# Patient Record
Sex: Female | Born: 1945 | Race: White | Hispanic: No | Smoking: Former smoker
Health system: Southern US, Community
[De-identification: ages and names within clinical notes are randomized; demographics above are authoritative.]

## PROBLEM LIST (undated history)

## (undated) DIAGNOSIS — F329 Major depressive disorder, single episode, unspecified: Secondary | ICD-10-CM

## (undated) DIAGNOSIS — M8580 Other specified disorders of bone density and structure, unspecified site: Secondary | ICD-10-CM

## (undated) DIAGNOSIS — J069 Acute upper respiratory infection, unspecified: Secondary | ICD-10-CM

## (undated) DIAGNOSIS — E049 Nontoxic goiter, unspecified: Secondary | ICD-10-CM

## (undated) DIAGNOSIS — J330 Polyp of nasal cavity: Secondary | ICD-10-CM

## (undated) DIAGNOSIS — M7072 Other bursitis of hip, left hip: Secondary | ICD-10-CM

## (undated) DIAGNOSIS — F419 Anxiety disorder, unspecified: Secondary | ICD-10-CM

## (undated) DIAGNOSIS — M1990 Unspecified osteoarthritis, unspecified site: Secondary | ICD-10-CM

## (undated) DIAGNOSIS — F32A Depression, unspecified: Secondary | ICD-10-CM

## (undated) DIAGNOSIS — N6019 Diffuse cystic mastopathy of unspecified breast: Secondary | ICD-10-CM

## (undated) DIAGNOSIS — E785 Hyperlipidemia, unspecified: Secondary | ICD-10-CM

## (undated) DIAGNOSIS — M7051 Other bursitis of knee, right knee: Secondary | ICD-10-CM

## (undated) DIAGNOSIS — J449 Chronic obstructive pulmonary disease, unspecified: Secondary | ICD-10-CM

## (undated) HISTORY — DX: Nontoxic goiter, unspecified: E04.9

## (undated) HISTORY — PX: OOPHORECTOMY: SHX86

## (undated) HISTORY — DX: Acute upper respiratory infection, unspecified: J06.9

## (undated) HISTORY — DX: Unspecified osteoarthritis, unspecified site: M19.90

## (undated) HISTORY — DX: Diffuse cystic mastopathy of unspecified breast: N60.19

## (undated) HISTORY — DX: Hyperlipidemia, unspecified: E78.5

## (undated) HISTORY — PX: OVARIAN CYST REMOVAL: SHX89

## (undated) HISTORY — DX: Other specified disorders of bone density and structure, unspecified site: M85.80

## (undated) HISTORY — PX: MOLE REMOVAL: SHX2046

## (undated) HISTORY — PX: BREAST BIOPSY: SHX20

---
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
## 2006-10-03 DIAGNOSIS — I10 Essential (primary) hypertension: Secondary | ICD-10-CM

## 2006-10-03 HISTORY — DX: Essential (primary) hypertension: I10

## 2007-09-03 ENCOUNTER — Other Ambulatory Visit: Admission: RE | Admit: 2007-09-03 | Discharge: 2007-09-03 | Payer: Self-pay | Admitting: Internal Medicine

## 2007-09-03 LAB — HM PAP SMEAR

## 2007-09-04 ENCOUNTER — Encounter: Admission: RE | Admit: 2007-09-04 | Discharge: 2007-09-04 | Payer: Self-pay | Admitting: Internal Medicine

## 2007-09-12 ENCOUNTER — Other Ambulatory Visit: Admission: RE | Admit: 2007-09-12 | Discharge: 2007-09-12 | Payer: Self-pay | Admitting: Radiology

## 2007-09-12 ENCOUNTER — Encounter (INDEPENDENT_AMBULATORY_CARE_PROVIDER_SITE_OTHER): Payer: Self-pay | Admitting: Diagnostic Radiology

## 2007-09-12 ENCOUNTER — Encounter: Admission: RE | Admit: 2007-09-12 | Discharge: 2007-09-12 | Payer: Self-pay | Admitting: Internal Medicine

## 2007-10-02 ENCOUNTER — Encounter: Admission: RE | Admit: 2007-10-02 | Discharge: 2007-10-02 | Payer: Self-pay | Admitting: Internal Medicine

## 2007-10-10 ENCOUNTER — Encounter: Admission: RE | Admit: 2007-10-10 | Discharge: 2007-10-10 | Payer: Self-pay | Admitting: Internal Medicine

## 2007-10-23 ENCOUNTER — Encounter: Admission: RE | Admit: 2007-10-23 | Discharge: 2007-10-23 | Payer: Self-pay | Admitting: Internal Medicine

## 2008-10-10 ENCOUNTER — Ambulatory Visit: Payer: Self-pay | Admitting: Internal Medicine

## 2008-11-25 ENCOUNTER — Ambulatory Visit: Payer: Self-pay | Admitting: Internal Medicine

## 2009-04-09 ENCOUNTER — Ambulatory Visit: Payer: Self-pay | Admitting: Internal Medicine

## 2010-01-04 ENCOUNTER — Ambulatory Visit: Payer: Self-pay | Admitting: Internal Medicine

## 2010-02-02 ENCOUNTER — Ambulatory Visit: Payer: Self-pay | Admitting: Internal Medicine

## 2010-07-06 ENCOUNTER — Ambulatory Visit: Payer: Self-pay | Admitting: Internal Medicine

## 2010-07-27 ENCOUNTER — Ambulatory Visit: Payer: Self-pay | Admitting: Internal Medicine

## 2010-08-17 ENCOUNTER — Ambulatory Visit: Payer: Self-pay | Admitting: Internal Medicine

## 2010-09-16 ENCOUNTER — Ambulatory Visit: Payer: Self-pay | Admitting: Internal Medicine

## 2010-10-28 ENCOUNTER — Ambulatory Visit
Admission: RE | Admit: 2010-10-28 | Discharge: 2010-10-28 | Payer: Self-pay | Source: Home / Self Care | Attending: Internal Medicine | Admitting: Internal Medicine

## 2010-12-10 ENCOUNTER — Ambulatory Visit (INDEPENDENT_AMBULATORY_CARE_PROVIDER_SITE_OTHER): Payer: Medicare Other | Admitting: Internal Medicine

## 2010-12-10 DIAGNOSIS — J309 Allergic rhinitis, unspecified: Secondary | ICD-10-CM

## 2010-12-10 DIAGNOSIS — I10 Essential (primary) hypertension: Secondary | ICD-10-CM

## 2011-06-10 ENCOUNTER — Encounter: Payer: Self-pay | Admitting: Internal Medicine

## 2011-06-14 ENCOUNTER — Other Ambulatory Visit: Payer: Medicare Other | Admitting: Internal Medicine

## 2011-06-14 DIAGNOSIS — E785 Hyperlipidemia, unspecified: Secondary | ICD-10-CM

## 2011-06-14 DIAGNOSIS — E039 Hypothyroidism, unspecified: Secondary | ICD-10-CM

## 2011-06-14 DIAGNOSIS — Z0000 Encounter for general adult medical examination without abnormal findings: Secondary | ICD-10-CM

## 2011-06-14 LAB — COMPREHENSIVE METABOLIC PANEL
Alkaline Phosphatase: 77 U/L (ref 39–117)
CO2: 27 mEq/L (ref 19–32)
Calcium: 9.5 mg/dL (ref 8.4–10.5)
Chloride: 106 mEq/L (ref 96–112)
Creat: 0.86 mg/dL (ref 0.50–1.10)
Total Protein: 6.9 g/dL (ref 6.0–8.3)

## 2011-06-14 LAB — CBC WITH DIFFERENTIAL/PLATELET
Basophils Relative: 1 % (ref 0–1)
Eosinophils Absolute: 0.3 10*3/uL (ref 0.0–0.7)
Hemoglobin: 14.5 g/dL (ref 12.0–15.0)
Lymphs Abs: 2.5 10*3/uL (ref 0.7–4.0)
MCH: 29.4 pg (ref 26.0–34.0)
MCV: 91.1 fL (ref 78.0–100.0)
Monocytes Absolute: 0.7 10*3/uL (ref 0.1–1.0)
Neutro Abs: 5.4 10*3/uL (ref 1.7–7.7)
Neutrophils Relative %: 61 % (ref 43–77)
Platelets: 284 10*3/uL (ref 150–400)
RBC: 4.93 MIL/uL (ref 3.87–5.11)
RDW: 14.6 % (ref 11.5–15.5)
WBC: 8.9 10*3/uL (ref 4.0–10.5)

## 2011-06-14 LAB — LIPID PANEL
Cholesterol: 207 mg/dL — ABNORMAL HIGH (ref 0–200)
LDL Cholesterol: 132 mg/dL — ABNORMAL HIGH (ref 0–99)

## 2011-06-16 ENCOUNTER — Encounter: Payer: Self-pay | Admitting: Internal Medicine

## 2011-06-16 ENCOUNTER — Ambulatory Visit (INDEPENDENT_AMBULATORY_CARE_PROVIDER_SITE_OTHER): Payer: Medicare Other | Admitting: Internal Medicine

## 2011-06-16 VITALS — BP 124/72 | HR 72 | Temp 97.8°F | Ht 64.0 in | Wt 140.0 lb

## 2011-06-16 DIAGNOSIS — G43909 Migraine, unspecified, not intractable, without status migrainosus: Secondary | ICD-10-CM | POA: Insufficient documentation

## 2011-06-16 DIAGNOSIS — E785 Hyperlipidemia, unspecified: Secondary | ICD-10-CM

## 2011-06-16 DIAGNOSIS — E559 Vitamin D deficiency, unspecified: Secondary | ICD-10-CM | POA: Insufficient documentation

## 2011-06-16 DIAGNOSIS — M1990 Unspecified osteoarthritis, unspecified site: Secondary | ICD-10-CM

## 2011-06-16 DIAGNOSIS — M8580 Other specified disorders of bone density and structure, unspecified site: Secondary | ICD-10-CM

## 2011-06-16 DIAGNOSIS — M899 Disorder of bone, unspecified: Secondary | ICD-10-CM

## 2011-06-16 DIAGNOSIS — I10 Essential (primary) hypertension: Secondary | ICD-10-CM

## 2011-06-16 DIAGNOSIS — M949 Disorder of cartilage, unspecified: Secondary | ICD-10-CM

## 2011-06-16 DIAGNOSIS — Z0000 Encounter for general adult medical examination without abnormal findings: Secondary | ICD-10-CM

## 2011-06-16 DIAGNOSIS — N6019 Diffuse cystic mastopathy of unspecified breast: Secondary | ICD-10-CM

## 2011-06-16 DIAGNOSIS — E039 Hypothyroidism, unspecified: Secondary | ICD-10-CM

## 2011-06-16 DIAGNOSIS — E049 Nontoxic goiter, unspecified: Secondary | ICD-10-CM

## 2011-06-16 LAB — POCT URINALYSIS DIPSTICK
Glucose, UA: NEGATIVE
Nitrite, UA: NEGATIVE
Spec Grav, UA: 1
Urobilinogen, UA: NEGATIVE
pH, UA: 5

## 2011-06-16 NOTE — Progress Notes (Signed)
  Subjective:    Patient ID: Courtney Ryan, female    DOB: 08-12-1946, 65 y.o.   MRN: [id number]  HPI 65 year old white female native of [place] with history of goiter, hypothyroidism, hypertension, osteoarthritis, headaches, elevated LDL cholesterol, osteopenia, hyperlipidemia, allergic rhinitis, fibrocystic breast disease, vitamin D deficiency. Patient has developed a sunburn over the summer on her anterior neck which still itches. She has triamcinolone cream to use on that 2-3 times daily and if no improvement is to see dermatologist. Says that her joints ache from time to time and she gets relief with naproxen. Okay to take that as long as she has no GI side effects.    Review of Systems  Constitutional: Negative.   HENT: Negative.   Eyes: Negative.   Respiratory: Negative.   Cardiovascular: Negative.   Gastrointestinal: Negative.   Genitourinary: Negative.   Musculoskeletal: Positive for arthralgias.  Neurological: Negative.   Hematological: Negative.   Psychiatric/Behavioral: Negative.        Objective:   Physical Exam  Vitals reviewed. Constitutional: She is oriented to person, place, and time. She appears well-developed and well-nourished. No distress.  HENT:  Head: Normocephalic and atraumatic.  Right Ear: External ear normal.  Left Ear: External ear normal.  Mouth/Throat: Oropharynx is clear and moist.  Eyes: Conjunctivae and EOM are normal. Pupils are equal, round, and reactive to light. Right eye exhibits no discharge. Left eye exhibits no discharge. No scleral icterus.  Neck: Normal range of motion. Neck supple. No JVD present. No thyromegaly present.  Cardiovascular: Normal rate, regular rhythm, normal heart sounds and intact distal pulses.   No murmur heard. Abdominal: Soft. Bowel sounds are normal. She exhibits no distension and no mass. There is no tenderness. There is no rebound and no guarding.  Genitourinary:       Deferred  Musculoskeletal: Normal  range of motion. She exhibits no edema.  Lymphadenopathy:    She has no cervical adenopathy.  Neurological: She is alert and oriented to person, place, and time. She has normal reflexes. No cranial nerve deficit. Coordination normal.  Skin: Skin is warm and dry. Rash noted.       Erythematous anterior neck rash that is papular related to recent sunburn.  Psychiatric: She has a normal mood and affect. Her behavior is normal. Judgment and thought content normal.          Assessment & Plan:  Hypothyroidism-TSH stable on Synthroid 0.088 mg daily  Osteoarthritis-okay to take Naprosyn 500 mg twice daily as needed  Headaches-improved  Elevated LDL cholesterol diet recommended --t  needs to restrict excessive cheese and cream  Fibrocystic breast disease-patient refuses to have mammogram this year  Health maintenance-plans influenza immunization  Allergic rhinitis-stable  Hypertension-stable on Cozaar and amlodipine  Return in 6 months for TSH and fasting lipid panel without liver functions. Does not want to be on statin therapy. She says she needs to have colonoscopy. Last one was done in [place] in 2006. She will let us know when she would like to do this.

## 2011-07-05 ENCOUNTER — Other Ambulatory Visit: Payer: Self-pay | Admitting: *Deleted

## 2011-07-05 MED ORDER — LEVOTHYROXINE SODIUM 88 MCG PO TABS: 88.0000 ug | ORAL_TABLET | Freq: Every day | ORAL | Status: DC

## 2011-09-07 ENCOUNTER — Other Ambulatory Visit: Payer: Self-pay

## 2011-09-07 MED ORDER — ESTROGENS, CONJUGATED 0.625 MG/GM VA CREA: TOPICAL_CREAM | VAGINAL | Status: DC

## 2011-12-15 ENCOUNTER — Encounter: Payer: Self-pay | Admitting: Internal Medicine

## 2011-12-15 ENCOUNTER — Ambulatory Visit (INDEPENDENT_AMBULATORY_CARE_PROVIDER_SITE_OTHER): Payer: Medicare Other | Admitting: Internal Medicine

## 2011-12-15 VITALS — BP 146/90 | HR 76 | Temp 98.4°F | Wt 141.5 lb

## 2011-12-15 DIAGNOSIS — I10 Essential (primary) hypertension: Secondary | ICD-10-CM

## 2011-12-15 DIAGNOSIS — E039 Hypothyroidism, unspecified: Secondary | ICD-10-CM

## 2011-12-15 DIAGNOSIS — E785 Hyperlipidemia, unspecified: Secondary | ICD-10-CM

## 2011-12-15 LAB — LIPID PANEL
Cholesterol: 225 mg/dL — ABNORMAL HIGH (ref 0–200)
VLDL: 51 mg/dL — ABNORMAL HIGH (ref 0–40)

## 2011-12-15 NOTE — Progress Notes (Signed)
  Subjective:    Patient ID: Courtney Ryan, female    DOB: December 08, 1945, 66 y.o.   MRN: [id number]  HPI 66 year old white female native of [place] with history of goiter, hypothyroidism, hypertension, osteoarthritis, headaches, elevated LDL cholesterol, osteopenia, allergic rhinitis, fibrocystic breast disease, vitamin D deficiency. Had physical exam 06/16/2011. Here today for six-month recheck. Patient is concerned about her weight but really only weighs about a pound more than she did in mid September 2012 when she was 140 pounds. She does walk and exercise and watches her diet. Has been on a 1200-calorie diet but gets hungry at times. She wants to weigh 130 pounds again but that may not be possible. Her height is 5 feet 4 inches making her BMI slightly over 24 which is acceptable. No complaints or problems. Tolerating medications fine.    Review of Systems     Objective:   Physical Exam neck supple, no significant thyromegaly; chest clear; cardiac exam regular rate and rhythm normal S1 and S2; extremities without edema.  Fasting labs drawn and are pending        Assessment & Plan:  Hypothyroidism  History of border  Osteopenia  Hypertension  Hyperlipidemia  Plan: Await results of fasting labs and relay them to patient. Return in 6 months for physical exam. Blood pressure rechecked was 130/80 which is acceptable. Continue same medications.

## 2011-12-15 NOTE — Patient Instructions (Signed)
Continue same medications. Return in 6 months for physical exam. 

## 2012-02-06 ENCOUNTER — Other Ambulatory Visit: Payer: Self-pay

## 2012-02-06 MED ORDER — LOSARTAN POTASSIUM 100 MG PO TABS: 100.0000 mg | ORAL_TABLET | Freq: Every day | ORAL | Status: DC

## 2012-02-06 MED ORDER — AMLODIPINE BESYLATE 5 MG PO TABS: 5.0000 mg | ORAL_TABLET | Freq: Every day | ORAL | Status: DC

## 2012-03-27 ENCOUNTER — Other Ambulatory Visit: Payer: Self-pay

## 2012-03-27 MED ORDER — LEVOTHYROXINE SODIUM 88 MCG PO TABS: 88.0000 ug | ORAL_TABLET | Freq: Every day | ORAL | Status: DC

## 2012-06-19 ENCOUNTER — Other Ambulatory Visit: Payer: Medicare Other | Admitting: Internal Medicine

## 2012-06-19 DIAGNOSIS — I10 Essential (primary) hypertension: Secondary | ICD-10-CM

## 2012-06-19 DIAGNOSIS — E039 Hypothyroidism, unspecified: Secondary | ICD-10-CM | POA: Diagnosis not present

## 2012-06-19 DIAGNOSIS — E559 Vitamin D deficiency, unspecified: Secondary | ICD-10-CM

## 2012-06-19 LAB — CBC WITH DIFFERENTIAL/PLATELET
Basophils Relative: 1 % (ref 0–1)
Eosinophils Absolute: 0.3 10*3/uL (ref 0.0–0.7)
Eosinophils Relative: 4 % (ref 0–5)
Hemoglobin: 14.9 g/dL (ref 12.0–15.0)
Lymphocytes Relative: 26 % (ref 12–46)
MCH: 29.2 pg (ref 26.0–34.0)
MCV: 87.7 fL (ref 78.0–100.0)
Monocytes Absolute: 0.6 10*3/uL (ref 0.1–1.0)
Monocytes Relative: 7 % (ref 3–12)
Neutrophils Relative %: 62 % (ref 43–77)
Platelets: 320 10*3/uL (ref 150–400)
WBC: 8.6 10*3/uL (ref 4.0–10.5)

## 2012-06-19 LAB — LIPID PANEL
HDL: 58 mg/dL (ref >39)
LDL Cholesterol: 112 mg/dL — ABNORMAL HIGH (ref 0–99)
VLDL: 25 mg/dL (ref 0–40)

## 2012-06-19 LAB — COMPREHENSIVE METABOLIC PANEL
Alkaline Phosphatase: 72 U/L (ref 39–117)
Creat: 0.93 mg/dL (ref 0.50–1.10)
Potassium: 4.4 mEq/L (ref 3.5–5.3)

## 2012-06-20 LAB — VITAMIN D 25 HYDROXY (VIT D DEFICIENCY, FRACTURES): Vit D, 25-Hydroxy: 32 ng/mL (ref 30–89)

## 2012-06-21 ENCOUNTER — Encounter: Payer: Medicare Other | Admitting: Internal Medicine

## 2012-06-25 ENCOUNTER — Ambulatory Visit (INDEPENDENT_AMBULATORY_CARE_PROVIDER_SITE_OTHER): Payer: Medicare Other | Admitting: Internal Medicine

## 2012-06-25 ENCOUNTER — Encounter: Payer: Self-pay | Admitting: Internal Medicine

## 2012-06-25 VITALS — BP 140/90 | HR 92 | Temp 98.0°F | Ht 63.25 in | Wt 143.0 lb

## 2012-06-25 DIAGNOSIS — Z23 Encounter for immunization: Secondary | ICD-10-CM | POA: Diagnosis not present

## 2012-06-25 DIAGNOSIS — I10 Essential (primary) hypertension: Secondary | ICD-10-CM

## 2012-06-25 DIAGNOSIS — Z0000 Encounter for general adult medical examination without abnormal findings: Secondary | ICD-10-CM | POA: Diagnosis not present

## 2012-06-25 LAB — POCT URINALYSIS DIPSTICK
Bilirubin, UA: NEGATIVE
Glucose, UA: NEGATIVE
Leukocytes, UA: NEGATIVE
Protein, UA: NEGATIVE
Urobilinogen, UA: NEGATIVE

## 2012-06-25 NOTE — Progress Notes (Signed)
Subjective:    Patient ID: Courtney Ryan, female    DOB: 05-18-1946, 66 y.o.   MRN: [id number]  HPI 66 year old female for health maintenance and evaluation of medical problems. And history of hypertension, hyperlipidemia, fibrocystic breast disease, hypothyroidism with history of goiter, history of migraine headaches, osteoarthritis, osteopenia, vitamin D deficiency. Blood pressure is borderline elevated today. She is on amlodipine and losartan. Takes Naprosyn for osteoarthritis. Takes calcium and vitamin D. Is on levothyroxine for thyroid replacement. Takes Claritin for allergic rhinitis. She is a native of [place].  She is allergic to penicillin and sulfa. Penicillin causes vomiting and sulfa causes hives and rash.  1968 had an ovary removed because of a cyst.  1970 had abnormal nevus removed from left calf  Smokes 5 cigarettes daily.  Drinks wine 2 times weekly.  Social history: She and her husband are retired. She has a college degree. No children  Family history: Father died at age 17 of lung cancer. Mother died at age 32 with cancer of the liver in 69. Patient not sure where primary wants.  The patient had colonoscopy in 2006 in [place].  Had right breast biopsy 10/23/2007 which was benign.    Review of Systems  Constitutional: Negative.   HENT: Negative.   Eyes: Negative.   Respiratory: Negative.   Cardiovascular:       History of hypertension  Gastrointestinal: Negative.   Genitourinary: Negative.   Musculoskeletal: Positive for arthralgias.  Neurological:       History of migraine headaches  Hematological: Negative.   Psychiatric/Behavioral: Negative.        Objective:   Physical Exam  Vitals reviewed. Constitutional: She is oriented to person, place, and time. She appears well-developed and well-nourished. No distress.  HENT:  Head: Atraumatic.  Right Ear: External ear normal.  Left Ear: External ear normal.  Nose: Nose normal.    Mouth/Throat: Oropharynx is clear and moist.  Eyes: Conjunctivae normal and EOM are normal. Right eye exhibits no discharge.  Neck: Neck supple. No JVD present. No tracheal deviation present. No thyromegaly present.  Cardiovascular: Normal rate, regular rhythm, normal heart sounds and intact distal pulses.   No murmur heard. Pulmonary/Chest: Effort normal and breath sounds normal. No respiratory distress. She has no wheezes. She has no rales.       Breasts normal female without masses  Abdominal: Soft. Bowel sounds are normal. She exhibits no distension and no mass. There is no tenderness. There is no rebound and no guarding.  Musculoskeletal: Normal range of motion. She exhibits no edema.  Lymphadenopathy:    She has no cervical adenopathy.  Neurological: She is alert and oriented to person, place, and time. She has normal reflexes. She displays normal reflexes. No cranial nerve deficit. Coordination normal.  Skin: Skin is warm and dry. No rash noted. She is not diaphoretic.  Psychiatric: She has a normal mood and affect. Her behavior is normal. Judgment and thought content normal.          Assessment & Plan:  Hypertension-tends to be labile  Hyperlipidemia  Osteopenia  Fibrocystic breast disease  Hypothyroidism with history of goiter  History of migraine headaches  Osteoarthritis  History of vitamin D deficiency  Plan: Continue same medications and return in 6 months       Subjective:   Patient presents for Medicare Annual/Subsequent preventive examination.   Review Past Medical/Family/Social: See above in EPIC   Risk Factors  Current exercise habits: walk- 3 miles a day  Dietary issues discussed: low fat low carb  Cardiac risk factors:Hyperlipidemia  Depression Screen  (Note: if answer to either of the following is "Yes", a more complete depression screening is indicated)   Over the past two weeks, have you felt down, depressed or hopeless? No  Over the  past two weeks, have you felt little interest or pleasure in doing things? No Have you lost interest or pleasure in daily life? No Do you often feel hopeless? No Do you cry easily over simple problems? No   Activities of Daily Living  In your present state of health, do you have any difficulty performing the following activities?:   Driving? No  Managing money? No  Feeding yourself? No  Getting from bed to chair? No  Climbing a flight of stairs? No  Preparing food and eating?: No  Bathing or showering? No  Getting dressed: No  Getting to the toilet? No  Using the toilet:No  Moving around from place to place: No  In the past year have you fallen or had a near fall?:No  Are you sexually active? No  Do you have more than one partner? No   Hearing Difficulties: No  Do you often ask people to speak up or repeat themselves? No  Do you experience ringing or noises in your ears? No  Do you have difficulty understanding soft or whispered voices? No  Do you feel that you have a problem with memory? No Do you often misplace items? No    Home Safety:  Do you have a smoke alarm at your residence? Yes Do you have grab bars in the bathroom? No Do you have throw rugs in your house? Yes   Cognitive Testing  Alert? Yes Normal Appearance?Yes  Oriented to person? Yes Place? Yes  Time? Yes  Recall of three objects? Yes  Can perform simple calculations? Yes  Displays appropriate judgment?Yes  Can read the correct time from a watch face?Yes   List the Names of Other Physician/Practitioners you currently use:  See referral list for the physicians patient is currently seeing. None    Review of Systems:See EPIC   Objective:     General appearance: Appears stated age Head: Normocephalic, without obvious abnormality, atraumatic  Eyes: conj clear, EOMi PEERLA  Ears: normal TM's and external ear canals both ears  Nose: Nares normal. Septum midline. Mucosa normal. No drainage or sinus  tenderness.  Throat: lips, mucosa, and tongue normal; teeth and gums normal  Neck: no adenopathy, no carotid bruit, no JVD, supple, symmetrical, trachea midline and thyroid not enlarged, symmetric, no tenderness/mass/nodules  No CVA tenderness.  Lungs: clear to auscultation bilaterally  Breasts: normal appearance, no masses or tenderness,  Heart: regular rate and rhythm, S1, S2 normal, no murmur, click, rub or gallop  Abdomen: soft, non-tender; bowel sounds normal; no masses, no organomegaly  Musculoskeletal: ROM normal in all joints, no crepitus, no deformity, Normal muscle strengthen. Back  is symmetric, no curvature. Skin: Skin color, texture, turgor normal. No rashes or lesions  Lymph nodes: Cervical, supraclavicular, and axillary nodes normal.  Neurologic: CN 2 -12 Normal, Normal symmetric reflexes. Normal coordination and gait  Psych: Alert & Oriented x 3, Mood appear stable.    Assessment:    Annual wellness medicare exam   Plan:    During the course of the visit the patient was educated and counseled about appropriate screening and preventive services including: Annual mammogram       Patient Instructions (the written plan) was  given to the patient.  Medicare Attestation  I have personally reviewed:  The patient's medical and social history  Their use of alcohol, tobacco or illicit drugs  Their current medications and supplements  The patient's functional ability including ADLs,fall risks, home safety risks, cognitive, and hearing and visual impairment  Diet and physical activities  Evidence for depression or mood disorders  The patient's weight, height, BMI, and visual acuity have been recorded in the chart. I have made referrals, counseling, and provided education to the patient based on review of the above and I have provided the patient with a written personalized care plan for preventive services.

## 2012-06-25 NOTE — Patient Instructions (Addendum)
Continue same meds and return in 6 months. Obtain mammogram

## 2012-07-22 ENCOUNTER — Other Ambulatory Visit: Payer: Self-pay | Admitting: Internal Medicine

## 2012-09-09 ENCOUNTER — Encounter: Payer: Self-pay | Admitting: Internal Medicine

## 2012-09-20 ENCOUNTER — Other Ambulatory Visit: Payer: Self-pay

## 2012-09-20 MED ORDER — LEVOTHYROXINE SODIUM 88 MCG PO TABS: 88.0000 ug | ORAL_TABLET | Freq: Every day | ORAL | Status: DC

## 2012-12-17 ENCOUNTER — Other Ambulatory Visit: Payer: Medicare Other | Admitting: Internal Medicine

## 2012-12-17 DIAGNOSIS — E039 Hypothyroidism, unspecified: Secondary | ICD-10-CM | POA: Diagnosis not present

## 2012-12-17 LAB — LIPID PANEL
Cholesterol: 211 mg/dL — ABNORMAL HIGH (ref 0–200)
HDL: 52 mg/dL (ref >39)
Triglycerides: 136 mg/dL (ref <150)

## 2012-12-24 ENCOUNTER — Other Ambulatory Visit: Payer: Medicare Other | Admitting: Internal Medicine

## 2012-12-25 ENCOUNTER — Encounter: Payer: Self-pay | Admitting: Internal Medicine

## 2012-12-25 ENCOUNTER — Ambulatory Visit (INDEPENDENT_AMBULATORY_CARE_PROVIDER_SITE_OTHER): Payer: Medicare Other | Admitting: Internal Medicine

## 2012-12-25 VITALS — BP 140/88 | HR 88 | Temp 97.8°F | Wt 146.0 lb

## 2012-12-25 DIAGNOSIS — M7918 Myalgia, other site: Secondary | ICD-10-CM

## 2012-12-25 DIAGNOSIS — Z87891 Personal history of nicotine dependence: Secondary | ICD-10-CM

## 2012-12-25 DIAGNOSIS — E039 Hypothyroidism, unspecified: Secondary | ICD-10-CM | POA: Diagnosis not present

## 2012-12-25 DIAGNOSIS — E785 Hyperlipidemia, unspecified: Secondary | ICD-10-CM

## 2012-12-25 DIAGNOSIS — IMO0001 Reserved for inherently not codable concepts without codable children: Secondary | ICD-10-CM

## 2012-12-25 DIAGNOSIS — I10 Essential (primary) hypertension: Secondary | ICD-10-CM

## 2012-12-25 NOTE — Patient Instructions (Addendum)
Take blood pressure 3 times weekly. Call with readings in 2 weeks. Will be concerned if blood pressure stays greater than 135 systolically and greater than 85 diastolically. We may need to add HCTZ to your blood pressure regimen. Smoking cessation counseling given. Order for bone density and mammogram. Colonoscopy will be arranged

## 2012-12-25 NOTE — Progress Notes (Signed)
  Subjective:    Patient ID: Courtney Ryan, female    DOB: 01/27/1946, 67 y.o.   MRN: [id number]  HPI in today to followup on hypothyroidism, hypertension and hyperlipidemia. Blood pressures been fluctuating some. She can't give me exact diastolic readings to say is that systolic readings have been in the 140s. TSH is within normal limits on current dose. With regard to hyperlipidemia, she does not want to be on statin therapy and lipids have increased during this past heart winter. Says that there is a family history of colon cancer in her father. Last mammogram was done in [place] in 2006 showing diverticulosis. Says she has not had a repeat study since moving here so we will arrange for that. Continues to smoke 5 or 6 cigarettes daily. She's been using nicotine patch to try to cut down. Has failed to have mammogram and bone density study so new order given for both today.    Review of Systems     Objective:   Physical Exam no thyromegaly. Chest clear to auscultation. Cardiac exam regular rate and rhythm normal S1 and S2. Neck is supple without JVD thyromegaly or carotid bruits. Extremities without edema        Assessment & Plan:  Hypertension-currently on amlodipine and losartan. May need to add HCTZ for better control. She will check blood pressure readings 3 times weekly and call me with results in 2 weeks.  Hypothyroidism  Hyperlipidemia-patient does not want to be on statin therapy  Family history colon cancer in father. Last mammogram 2006 in [place] showing diverticulosis according the patient. Has not had repeat study since moving here. We'll arrange for repeat colonoscopy with family history.  History of smoking-  currently smoking 5 or 6 cigarettes daily. Trying to cut down and quit. She's been using nicotine patch.

## 2013-01-02 ENCOUNTER — Telehealth: Payer: Self-pay

## 2013-01-03 ENCOUNTER — Telehealth: Payer: Self-pay

## 2013-01-03 NOTE — Telephone Encounter (Signed)
Patient scheduled for an appointment with Dr. Loreta Ave for consultation prior to colonoscopy on 01/10/2013 at 2:30 pm. Informed of this. Records will be faxed

## 2013-01-04 ENCOUNTER — Telehealth: Payer: Self-pay

## 2013-01-04 DIAGNOSIS — M8580 Other specified disorders of bone density and structure, unspecified site: Secondary | ICD-10-CM

## 2013-01-04 NOTE — Telephone Encounter (Signed)
[facility]

## 2013-01-07 ENCOUNTER — Other Ambulatory Visit: Payer: Self-pay | Admitting: Internal Medicine

## 2013-01-07 DIAGNOSIS — N63 Unspecified lump in unspecified breast: Secondary | ICD-10-CM

## 2013-01-10 DIAGNOSIS — K5730 Diverticulosis of large intestine without perforation or abscess without bleeding: Secondary | ICD-10-CM | POA: Diagnosis not present

## 2013-01-10 DIAGNOSIS — K648 Other hemorrhoids: Secondary | ICD-10-CM | POA: Diagnosis not present

## 2013-01-10 DIAGNOSIS — Z1211 Encounter for screening for malignant neoplasm of colon: Secondary | ICD-10-CM | POA: Diagnosis not present

## 2013-01-10 DIAGNOSIS — Z800 Family history of malignant neoplasm of digestive organs: Secondary | ICD-10-CM | POA: Diagnosis not present

## 2013-01-17 ENCOUNTER — Telehealth: Payer: Self-pay | Admitting: Internal Medicine

## 2013-01-17 NOTE — Telephone Encounter (Signed)
Advised patient we would get back with her next week.  Patient verbalizes understanding.

## 2013-01-23 ENCOUNTER — Ambulatory Visit
Admission: RE | Admit: 2013-01-23 | Discharge: 2013-01-23 | Disposition: A | Discharge status: Home / Self Care | Payer: Medicare Other | Source: Ambulatory Visit | Attending: Internal Medicine | Admitting: Internal Medicine

## 2013-01-23 ENCOUNTER — Telehealth: Payer: Self-pay | Admitting: Internal Medicine

## 2013-01-23 ENCOUNTER — Other Ambulatory Visit: Payer: Self-pay | Admitting: Internal Medicine

## 2013-01-23 DIAGNOSIS — R928 Other abnormal and inconclusive findings on diagnostic imaging of breast: Secondary | ICD-10-CM | POA: Diagnosis not present

## 2013-01-23 DIAGNOSIS — M949 Disorder of cartilage, unspecified: Secondary | ICD-10-CM | POA: Diagnosis not present

## 2013-01-23 DIAGNOSIS — N63 Unspecified lump in unspecified breast: Secondary | ICD-10-CM

## 2013-01-23 DIAGNOSIS — M899 Disorder of bone, unspecified: Secondary | ICD-10-CM | POA: Diagnosis not present

## 2013-01-23 NOTE — Telephone Encounter (Signed)
Pt called last week with multiple BP readings showing lability despite being on Norvasc. Advise continue with readings for an additional week then see her for assessment. Please call her with this message.

## 2013-01-24 NOTE — Telephone Encounter (Signed)
Message left for patient to call.

## 2013-01-26 ENCOUNTER — Other Ambulatory Visit: Payer: Self-pay | Admitting: Internal Medicine

## 2013-01-29 NOTE — Telephone Encounter (Signed)
Has not returned any calls

## 2013-03-11 DIAGNOSIS — D126 Benign neoplasm of colon, unspecified: Secondary | ICD-10-CM | POA: Diagnosis not present

## 2013-03-11 DIAGNOSIS — Z800 Family history of malignant neoplasm of digestive organs: Secondary | ICD-10-CM | POA: Diagnosis not present

## 2013-03-11 DIAGNOSIS — Z1211 Encounter for screening for malignant neoplasm of colon: Secondary | ICD-10-CM | POA: Diagnosis not present

## 2013-03-11 DIAGNOSIS — K5730 Diverticulosis of large intestine without perforation or abscess without bleeding: Secondary | ICD-10-CM | POA: Diagnosis not present

## 2013-03-17 ENCOUNTER — Other Ambulatory Visit: Payer: Self-pay | Admitting: Internal Medicine

## 2013-07-01 ENCOUNTER — Other Ambulatory Visit: Payer: Medicare Other | Admitting: Internal Medicine

## 2013-07-01 DIAGNOSIS — Z130 Encounter for screening for diseases of the blood and blood-forming organs and certain disorders involving the immune mechanism: Secondary | ICD-10-CM | POA: Diagnosis not present

## 2013-07-01 DIAGNOSIS — E039 Hypothyroidism, unspecified: Secondary | ICD-10-CM

## 2013-07-01 DIAGNOSIS — M899 Disorder of bone, unspecified: Secondary | ICD-10-CM | POA: Diagnosis not present

## 2013-07-01 DIAGNOSIS — I10 Essential (primary) hypertension: Secondary | ICD-10-CM

## 2013-07-01 DIAGNOSIS — E785 Hyperlipidemia, unspecified: Secondary | ICD-10-CM

## 2013-07-01 LAB — COMPREHENSIVE METABOLIC PANEL
Albumin: 4.2 g/dL (ref 3.5–5.2)
Alkaline Phosphatase: 70 U/L (ref 39–117)
BUN: 19 mg/dL (ref 6–23)
CO2: 32 mEq/L (ref 19–32)
Calcium: 10.2 mg/dL (ref 8.4–10.5)
Chloride: 104 mEq/L (ref 96–112)
Glucose, Bld: 84 mg/dL (ref 70–99)
Potassium: 4.7 mEq/L (ref 3.5–5.3)
Sodium: 142 mEq/L (ref 135–145)
Total Protein: 6.8 g/dL (ref 6.0–8.3)

## 2013-07-01 LAB — CBC WITH DIFFERENTIAL/PLATELET
Basophils Relative: 1 % (ref 0–1)
HCT: 43.2 % (ref 36.0–46.0)
Hemoglobin: 14.7 g/dL (ref 12.0–15.0)
Lymphocytes Relative: 25 % (ref 12–46)
Lymphs Abs: 2.3 10*3/uL (ref 0.7–4.0)
MCHC: 34 g/dL (ref 30.0–36.0)
Monocytes Absolute: 0.8 10*3/uL (ref 0.1–1.0)
Monocytes Relative: 8 % (ref 3–12)
Neutro Abs: 5.9 10*3/uL (ref 1.7–7.7)
Neutrophils Relative %: 64 % (ref 43–77)
RBC: 4.87 MIL/uL (ref 3.87–5.11)

## 2013-07-01 LAB — LIPID PANEL
Cholesterol: 192 mg/dL (ref 0–200)
HDL: 52 mg/dL (ref >39)
LDL Cholesterol: 117 mg/dL — ABNORMAL HIGH (ref 0–99)
Triglycerides: 116 mg/dL (ref <150)

## 2013-07-02 ENCOUNTER — Encounter: Payer: Self-pay | Admitting: Internal Medicine

## 2013-07-02 ENCOUNTER — Other Ambulatory Visit (HOSPITAL_COMMUNITY)
Admission: RE | Admit: 2013-07-02 | Discharge: 2013-07-02 | Disposition: A | Discharge status: Home / Self Care | Payer: Medicare Other | Source: Ambulatory Visit | Attending: Internal Medicine | Admitting: Internal Medicine

## 2013-07-02 ENCOUNTER — Ambulatory Visit (INDEPENDENT_AMBULATORY_CARE_PROVIDER_SITE_OTHER): Payer: Medicare Other | Admitting: Internal Medicine

## 2013-07-02 VITALS — BP 130/82 | HR 76 | Temp 97.9°F | Ht 63.25 in | Wt 140.0 lb

## 2013-07-02 DIAGNOSIS — E039 Hypothyroidism, unspecified: Secondary | ICD-10-CM | POA: Diagnosis not present

## 2013-07-02 DIAGNOSIS — Z124 Encounter for screening for malignant neoplasm of cervix: Secondary | ICD-10-CM | POA: Insufficient documentation

## 2013-07-02 DIAGNOSIS — N898 Other specified noninflammatory disorders of vagina: Secondary | ICD-10-CM | POA: Diagnosis not present

## 2013-07-02 DIAGNOSIS — Z0000 Encounter for general adult medical examination without abnormal findings: Secondary | ICD-10-CM | POA: Diagnosis not present

## 2013-07-02 DIAGNOSIS — Z87898 Personal history of other specified conditions: Secondary | ICD-10-CM

## 2013-07-02 DIAGNOSIS — R609 Edema, unspecified: Secondary | ICD-10-CM

## 2013-07-02 DIAGNOSIS — I10 Essential (primary) hypertension: Secondary | ICD-10-CM | POA: Diagnosis not present

## 2013-07-02 DIAGNOSIS — E049 Nontoxic goiter, unspecified: Secondary | ICD-10-CM

## 2013-07-02 DIAGNOSIS — IMO0001 Reserved for inherently not codable concepts without codable children: Secondary | ICD-10-CM

## 2013-07-02 DIAGNOSIS — M8580 Other specified disorders of bone density and structure, unspecified site: Secondary | ICD-10-CM

## 2013-07-02 DIAGNOSIS — M7918 Myalgia, other site: Secondary | ICD-10-CM

## 2013-07-02 DIAGNOSIS — M899 Disorder of bone, unspecified: Secondary | ICD-10-CM

## 2013-07-02 DIAGNOSIS — E785 Hyperlipidemia, unspecified: Secondary | ICD-10-CM

## 2013-07-02 DIAGNOSIS — Z8639 Personal history of other endocrine, nutritional and metabolic disease: Secondary | ICD-10-CM

## 2013-07-02 DIAGNOSIS — Z8669 Personal history of other diseases of the nervous system and sense organs: Secondary | ICD-10-CM

## 2013-07-02 LAB — POCT WET PREP (WET MOUNT)

## 2013-07-02 LAB — POCT URINALYSIS DIPSTICK
Glucose, UA: NEGATIVE
Ketones, UA: NEGATIVE
Leukocytes, UA: NEGATIVE
Protein, UA: NEGATIVE
Spec Grav, UA: 1.025
Urobilinogen, UA: NEGATIVE

## 2013-07-02 MED ORDER — AMLODIPINE BESYLATE 2.5 MG PO TABS: 2.5000 mg | ORAL_TABLET | Freq: Every day | ORAL | Status: DC

## 2013-07-02 NOTE — Progress Notes (Signed)
Subjective:    Patient ID: Courtney Ryan, female    DOB: 1946-08-29, 67 y.o.   MRN: [id number]  HPI  67 year old White female for health maintenance and evaluation of medical problems. New issues are dependent edema and foul smelling vaginal odor. Not a lot of discharge. No recent PAP smear. Decline flu vaccine. Mammogram up to date.  History of hypertension, hyperlipidemia, fibrocystic breast disease, hypothyroidism with history of goiter, history of migraine headaches, osteoarthritis, osteopenia, vitamin D deficiency.  She is on amlodipine 5 mg daily for hypertension as well as losartan. She takes Naprosyn for osteoarthritis. It is possible dependent edema which is new for her is due to a combination of amlodipine, dependent position and Naprosyn.  History of hypothyroidism for which she takes levothyroxin. Takes Claritin for allergies. Takes calcium and vitamin D.  She is allergic to penicillin and sulfa. Penicillin causes vomiting and sulfa causes hives and rash.  Past medical history: In 1968 she had an ovary removed because of a cyst, in 1970 had abnormal nevus removed from left calf, had colonoscopy in 2006 in [place]. Had right breast biopsy January 2009 which was benign.  Social history: She and her husband are retired. She has a college degree. No children. Smokes 5 cigarettes daily. Drinks wine or couple of times a week.  Family history: Father died at age 71 of lung cancer. Mother died at age 92 with cancer of the liver in 59.    Review of Systems  Constitutional: Negative.   Cardiovascular: Positive for leg swelling.  Genitourinary: Positive for vaginal discharge.  Musculoskeletal: Positive for arthralgias.  All other systems reviewed and are negative.       Objective:   Physical Exam  Vitals reviewed. Constitutional: She is oriented to person, place, and time. She appears well-developed and well-nourished. No distress.  HENT:  Head: Normocephalic and  atraumatic.  Right Ear: External ear normal.  Left Ear: External ear normal.  Mouth/Throat: Oropharynx is clear and moist. No oropharyngeal exudate.  Eyes: Conjunctivae and EOM are normal. Right eye exhibits no discharge. Left eye exhibits no discharge. No scleral icterus.  Neck: Neck supple. No JVD present. No thyromegaly present.  Cardiovascular: Normal rate, regular rhythm, normal heart sounds and intact distal pulses.   No murmur heard. Pulmonary/Chest: Effort normal and breath sounds normal. No respiratory distress. She has no wheezes. She has no rales. She exhibits no tenderness.  Breasts normal female  Abdominal: Soft. Bowel sounds are normal. She exhibits no distension and no mass. There is no tenderness. There is no rebound and no guarding.  Genitourinary: Uterus normal. Vaginal discharge found.  Cervix difficult to identify because of atrophy. Wet prep shows white cells and bacteria and occasional clue cell . Scant vaginal discharge which is white. No odor detected.  Musculoskeletal: Normal range of motion. She exhibits no edema.  Lymphadenopathy:    She has no cervical adenopathy.  Neurological: She is alert and oriented to person, place, and time. No cranial nerve deficit. Coordination normal.  Skin: Skin is warm and dry. No rash noted. She is not diaphoretic.  Psychiatric: She has a normal mood and affect. Her behavior is normal. Judgment and thought content normal.          Assessment & Plan:  Bacterial vaginosis-treated with Flagyl 500 mg twice daily for 7 days followed by a vinegar and water douche  Hypertension-stable  Dependent edema may be related to Naprosyn and amlodipine. She will decrease amlodipine to 2.5  mg daily and followup with me in 4-6 weeks. Continue losartan.  Hypothyroidism stable on thyroid replacement therapy  Hyperlipidemia-stable  History of vitamin D deficiency-on vitamin D supplementation  History of osteoarthritis for which she takes  Naprosyn  History of fibrocystic breast disease-recent mammogram normal  Plan: Return in 4-6 weeks for followup on dependent edema and blood pressure Subjective:   Patient presents for Medicare Annual/Subsequent preventive examination.   Review Past Medical/Family/Social: no change   Risk Factors  Current exercise habits: walk, yard work Dietary issues discussed:   Cardiac risk factors: HTN, no family history  Depression Screen  (Note: if answer to either of the following is "Yes", a more complete depression screening is indicated)   Over the past two weeks, have you felt down, depressed or hopeless? No  Over the past two weeks, have you felt little interest or pleasure in doing things? No Have you lost interest or pleasure in daily life? No Do you often feel hopeless? No Do you cry easily over simple problems? No   Activities of Daily Living  In your present state of health, do you have any difficulty performing the following activities?:   Driving? No  Managing money? No  Feeding yourself? No  Getting from bed to chair? No  Climbing a flight of stairs? No  Preparing food and eating?: No  Bathing or showering? No  Getting dressed: No  Getting to the toilet? No  Using the toilet:No  Moving around from place to place: No  In the past year have you fallen or had a near fall?:No  Are you sexually active? No  Do you have more than one partner? No   Hearing Difficulties: No  Do you often ask people to speak up or repeat themselves? No  Do you experience ringing or noises in your ears? No  Do you have difficulty understanding soft or whispered voices? No  Do you feel that you have a problem with memory? No Do you often misplace items? No    Home Safety:  Do you have a smoke alarm at your residence? Yes Do you have grab bars in the bathroom? no Do you have throw rugs in your house? One or two   Cognitive Testing  Alert? Yes Normal Appearance?Yes  Oriented to  person? Yes Place? Yes  Time? Yes  Recall of three objects? Yes  Can perform simple calculations? Yes  Displays appropriate judgment?Yes  Can read the correct time from a watch face?Yes   List the Names of Other Physician/Practitioners you currently use:  See referral list for the physicians patient is currently seeing.   None  Review of Systems:   Objective:     General appearance: Appears stated age . Head: Normocephalic, without obvious abnormality, atraumatic  Eyes: conj clear, EOMi PEERLA  Ears: normal TM's and external ear canals both ears  Nose: Nares normal. Septum midline. Mucosa normal. No drainage or sinus tenderness.  Throat: lips, mucosa, and tongue normal; teeth and gums normal  Neck: no adenopathy, no carotid bruit, no JVD, supple, symmetrical, trachea midline and thyroid not enlarged, symmetric, no tenderness/mass/nodules  No CVA tenderness.  Lungs: clear to auscultation bilaterally  Breasts: normal appearance, no masses or tenderness Heart: regular rate and rhythm, S1, S2 normal, no murmur, click, rub or gallop  Abdomen: soft, non-tender; bowel sounds normal; no masses, no organomegaly  Musculoskeletal: ROM normal in all joints, no crepitus, no deformity, Normal muscle strengthen. Back  is symmetric, no curvature. Skin: Skin  color, texture, turgor normal. No rashes or lesions  Lymph nodes: Cervical, supraclavicular, and axillary nodes normal.  Neurologic: CN 2 -12 Normal, Normal symmetric reflexes. Normal coordination and gait  Psych: Alert & Oriented x 3, Mood appear stable.    Assessment:    Annual wellness medicare exam   Plan:    During the course of the visit the patient was educated and counseled about appropriate screening and preventive services including:  Annual mammgram Declines flu vaccine Declines Zostavax and pneumococcal immunizations. Colonoscopy up to date      Patient Instructions (the written plan) was given to the patient.   Medicare Attestation  I have personally reviewed:  The patient's medical and social history  Their use of alcohol, tobacco or illicit drugs  Their current medications and supplements  The patient's functional ability including ADLs,fall risks, home safety risks, cognitive, and hearing and visual impairment  Diet and physical activities  Evidence for depression or mood disorders  The patient's weight, height, BMI, and visual acuity have been recorded in the chart. I have made referrals, counseling, and provided education to the patient based on review of the above and I have provided the patient with a written personalized care plan for preventive services.

## 2013-07-02 NOTE — Patient Instructions (Addendum)
Decrease amlodipine from 5-2.5 mg daily. Return in 4-6 weeks.

## 2013-07-04 ENCOUNTER — Other Ambulatory Visit: Payer: Self-pay | Admitting: Internal Medicine

## 2013-07-04 MED ORDER — LEVOTHYROXINE SODIUM 88 MCG PO TABS: 88.0000 ug | ORAL_TABLET | Freq: Every day | ORAL | Status: DC

## 2013-07-04 NOTE — Addendum Note (Signed)
Addended by: Judy Pimple on: 07/04/2013 03:08 PM   Modules accepted: Orders

## 2013-07-16 ENCOUNTER — Other Ambulatory Visit: Payer: Self-pay | Admitting: Internal Medicine

## 2013-08-22 ENCOUNTER — Encounter: Payer: Self-pay | Admitting: Internal Medicine

## 2013-08-22 ENCOUNTER — Ambulatory Visit (INDEPENDENT_AMBULATORY_CARE_PROVIDER_SITE_OTHER): Payer: Medicare Other | Admitting: Internal Medicine

## 2013-08-22 VITALS — BP 158/80 | HR 96 | Temp 97.6°F | Ht 63.0 in | Wt 144.0 lb

## 2013-08-22 DIAGNOSIS — R609 Edema, unspecified: Secondary | ICD-10-CM

## 2013-08-22 DIAGNOSIS — J209 Acute bronchitis, unspecified: Secondary | ICD-10-CM | POA: Diagnosis not present

## 2013-08-22 DIAGNOSIS — I10 Essential (primary) hypertension: Secondary | ICD-10-CM

## 2013-08-22 MED ORDER — AZITHROMYCIN 250 MG PO TABS: ORAL_TABLET | ORAL | Status: DC

## 2013-08-22 MED ORDER — FUROSEMIDE 20 MG PO TABS: 20.0000 mg | ORAL_TABLET | Freq: Every day | ORAL | Status: DC

## 2013-08-22 MED ORDER — NAPROXEN 500 MG PO TABS: 500.0000 mg | ORAL_TABLET | Freq: Two times a day (BID) | ORAL | Status: DC

## 2013-08-22 NOTE — Progress Notes (Signed)
  Subjective:    Patient ID: Courtney Ryan, female    DOB: 10-Jul-1946, 67 y.o.   MRN: [id number]  HPI For followup of dependent edema and hypertension. For about a month she has had URI symptoms. She caught it from a friend. Has had cough sore throat and congestion. Cough is dry. Has rhinorrhea from right nostril. Currently no fever or shaking chills. At last visit, we decreased amlodipine to 2.5 mg daily because of complaint of dependent edema. The edema has improved but her blood pressure is elevated. She is taking losartan night. Probably should take losartan in the mornings.    Review of Systems     Objective:   Physical Exam Skin is warm and dry. Pharynx only very slightly injected. TMs are clear. Neck is supple without adenopathy. Chest clear. Cardiac exam regular rate and rhythm normal S1 and S2. 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 Discontinue amlodipine  Bronchitis-treat with Zithromax Z-PAK  Plan: Return in 3 weeks for office visit and basic metabolic panel  25 minutes spent with patient

## 2013-08-22 NOTE — Patient Instructions (Signed)
Stop amlodipine. Take furosemide daily with losartan. Take Z-pak for bronchitis Return in 3 weeks.

## 2013-09-12 ENCOUNTER — Ambulatory Visit (INDEPENDENT_AMBULATORY_CARE_PROVIDER_SITE_OTHER): Payer: Medicare Other | Admitting: Internal Medicine

## 2013-09-12 ENCOUNTER — Encounter: Payer: Self-pay | Admitting: Internal Medicine

## 2013-09-12 VITALS — BP 130/72 | HR 80 | Temp 97.5°F | Ht 63.0 in | Wt 147.0 lb

## 2013-09-12 DIAGNOSIS — E039 Hypothyroidism, unspecified: Secondary | ICD-10-CM | POA: Diagnosis not present

## 2013-09-12 DIAGNOSIS — R609 Edema, unspecified: Secondary | ICD-10-CM | POA: Diagnosis not present

## 2013-09-12 DIAGNOSIS — I10 Essential (primary) hypertension: Secondary | ICD-10-CM

## 2013-09-12 DIAGNOSIS — E785 Hyperlipidemia, unspecified: Secondary | ICD-10-CM | POA: Diagnosis not present

## 2013-09-12 DIAGNOSIS — R05 Cough: Secondary | ICD-10-CM

## 2013-09-12 LAB — BASIC METABOLIC PANEL
BUN: 20 mg/dL (ref 6–23)
CO2: 28 mEq/L (ref 19–32)
Chloride: 106 mEq/L (ref 96–112)
Creat: 1.1 mg/dL (ref 0.50–1.10)

## 2013-09-12 NOTE — Patient Instructions (Signed)
Continue Lasix. Basic metabolic panel drawn. Return in March for six-month recheck. Call if cough persist after 10 days

## 2013-09-12 NOTE — Progress Notes (Signed)
   Subjective:    Patient ID: Courtney Ryan, female    DOB: October 12, 1945, 67 y.o.   MRN: [id number]  HPI Here today to followup on dependent edema, hypertension, and cough. Was seen November 20 and treated with Lasix 20 mg daily, amlodipine was discontinued. Was given a Zithromax Z-Pak for bronchitis. Still has a bit of a cough but not constantly as she was previously having. Is to call if not better in 10 days. Blood pressure is much better today on Lasix 20 mg daily. She is also on losartan. Basic metabolic panel drawn today.    Review of Systems     Objective:   Physical Exam neck is supple without JVD thyromegaly or carotid bruits. Chest clear to auscultation. Cardiac exam regular rate and rhythm. Edema resolved        Assessment & Plan:  Hypertension-improved with addition of diuretic  Dependent edema-improved with Lasix and stopping amlodipine  Cough-slowly resolving status post treatment with Zithromax Z-PAK  Plan: She had physical exam in September. Return for six-month recheck March 2015 at which time she'll need a basic metabolic panel.

## 2013-09-23 ENCOUNTER — Telehealth: Payer: Self-pay | Admitting: Internal Medicine

## 2013-09-23 DIAGNOSIS — J209 Acute bronchitis, unspecified: Secondary | ICD-10-CM

## 2013-09-23 NOTE — Telephone Encounter (Signed)
Patient called Saturday morning saying her bronchitis had returned. No fever or shaking chills. Just cough. Call in Zithromax Z-Pak take 2 tablets day one followed by 1 tablet days 2 through 5 to [facility].

## 2013-10-25 ENCOUNTER — Telehealth: Payer: Self-pay

## 2013-10-25 MED ORDER — AZITHROMYCIN 250 MG PO TABS: ORAL_TABLET | ORAL | Status: DC

## 2013-10-25 NOTE — Telephone Encounter (Signed)
Patient calls this am with sinus congestion, headache, chills, cough and scratchy throat. States same symptoms she had in the fall, when she was treated with a Z Pak. Ok per verbal order from Dr. Renold Genta to send  A Rx for a Zpak to [address]. She is taking OTC cough syrup.

## 2013-10-30 ENCOUNTER — Telehealth: Payer: Self-pay | Admitting: Internal Medicine

## 2013-10-30 NOTE — Telephone Encounter (Signed)
Patient called to request antibiotic or something to help with headache.  However, patient reminded [place] that she has had 3 antibiotics to treat this upper resp infection and she's still congested, has cough and now headache.  Advised patient that she needs to be seen.  Appt given for 1/29 @ 10:15 a.m.  Patient understands and confirmed appointment.

## 2013-10-31 ENCOUNTER — Ambulatory Visit (INDEPENDENT_AMBULATORY_CARE_PROVIDER_SITE_OTHER): Payer: Medicare Other | Admitting: Internal Medicine

## 2013-10-31 ENCOUNTER — Encounter: Payer: Self-pay | Admitting: Internal Medicine

## 2013-10-31 VITALS — BP 138/78 | HR 88 | Temp 97.8°F | Wt 146.0 lb

## 2013-10-31 DIAGNOSIS — J019 Acute sinusitis, unspecified: Secondary | ICD-10-CM | POA: Diagnosis not present

## 2013-10-31 DIAGNOSIS — J209 Acute bronchitis, unspecified: Secondary | ICD-10-CM

## 2013-10-31 MED ORDER — METHYLPREDNISOLONE (PAK) 4 MG PO TABS: ORAL_TABLET | ORAL | Status: DC

## 2013-10-31 MED ORDER — LEVOFLOXACIN 500 MG PO TABS
500.0000 mg | ORAL_TABLET | Freq: Every day | ORAL | Status: DC
Start: 2013-10-31 — End: 2013-12-24

## 2013-10-31 NOTE — Patient Instructions (Addendum)
Take levaquin with food daily x 10 days. Take Medrol Dosepak. Call if not better in 10 days. Consider allergy evaluation if symptoms persist.

## 2013-10-31 NOTE — Progress Notes (Signed)
   Subjective:    Patient ID: Courtney Ryan, female    DOB: [birth year], 68 y.o.   MRN: [id number]  HPI Patient has had issues with her current sinus infections since Fall 2014. She's taken a total of 3 Z-Paks since November 2014. Themost recent one was was prescribed January 23. She initially had her first Z-Pak prescription November 20. Patient says that the cough and congestion do not completely resolve. She has sinus pressure. Yesterday she said she had temperature of 100 orally. She is afebrile today. No shaking chills. No sore throat. No myalgias. Complaining of some ear pain    Review of Systems     Objective:   Physical Exam left TM obscured by cerumen. Right TM clear. Pharynx slightly injected without exudate. Neck is supple without adenopathy. Chest clear to auscultation. She sounds nasally congested and has a croupy cough.        Assessment & Plan:  Sinusitis  Bronchitis  Plan: Medrol 4 mg 6 day dosepak to take in tapering course as directed. Levaquin 500 milligrams daily for 10 days. If symptoms do not improve, consider allergy evaluation.

## 2013-12-09 ENCOUNTER — Other Ambulatory Visit: Payer: Self-pay | Admitting: Internal Medicine

## 2013-12-19 ENCOUNTER — Ambulatory Visit: Payer: Medicare Other | Admitting: Internal Medicine

## 2013-12-24 ENCOUNTER — Encounter: Payer: Self-pay | Admitting: Internal Medicine

## 2013-12-24 ENCOUNTER — Ambulatory Visit (INDEPENDENT_AMBULATORY_CARE_PROVIDER_SITE_OTHER): Payer: Medicare Other | Admitting: Internal Medicine

## 2013-12-24 VITALS — BP 146/100 | HR 80 | Temp 98.4°F | Wt 146.0 lb

## 2013-12-24 DIAGNOSIS — I10 Essential (primary) hypertension: Secondary | ICD-10-CM | POA: Diagnosis not present

## 2013-12-24 DIAGNOSIS — E039 Hypothyroidism, unspecified: Secondary | ICD-10-CM | POA: Diagnosis not present

## 2013-12-24 LAB — BASIC METABOLIC PANEL
BUN: 23 mg/dL (ref 6–23)
CHLORIDE: 103 meq/L (ref 96–112)
CO2: 30 mEq/L (ref 19–32)
Calcium: 10.2 mg/dL (ref 8.4–10.5)
Creat: 1.02 mg/dL (ref 0.50–1.10)
GLUCOSE: 99 mg/dL (ref 70–99)
Potassium: 4.5 mEq/L (ref 3.5–5.3)
Sodium: 139 mEq/L (ref 135–145)

## 2013-12-24 NOTE — Patient Instructions (Signed)
TSH and B-met drawn. RTC in 4-5 weeks. Add Bystolic to lasix and Losartan. 

## 2013-12-24 NOTE — Progress Notes (Signed)
   Subjective:    Patient ID: Courtney Ryan, female    DOB: Aug 26, 1946, 68 y.o.   MRN: [id number]  HPI  In  today for six-month recheck on hypertension and hypothyroidism. Blood pressure is elevated today but she has not taken diuretic. Currently takes Lasix 20 mg daily for history of hypertension and dependent edema in addition to losartan 100 mg daily. Says blood pressure at home even when she takes diuretic is running 710-626 systolically. Likes it better control. Says dependent edema is under good control with diuretic.    Review of Systems     Objective:   Physical Exam Skin warm and dry. Nodes none. Neck supple without JVD thyromegaly or carotid bruits. Chest clear to auscultation. Cardiac exam regular rate and rhythm normal S1 and S2. Extremities without edema        Assessment & Plan:  Hypertension-control could be better. Continue Lasix and losartan. She was on amlodipine previously but says it caused her legs to swell so we are not going to use that drug. Add Bystolic 5 mg daily to regimen. Samples provided. Return in 4 weeks. Will not need lab work when she returns in 4 weeks.  Hypothyroidism-TSH pending  History of dependent edema-stable on diuretic  Elevated LDL-continue diet exercise  Plan: Return in 4 weeks after adding Bystolic 5 mg daily to Lasix and losartan  25 minutes spent with patient

## 2013-12-25 LAB — TSH: TSH: 0.489 u[IU]/mL (ref 0.350–4.500)

## 2014-01-04 ENCOUNTER — Other Ambulatory Visit: Payer: Self-pay | Admitting: Internal Medicine

## 2014-02-06 ENCOUNTER — Encounter: Payer: Self-pay | Admitting: Internal Medicine

## 2014-02-06 ENCOUNTER — Ambulatory Visit (INDEPENDENT_AMBULATORY_CARE_PROVIDER_SITE_OTHER): Payer: Medicare Other | Admitting: Internal Medicine

## 2014-02-06 VITALS — BP 150/82 | HR 68 | Ht 63.25 in | Wt 146.0 lb

## 2014-02-06 DIAGNOSIS — I10 Essential (primary) hypertension: Secondary | ICD-10-CM

## 2014-02-06 MED ORDER — FUROSEMIDE 40 MG PO TABS: ORAL_TABLET | ORAL | Status: DC

## 2014-02-06 MED ORDER — ATENOLOL 50 MG PO TABS: 50.0000 mg | ORAL_TABLET | Freq: Every day | ORAL | Status: DC

## 2014-02-06 NOTE — Patient Instructions (Signed)
Increase Lasix to 40 mg daily. Take Atenolol 50 mg daily continue Losartan. See you in 6 weeks.

## 2014-02-06 NOTE — Progress Notes (Signed)
   Subjective:    Patient ID: Courtney Ryan, female    DOB: 10-16-1945, 68 y.o.   MRN: [id number]  HPI Blood pressure has been elevated despite having added diastolic 5 mg daily 2 losartan 100 mg daily and Lasix 20 mg daily. DD been swelling a bit too. Says she is intolerant of Norvasc because it caused foot swelling previously. Blood pressure has been running from 962-952 systolically and 84-13 diastolically. Today it is 150/82. She is exercising regularly by walking the dog.    Review of Systems     Objective:   Physical Exam Spent 15 minutes talking with patient about symptoms. Takes losartan and Lasix at 9 AM and Bystolic around noon       Assessment & Plan:  Hypertension  Plan: Increase Lasix to 40 mg daily. Continue losartan 100 mg daily. Add atenolol 50 mg daily. Take atenolol at noon.  Cannot tolerate Norvasc. Discontinue Bystolic Return in 6 weeks.  Check basic metabolic panel when she returns

## 2014-03-25 ENCOUNTER — Ambulatory Visit (INDEPENDENT_AMBULATORY_CARE_PROVIDER_SITE_OTHER): Payer: Medicare Other | Admitting: Internal Medicine

## 2014-03-25 ENCOUNTER — Encounter: Payer: Self-pay | Admitting: Internal Medicine

## 2014-03-25 VITALS — BP 130/80 | HR 72 | Temp 98.7°F | Wt 148.0 lb

## 2014-03-25 DIAGNOSIS — I10 Essential (primary) hypertension: Secondary | ICD-10-CM

## 2014-03-25 LAB — BASIC METABOLIC PANEL
BUN: 28 mg/dL — ABNORMAL HIGH (ref 6–23)
CO2: 27 mEq/L (ref 19–32)
Calcium: 10.1 mg/dL (ref 8.4–10.5)
Chloride: 103 mEq/L (ref 96–112)
Creat: 1.22 mg/dL — ABNORMAL HIGH (ref 0.50–1.10)
GLUCOSE: 104 mg/dL — AB (ref 70–99)
POTASSIUM: 4.4 meq/L (ref 3.5–5.3)
SODIUM: 139 meq/L (ref 135–145)

## 2014-03-25 NOTE — Patient Instructions (Signed)
Cut Tenormin to 25 mg daily and call with BP readings in 2-3 weeks. Basic metabolic panel drawn.

## 2014-03-25 NOTE — Progress Notes (Signed)
   Subjective:    Patient ID: Courtney Ryan, female    DOB: Sep 08, 1946, 68 y.o.   MRN: [id number]  HPI Pt. is on Lasix 40 mg daily, Losartan 100 mg daily, Atenolol 50 mg daily. More LE edema recently but weather has been very hot. Family came from [place] to visit for a couple of weeks. She did not take BP during that time. At last visit, Lasix was increased from 20 to 40 mg daily. She was also started on Tenormin 50 mg daily in place of Bystolic at last visit. Says she feels a little more sluggish but attributes that some 2 musculoskeletal pain. Wants to restart taking Naprosyn daily which is okay with me.    Review of Systems     Objective:   Physical Exam Neck is supple without JVD thyromegaly or carotid bruits. Chest clear to auscultation. Cardiac exam regular rate and rhythm normal S1 and S2. Extremities: 1+ nonpitting edema initially blood pressure was elevated when she arrived at 242 systolically but when I took it it was within normal limits after she sat for a few minutes.         Assessment & Plan:  Hypertension-pulse 72 and stable on beta blocker.  Musculoskeletal pain  Plan: Decrease Tenormin to 25 mg daily and see if sluggishness improves. Call with blood pressure readings in 2-3 weeks and we'll advise further at that time. Basic metabolic panel drawn today and pending. Continue Lasix 40 mg daily, losartan 100 mg daily.  Addendum: B-met shows elevated creatinine. Patient may have been fasting. We'll need to followup to see if she was fasting. If so needs to return for basic metabolic panel when she is well hydrated.

## 2014-03-27 NOTE — Progress Notes (Signed)
Patient to come next week 12/30/13 for another B-Met.

## 2014-04-01 ENCOUNTER — Other Ambulatory Visit (INDEPENDENT_AMBULATORY_CARE_PROVIDER_SITE_OTHER): Payer: Medicare Other | Admitting: Internal Medicine

## 2014-04-01 DIAGNOSIS — I10 Essential (primary) hypertension: Secondary | ICD-10-CM

## 2014-04-01 LAB — BASIC METABOLIC PANEL
BUN: 25 mg/dL — ABNORMAL HIGH (ref 6–23)
CHLORIDE: 100 meq/L (ref 96–112)
CO2: 30 meq/L (ref 19–32)
Calcium: 10.1 mg/dL (ref 8.4–10.5)
Creat: 1.27 mg/dL — ABNORMAL HIGH (ref 0.50–1.10)
GLUCOSE: 93 mg/dL (ref 70–99)
POTASSIUM: 5.1 meq/L (ref 3.5–5.3)
SODIUM: 138 meq/L (ref 135–145)

## 2014-04-02 NOTE — Progress Notes (Signed)
Patient informed. 

## 2014-04-10 ENCOUNTER — Ambulatory Visit (INDEPENDENT_AMBULATORY_CARE_PROVIDER_SITE_OTHER): Payer: Medicare Other | Admitting: Internal Medicine

## 2014-04-10 ENCOUNTER — Encounter: Payer: Self-pay | Admitting: Internal Medicine

## 2014-04-10 VITALS — BP 156/80 | HR 68 | Wt 147.0 lb

## 2014-04-10 DIAGNOSIS — I10 Essential (primary) hypertension: Secondary | ICD-10-CM | POA: Diagnosis not present

## 2014-04-10 DIAGNOSIS — R799 Abnormal finding of blood chemistry, unspecified: Secondary | ICD-10-CM | POA: Diagnosis not present

## 2014-04-10 DIAGNOSIS — R7989 Other specified abnormal findings of blood chemistry: Secondary | ICD-10-CM

## 2014-04-10 LAB — BASIC METABOLIC PANEL WITH GFR
BUN: 22 mg/dL (ref 6–23)
CO2: 31 meq/L (ref 19–32)
Calcium: 10.5 mg/dL (ref 8.4–10.5)
Chloride: 102 meq/L (ref 96–112)
Creat: 1 mg/dL (ref 0.50–1.10)
Glucose, Bld: 100 mg/dL — ABNORMAL HIGH (ref 70–99)
Potassium: 4.3 meq/L (ref 3.5–5.3)
Sodium: 139 meq/L (ref 135–145)

## 2014-04-10 NOTE — Progress Notes (Signed)
   Subjective:    Patient ID: Courtney Ryan, female    DOB: 1946-08-01, 68 y.o.   MRN: [id number]  HPI   For followup of hypertension. On June 23 atenolol was decreased from 50-25 mg daily. She was continued on Lasix 40 mg daily and losartan. She was noted to have an elevated serum creatinine at that time at 1.27 . Repeat serum creatinine done June 30 with patient well hydrated and was 1.22. At that point, losartan was discontinued. She's here today for followup. Blood pressure is elevated on Tenormin 25 mg daily and Lasix 40 mg daily.    Review of Systems     Objective:   Physical Exam  No dependent edema. Chest clear. Cardiac exam regular rate and rhythm.      Assessment & Plan:  Hypertension  Plan: Wait-and-see results of creatinine done today before deciding on further medication.  Spent 20 minutes speaking with patient about these issues. She is a bit frustrated with her blood pressure.

## 2014-04-11 ENCOUNTER — Telehealth: Payer: Self-pay | Admitting: Internal Medicine

## 2014-04-11 ENCOUNTER — Other Ambulatory Visit: Payer: Self-pay

## 2014-04-11 MED ORDER — AMLODIPINE BESYLATE 5 MG PO TABS: 5.0000 mg | ORAL_TABLET | Freq: Every day | ORAL | Status: DC

## 2014-04-11 NOTE — Telephone Encounter (Signed)
Creatinine are now normal off losartan. Continue Lasix 40 mg daily. Continue Tenormin 25 mg daily. Add amlodipine 5 mg daily. She took this a while back before starting Lasix and had dependent edema. Hopefully this will not cause edema since she is on Lasix. She is to followup in 2 weeks.

## 2014-04-24 ENCOUNTER — Ambulatory Visit (INDEPENDENT_AMBULATORY_CARE_PROVIDER_SITE_OTHER): Payer: Medicare Other | Admitting: Internal Medicine

## 2014-04-24 ENCOUNTER — Encounter: Payer: Self-pay | Admitting: Internal Medicine

## 2014-04-24 VITALS — BP 130/78 | HR 72 | Temp 97.8°F | Wt 147.0 lb

## 2014-04-24 DIAGNOSIS — I10 Essential (primary) hypertension: Secondary | ICD-10-CM | POA: Diagnosis not present

## 2014-04-24 DIAGNOSIS — R609 Edema, unspecified: Secondary | ICD-10-CM | POA: Insufficient documentation

## 2014-04-24 NOTE — Patient Instructions (Addendum)
Continue to monitor blood pressure at home. Call if blood pressure is elevated or if swelling in feet does not resolve with cooler weather. Otherwise return in November for physical exam.

## 2014-04-24 NOTE — Progress Notes (Signed)
   Subjective:    Patient ID: Courtney Ryan, female    DOB: 07/22/1946, 68 y.o.   MRN: [id number]  HPI  In today to followup on hypertension. Blood pressure is excellent today at 130/78. She feels well. Still complaining of feet swelling at times and feeling tight. This is despite being on furosemide. I think when hot weather resolves that this will improve. At last visit added amlodipine 5 mg back to her regimen. She complained that amlodipine caused foot swelling in the past but she's now on Lasix.    Review of Systems     Objective:   Physical Exam   Not examined. Blood pressure check. Spent 10 minutes with patient today. There is no pitting edema of her legs.     Assessment & Plan:  Hypertension  Dependent edema  Plan: Call if dependent edema does not resolve with resolving hot weather. We could consider increasing Lasix to twice daily if necessary. Otherwise return in November for physical examination.

## 2014-04-29 LAB — PROTIME-INR: INR: 1 (ref 0.9–1.1)

## 2014-05-26 ENCOUNTER — Encounter: Payer: Self-pay | Admitting: Internal Medicine

## 2014-05-26 ENCOUNTER — Ambulatory Visit (INDEPENDENT_AMBULATORY_CARE_PROVIDER_SITE_OTHER): Payer: Medicare Other | Admitting: Internal Medicine

## 2014-05-26 VITALS — BP 130/66 | HR 76 | Temp 98.7°F | Wt 148.5 lb

## 2014-05-26 DIAGNOSIS — E039 Hypothyroidism, unspecified: Secondary | ICD-10-CM

## 2014-05-26 DIAGNOSIS — E119 Type 2 diabetes mellitus without complications: Secondary | ICD-10-CM

## 2014-05-26 LAB — TSH: TSH: 0.295 u[IU]/mL — ABNORMAL LOW (ref 0.350–4.500)

## 2014-05-26 NOTE — Patient Instructions (Signed)
Watch diet and exercise. Home glucose monitoring once daily. Return for your physical exam which is already been scheduled for November with fasting lab work.

## 2014-05-26 NOTE — Progress Notes (Signed)
   Subjective:    Patient ID: Courtney Ryan, female    DOB: 1946/03/02, 68 y.o.   MRN: [id number]  HPI  Patient is enrolled in a Alzheimer study at [facility]. She heard about it through a friend. They did some lab work which was fasting recently and she had a low TSH which she is concerned about. Her TSH here in March was 0.489 which was considered normal. She also has an elevated hemoglobin A1c of 6.4% at [facility] but has had normal glucoses here with basic metabolic panels in the past few months. She says she does eat a lot of bread. When she was here in [place] did not want to have her cholesterol checked. Now she is surprised by the elevation.  She is not on statin medication. She does have hypothyroidism and is on Synthroid 0.088 mg daily. We're going to repeat her TSH today and see if her dosage to be adjusted. I have written for home glucose monitor. She may check her Accu-Cheks once a day which Medicare will pay for once a day testing. She should check her Accu-Cheks before a meals with various meals during the day. She says she will cut back on carbohydrate intake. Says she's been eating a lot of eggs.    Review of Systems     Objective:   Physical Exam  She was not examined today. I spent 25 minutes speaking with her about these abnormal lab results. Counsel regarding diet and exercise      Assessment & Plan:  New onset type 2 diabetes mellitus-to be treated with diet and exercise  Hypothyroidism-TSH checked today. Currently on Synthroid 0.088 mg daily  Hyperlipidemia-To be treated with diet and exercise  Plan: Patient will watch her diet. She has appointment for complete Medicare physical exam in November and that will be a good time to recheck these values. A1c can be to recheck at that time. She will do home glucose monitoring once a day. We will see a thyroid replacement medication needs to be adjusted based on TSH today.  25 minutes spent with  patient

## 2014-05-27 ENCOUNTER — Other Ambulatory Visit: Payer: Self-pay

## 2014-05-27 MED ORDER — LEVOTHYROXINE SODIUM 75 MCG PO TABS: 75.0000 ug | ORAL_TABLET | Freq: Every day | ORAL | Status: DC

## 2014-05-27 NOTE — Progress Notes (Signed)
Patient informed. Will return 08/10/2014 for TSH only.

## 2014-06-14 ENCOUNTER — Encounter: Payer: Self-pay | Admitting: Internal Medicine

## 2014-06-14 NOTE — Patient Instructions (Addendum)
Wait to see see results of creatinine today before deciding on further medication. May need to restart amlodipine 5 mg daily

## 2014-06-24 ENCOUNTER — Other Ambulatory Visit: Payer: Self-pay | Admitting: Internal Medicine

## 2014-07-10 ENCOUNTER — Other Ambulatory Visit: Payer: Medicare Other | Admitting: Internal Medicine

## 2014-07-10 DIAGNOSIS — E038 Other specified hypothyroidism: Secondary | ICD-10-CM | POA: Diagnosis not present

## 2014-07-10 LAB — TSH: TSH: 0.952 u[IU]/mL (ref 0.350–4.500)

## 2014-07-11 ENCOUNTER — Telehealth: Payer: Self-pay

## 2014-07-11 NOTE — Telephone Encounter (Signed)
Left message informing patient of normal lab results and to continue the same dose of synthroid.

## 2014-07-11 NOTE — Telephone Encounter (Signed)
Message copied by Amado Coe on Fri Jul 11, 2014  9:02 AM ------      [name]      Created: Thu Jul 10, 2014  6:12 PM       Call patient. Normal result. Continue same dose of Synthroid. ------

## 2014-08-02 ENCOUNTER — Other Ambulatory Visit: Payer: Self-pay | Admitting: Internal Medicine

## 2014-08-12 ENCOUNTER — Encounter: Payer: Self-pay | Admitting: Internal Medicine

## 2014-08-15 ENCOUNTER — Other Ambulatory Visit: Payer: Medicare Other | Admitting: Internal Medicine

## 2014-08-15 DIAGNOSIS — Z0000 Encounter for general adult medical examination without abnormal findings: Secondary | ICD-10-CM

## 2014-08-15 DIAGNOSIS — E785 Hyperlipidemia, unspecified: Secondary | ICD-10-CM | POA: Diagnosis not present

## 2014-08-15 DIAGNOSIS — E039 Hypothyroidism, unspecified: Secondary | ICD-10-CM | POA: Diagnosis not present

## 2014-08-15 DIAGNOSIS — I10 Essential (primary) hypertension: Secondary | ICD-10-CM | POA: Diagnosis not present

## 2014-08-15 DIAGNOSIS — E559 Vitamin D deficiency, unspecified: Secondary | ICD-10-CM

## 2014-08-15 DIAGNOSIS — E119 Type 2 diabetes mellitus without complications: Secondary | ICD-10-CM

## 2014-08-15 LAB — COMPREHENSIVE METABOLIC PANEL
ALBUMIN: 4.2 g/dL (ref 3.5–5.2)
ALK PHOS: 75 U/L (ref 39–117)
ALT: 17 U/L (ref 0–35)
AST: 16 U/L (ref 0–37)
BILIRUBIN TOTAL: 0.4 mg/dL (ref 0.2–1.2)
BUN: 21 mg/dL (ref 6–23)
CO2: 25 mEq/L (ref 19–32)
Calcium: 10.1 mg/dL (ref 8.4–10.5)
Chloride: 104 mEq/L (ref 96–112)
Creat: 1.09 mg/dL (ref 0.50–1.10)
Glucose, Bld: 94 mg/dL (ref 70–99)
POTASSIUM: 4.8 meq/L (ref 3.5–5.3)
Sodium: 143 mEq/L (ref 135–145)
Total Protein: 6.8 g/dL (ref 6.0–8.3)

## 2014-08-15 LAB — LIPID PANEL
CHOL/HDL RATIO: 4.4 ratio
CHOLESTEROL: 208 mg/dL — AB (ref 0–200)
HDL: 47 mg/dL (ref >39)
LDL CALC: 128 mg/dL — AB (ref 0–99)
Triglycerides: 166 mg/dL — ABNORMAL HIGH (ref <150)
VLDL: 33 mg/dL (ref 0–40)

## 2014-08-15 LAB — CBC WITH DIFFERENTIAL/PLATELET
BASOS ABS: 0.1 10*3/uL (ref 0.0–0.1)
BASOS PCT: 1 % (ref 0–1)
EOS ABS: 0.2 10*3/uL (ref 0.0–0.7)
Eosinophils Relative: 3 % (ref 0–5)
HCT: 44 % (ref 36.0–46.0)
Hemoglobin: 14.7 g/dL (ref 12.0–15.0)
Lymphocytes Relative: 29 % (ref 12–46)
Lymphs Abs: 2.4 10*3/uL (ref 0.7–4.0)
MCH: 30 pg (ref 26.0–34.0)
MCHC: 33.4 g/dL (ref 30.0–36.0)
MCV: 89.8 fL (ref 78.0–100.0)
Monocytes Absolute: 0.7 10*3/uL (ref 0.1–1.0)
Monocytes Relative: 9 % (ref 3–12)
NEUTROS PCT: 58 % (ref 43–77)
Neutro Abs: 4.8 10*3/uL (ref 1.7–7.7)
Platelets: 301 10*3/uL (ref 150–400)
RBC: 4.9 MIL/uL (ref 3.87–5.11)
RDW: 14.2 % (ref 11.5–15.5)
WBC: 8.2 10*3/uL (ref 4.0–10.5)

## 2014-08-15 LAB — HEMOGLOBIN A1C
Hgb A1c MFr Bld: 6.2 % — ABNORMAL HIGH (ref <5.7)
Mean Plasma Glucose: 131 mg/dL — ABNORMAL HIGH (ref <117)

## 2014-08-15 LAB — TSH: TSH: 0.714 u[IU]/mL (ref 0.350–4.500)

## 2014-08-16 LAB — VITAMIN D 25 HYDROXY (VIT D DEFICIENCY, FRACTURES): VIT D 25 HYDROXY: 35 ng/mL (ref 30–89)

## 2014-08-18 ENCOUNTER — Ambulatory Visit (INDEPENDENT_AMBULATORY_CARE_PROVIDER_SITE_OTHER): Payer: Medicare Other | Admitting: Internal Medicine

## 2014-08-18 ENCOUNTER — Encounter: Payer: Self-pay | Admitting: Internal Medicine

## 2014-08-18 VITALS — BP 120/80 | HR 65 | Temp 97.8°F | Ht 63.25 in | Wt 152.0 lb

## 2014-08-18 DIAGNOSIS — I10 Essential (primary) hypertension: Secondary | ICD-10-CM

## 2014-08-18 DIAGNOSIS — J0100 Acute maxillary sinusitis, unspecified: Secondary | ICD-10-CM

## 2014-08-18 DIAGNOSIS — N6019 Diffuse cystic mastopathy of unspecified breast: Secondary | ICD-10-CM | POA: Diagnosis not present

## 2014-08-18 DIAGNOSIS — M8580 Other specified disorders of bone density and structure, unspecified site: Secondary | ICD-10-CM | POA: Diagnosis not present

## 2014-08-18 DIAGNOSIS — E039 Hypothyroidism, unspecified: Secondary | ICD-10-CM

## 2014-08-18 DIAGNOSIS — M150 Primary generalized (osteo)arthritis: Secondary | ICD-10-CM

## 2014-08-18 DIAGNOSIS — M159 Polyosteoarthritis, unspecified: Secondary | ICD-10-CM

## 2014-08-18 DIAGNOSIS — E785 Hyperlipidemia, unspecified: Secondary | ICD-10-CM | POA: Diagnosis not present

## 2014-08-18 DIAGNOSIS — R609 Edema, unspecified: Secondary | ICD-10-CM

## 2014-08-18 DIAGNOSIS — Z8639 Personal history of other endocrine, nutritional and metabolic disease: Secondary | ICD-10-CM | POA: Diagnosis not present

## 2014-08-18 DIAGNOSIS — Z0000 Encounter for general adult medical examination without abnormal findings: Secondary | ICD-10-CM | POA: Diagnosis not present

## 2014-08-18 DIAGNOSIS — E8881 Metabolic syndrome: Secondary | ICD-10-CM | POA: Diagnosis not present

## 2014-08-18 DIAGNOSIS — E119 Type 2 diabetes mellitus without complications: Secondary | ICD-10-CM | POA: Diagnosis not present

## 2014-08-18 LAB — POCT URINALYSIS DIPSTICK
Bilirubin, UA: NEGATIVE
Blood, UA: NEGATIVE
Glucose, UA: NEGATIVE
KETONES UA: NEGATIVE
LEUKOCYTES UA: NEGATIVE
Nitrite, UA: NEGATIVE
PH UA: 5
PROTEIN UA: NEGATIVE
Spec Grav, UA: 1.015
UROBILINOGEN UA: NEGATIVE

## 2014-08-18 MED ORDER — LEVOFLOXACIN 500 MG PO TABS: 500.0000 mg | ORAL_TABLET | Freq: Every day | ORAL | Status: DC

## 2014-08-18 NOTE — Patient Instructions (Signed)
Watch sugar in diet. Keep low fat diet. Diet and exercise. Have eye exam. Have mammogram. Take levaquin for sinusitis. RTC in 6 months.

## 2014-08-19 LAB — MICROALBUMIN, URINE: Microalb, Ur: 0.3 mg/dL (ref <2.0)

## 2014-08-25 ENCOUNTER — Other Ambulatory Visit: Payer: Self-pay | Admitting: Internal Medicine

## 2014-08-25 DIAGNOSIS — Z1231 Encounter for screening mammogram for malignant neoplasm of breast: Secondary | ICD-10-CM

## 2014-09-08 ENCOUNTER — Telehealth: Payer: Self-pay

## 2014-09-08 NOTE — Telephone Encounter (Signed)
Left message for patient to call and let us know if they have received flu vaccine.

## 2014-09-29 ENCOUNTER — Other Ambulatory Visit: Payer: Self-pay | Admitting: Internal Medicine

## 2014-10-06 ENCOUNTER — Encounter (INDEPENDENT_AMBULATORY_CARE_PROVIDER_SITE_OTHER): Payer: Self-pay

## 2014-10-06 ENCOUNTER — Ambulatory Visit
Admission: RE | Admit: 2014-10-06 | Discharge: 2014-10-06 | Disposition: A | Discharge status: Home / Self Care | Payer: Medicare Other | Source: Ambulatory Visit | Attending: Internal Medicine | Admitting: Internal Medicine

## 2014-10-06 DIAGNOSIS — Z1231 Encounter for screening mammogram for malignant neoplasm of breast: Secondary | ICD-10-CM

## 2014-10-14 ENCOUNTER — Encounter: Payer: Self-pay | Admitting: Internal Medicine

## 2014-10-14 ENCOUNTER — Ambulatory Visit (INDEPENDENT_AMBULATORY_CARE_PROVIDER_SITE_OTHER): Payer: Medicare Other | Admitting: Internal Medicine

## 2014-10-14 VITALS — BP 138/78 | HR 72 | Temp 97.8°F | Wt 150.0 lb

## 2014-10-14 DIAGNOSIS — J209 Acute bronchitis, unspecified: Secondary | ICD-10-CM | POA: Diagnosis not present

## 2014-10-14 DIAGNOSIS — J0100 Acute maxillary sinusitis, unspecified: Secondary | ICD-10-CM

## 2014-10-14 MED ORDER — METHYLPREDNISOLONE 4 MG PO TABS: ORAL_TABLET | ORAL | Status: DC

## 2014-10-14 MED ORDER — LEVOFLOXACIN 500 MG PO TABS: 500.0000 mg | ORAL_TABLET | Freq: Every day | ORAL | Status: DC

## 2014-10-14 NOTE — Progress Notes (Signed)
   Subjective:    Patient ID: Courtney Ryan, female    DOB: 26-Mar-1946, 69 y.o.   MRN: [id number]  HPI  Patient was here November 16 for physical exam and was treated with Levaquin 500 mg for 10 days for respiratory infection. She says this has persisted. She is coughing at night and during the day. Says she cannot breathe out of her right nostril. Cough is congested. No fever or shaking chills.    Review of Systems     Objective:   Physical Exam   She has a  pustule inside right medial nostril. Mucosa is boggy. Left nasal mucosa is clear. Pharynx is only very slightly injected. Neck supple. Chest clear without rales or wheezing. Has a deep congested cough. Skin warm and dry. TMs are clear.      Assessment & Plan:   Acute sinusitis  Bronchitis  Plan: Last January 2015 we gave her Levaquin in addition to Medrol and that seemed to help great deal. Today prescribe Levaquin 500 milligrams daily for 10 days and a six-day Medrol Dosepak.

## 2014-10-14 NOTE — Patient Instructions (Addendum)
Take Medrol taper as directed and levaquin 500 mg daily x 10 days.

## 2014-11-28 ENCOUNTER — Encounter: Payer: Self-pay | Admitting: Internal Medicine

## 2014-11-28 NOTE — Progress Notes (Signed)
Subjective:    Patient ID: Courtney Ryan, female    DOB: [birth year], 69 y.o.   MRN: [id number]  HPI  69 year old white female in today for health maintenance exam and evaluation of medical problems. History of dependent edema, hypertension, hyperlipidemia, fibrocystic breast disease, hypothyroidism with history of goiter, history of migraine headaches, osteoarthritis, osteopenia, vitamin D deficiency.  Also has a sinus infection today.  She is allergic to penicillin and sulfa. Penicillin causes vomiting. Sulfa causes hives and rash.  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 In 1970 she had abnormal nevus removed from left calf. Had colonoscopy in 2006 in [place]. Had right breast biopsy January 2009 which was benign.  Social history: She and her husband are retired. She has a college degree. No children. Smokes 5 cigarettes daily. Drinks wine a couple of times a week.  Family history: Father died at age 64 of lung cancer. Mother died at age 41 with cancer of the liver in 2.    Review of Systems complains of maxillary sinus pressure and congestion     Objective:   Physical Exam  Constitutional: She is oriented to person, place, and time. She appears well-developed and well-nourished. No distress.  HENT:  Head: Normocephalic and atraumatic.  Right Ear: External ear normal.  Left Ear: External ear normal.  Mouth/Throat: Oropharynx is clear and moist. No oropharyngeal exudate.  Eyes: Conjunctivae and EOM are normal. Pupils are equal, round, and reactive to light. Right eye exhibits no discharge. Left eye exhibits no discharge. No scleral icterus.  Neck: Neck supple. No JVD present. No tracheal deviation present. No thyromegaly present.  Cardiovascular: Normal rate.   No murmur heard. Pulmonary/Chest: Effort normal and breath sounds normal. No respiratory distress. She has no wheezes. She has no rales.  Abdominal: Soft. Bowel sounds are  normal. She exhibits no distension and no mass. There is no tenderness. There is no rebound and no guarding.  Genitourinary:  Pap done in 2014. Bimanual normal.  Musculoskeletal: She exhibits no edema.  Neurological: She is alert and oriented to person, place, and time. She has normal reflexes. No cranial nerve deficit. Coordination normal.  Skin: Skin is warm and dry. No rash noted. She is not diaphoretic.  Psychiatric: She has a normal mood and affect. Her behavior is normal. Judgment normal.  Vitals reviewed.         Assessment & Plan:  Acute sinusitis-Levaquin 500 milligrams daily for 10 days  Hypertension-stable on treatment  Dependent edema-stable  Hypothyroidism-stable on thyroid replacement therapy  Hyperlipidemia-stable  History of vitamin D deficiency-currently on vitamin D supplementation  History of osteoarthritis for which she takes Naprosyn  History of fibrocystic breast disease  Metabolic syndrome  Type 2 diabetes mellitus -hemoglobin A1c 6.2%-treat with diet and exercise and recheck in 6 months. Monitor Accu-Cheks at home.  Plan: Return in 6 months or as needed.  Subjective:   Patient presents for Medicare Annual/Subsequent preventive examination.  Review Past Medical/Family/Social: See above   Risk Factors  Current exercise habits: Light exercise Dietary issues discussed: Low fat low carb  Cardiac risk factors: Hyperlipidemia, diabetes mellitus, hypertension, smoking  Depression Screen  (Note: if answer to either of the following is "Yes", a more complete depression screening is indicated)   Over the past two weeks, have you felt down, depressed or hopeless? No  Over the past two weeks, have you felt little interest or pleasure in doing things? No Have you  lost interest or pleasure in daily life? No Do you often feel hopeless? No Do you cry easily over simple problems? No   Activities of Daily Living  In your present state of health, do you  have any difficulty performing the following activities?:   Driving? No  Managing money? No  Feeding yourself? No  Getting from bed to chair? No  Climbing a flight of stairs? No  Preparing food and eating?: No  Bathing or showering? No  Getting dressed: No  Getting to the toilet? No  Using the toilet:No  Moving around from place to place: No  In the past year have you fallen or had a near fall?:No  Are you sexually active? No  Do you have more than one partner? No   Hearing Difficulties: No  Do you often ask people to speak up or repeat themselves? No  Do you experience ringing or noises in your ears? No  Do you have difficulty understanding soft or whispered voices? No  Do you feel that you have a problem with memory? No Do you often misplace items? No    Home Safety:  Do you have a smoke alarm at your residence? Yes Do you have grab bars in the bathroom? No Do you have throw rugs in your house? No   Cognitive Testing  Alert? Yes Normal Appearance?Yes  Oriented to person? Yes Place? Yes  Time? Yes  Recall of three objects? Yes  Can perform simple calculations? Yes  Displays appropriate judgment?Yes  Can read the correct time from a watch face?Yes   List the Names of Other Physician/Practitioners you currently use:  See referral list for the physicians patient is currently seeing.     Review of Systems: See above   Objective:     General appearance: Appears stated age and mildly obese  Head: Normocephalic, without obvious abnormality, atraumatic  Eyes: conj clear, EOMi PEERLA  Ears: normal TM's and external ear canals both ears  Nose: Nares normal. Septum midline. Mucosa normal. No drainage or sinus tenderness.  Throat: lips, mucosa, and tongue normal; teeth and gums normal  Neck: no adenopathy, no carotid bruit, no JVD, supple, symmetrical, trachea midline and thyroid not enlarged, symmetric, no tenderness/mass/nodules  No CVA tenderness.  Lungs: clear to  auscultation bilaterally  Breasts: normal appearance, no masses or tenderness Heart: regular rate and rhythm, S1, S2 normal, no murmur, click, rub or gallop  Abdomen: soft, non-tender; bowel sounds normal; no masses, no organomegaly  Musculoskeletal: ROM normal in all joints, no crepitus, no deformity, Normal muscle strengthen. Back  is symmetric, no curvature. Skin: Skin color, texture, turgor normal. No rashes or lesions  Lymph nodes: Cervical, supraclavicular, and axillary nodes normal.  Neurologic: CN 2 -12 Normal, Normal symmetric reflexes. Normal coordination and gait  Psych: Alert & Oriented x 3, Mood appear stable.    Assessment:    Annual wellness medicare exam   Plan:    During the course of the visit the patient was educated and counseled about appropriate screening and preventive services including:   Annual mammogram  Bone density study every 2-3 years  Annuli exam     Patient Instructions (the written plan) was given to the patient.  Medicare Attestation  I have personally reviewed:  The patient's medical and social history  Their use of alcohol, tobacco or illicit drugs  Their current medications and supplements  The patient's functional ability including ADLs,fall risks, home safety risks, cognitive, and hearing and visual impairment  Diet and physical activities  Evidence for depression or mood disorders  The patient's weight, height, BMI, and visual acuity have been recorded in the chart. I have made referrals, counseling, and provided education to the patient based on review of the above and I have provided the patient with a written personalized care plan for preventive services.

## 2014-12-13 ENCOUNTER — Other Ambulatory Visit: Payer: Self-pay | Admitting: Internal Medicine

## 2015-01-20 ENCOUNTER — Other Ambulatory Visit: Payer: Self-pay | Admitting: Internal Medicine

## 2015-01-26 ENCOUNTER — Other Ambulatory Visit: Payer: Self-pay | Admitting: Internal Medicine

## 2015-01-30 ENCOUNTER — Ambulatory Visit (INDEPENDENT_AMBULATORY_CARE_PROVIDER_SITE_OTHER): Payer: Medicare Other | Admitting: Internal Medicine

## 2015-01-30 ENCOUNTER — Encounter: Payer: Self-pay | Admitting: Internal Medicine

## 2015-01-30 VITALS — BP 132/74 | HR 68 | Temp 98.2°F | Wt 149.5 lb

## 2015-01-30 DIAGNOSIS — I10 Essential (primary) hypertension: Secondary | ICD-10-CM | POA: Diagnosis not present

## 2015-01-30 DIAGNOSIS — D2339 Other benign neoplasm of skin of other parts of face: Secondary | ICD-10-CM

## 2015-01-30 DIAGNOSIS — J0100 Acute maxillary sinusitis, unspecified: Secondary | ICD-10-CM | POA: Diagnosis not present

## 2015-01-30 MED ORDER — NAPROXEN 500 MG PO TABS: 500.0000 mg | ORAL_TABLET | Freq: Two times a day (BID) | ORAL | Status: DC

## 2015-01-30 MED ORDER — METHYLPREDNISOLONE 4 MG PO TABS
ORAL_TABLET | ORAL | Status: DC
Start: 2015-01-30 — End: 2015-02-16

## 2015-01-30 MED ORDER — ATENOLOL 25 MG PO TABS: 25.0000 mg | ORAL_TABLET | Freq: Every day | ORAL | Status: DC

## 2015-01-30 MED ORDER — LEVOFLOXACIN 500 MG PO TABS: 500.0000 mg | ORAL_TABLET | Freq: Every day | ORAL | Status: DC

## 2015-01-30 NOTE — Patient Instructions (Signed)
See dermatologist regarding lesion tip of right nostril. Levaquin 500 milligrams daily for 10 days. Medrol 4 mg 6 day dosepak take as directed. Prescription changed from Tenormin 50 mg one half tablet daily to 25 mg daily

## 2015-01-30 NOTE — Progress Notes (Signed)
   Subjective:    Patient ID: Courtney Ryan, female    DOB: [birth year], 69 y.o.   MRN: [id number]  HPI 2 week history of URI symptoms. Has right sinus pressure. May want to consider allergy testing because she has this from time to time. Gave her name of allergist. Some postnasal drip. No cough. No sore throat. No fever. Feels that this is allergy related.  With regard to blood pressure, she's taking Tenormin 25 mg daily. Would like prescription for 25 instead of 50 mg which was provided.    Review of Systems     Objective:   Physical Exam  TM slightly full bilaterally. Pharynx clear. Neck supple. Chest clear to auscultation without rales or wheezing. Blood pressure stable on present regimen. Has lesion on tip of right nostril that is a bit pearly and has some telangectasia. 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 Consider allergy testing. Continue same antihypertensive. Tenormin changed 25 mg daily instead of one half of a 50 mg tablet daily. Levaquin 500 milligrams daily for 10 days. Medrol 4 mg 6 day dosepak.

## 2015-02-06 ENCOUNTER — Other Ambulatory Visit: Payer: Medicare Other | Admitting: Internal Medicine

## 2015-02-06 DIAGNOSIS — E785 Hyperlipidemia, unspecified: Secondary | ICD-10-CM

## 2015-02-06 DIAGNOSIS — E119 Type 2 diabetes mellitus without complications: Secondary | ICD-10-CM | POA: Diagnosis not present

## 2015-02-06 DIAGNOSIS — E039 Hypothyroidism, unspecified: Secondary | ICD-10-CM

## 2015-02-06 LAB — HEMOGLOBIN A1C
HEMOGLOBIN A1C: 6.2 % — AB (ref <5.7)
Mean Plasma Glucose: 131 mg/dL — ABNORMAL HIGH (ref <117)

## 2015-02-06 LAB — LIPID PANEL
CHOL/HDL RATIO: 4.5 ratio
Cholesterol: 216 mg/dL — ABNORMAL HIGH (ref 0–200)
HDL: 48 mg/dL (ref >=46)
LDL CALC: 123 mg/dL — AB (ref 0–99)
Triglycerides: 225 mg/dL — ABNORMAL HIGH (ref <150)
VLDL: 45 mg/dL — ABNORMAL HIGH (ref 0–40)

## 2015-02-06 LAB — TSH: TSH: 0.978 u[IU]/mL (ref 0.350–4.500)

## 2015-02-09 ENCOUNTER — Ambulatory Visit: Payer: Medicare Other | Admitting: Internal Medicine

## 2015-02-16 ENCOUNTER — Ambulatory Visit (INDEPENDENT_AMBULATORY_CARE_PROVIDER_SITE_OTHER): Payer: Medicare Other | Admitting: Internal Medicine

## 2015-02-16 ENCOUNTER — Telehealth: Payer: Self-pay | Admitting: *Deleted

## 2015-02-16 ENCOUNTER — Ambulatory Visit
Admission: RE | Admit: 2015-02-16 | Discharge: 2015-02-16 | Disposition: A | Discharge status: Home / Self Care | Payer: Medicare Other | Source: Ambulatory Visit | Attending: Internal Medicine | Admitting: Internal Medicine

## 2015-02-16 ENCOUNTER — Encounter: Payer: Self-pay | Admitting: Internal Medicine

## 2015-02-16 VITALS — BP 138/84 | HR 85 | Temp 97.9°F | Ht 63.0 in | Wt 152.0 lb

## 2015-02-16 DIAGNOSIS — I10 Essential (primary) hypertension: Secondary | ICD-10-CM | POA: Diagnosis not present

## 2015-02-16 DIAGNOSIS — J320 Chronic maxillary sinusitis: Secondary | ICD-10-CM

## 2015-02-16 DIAGNOSIS — E119 Type 2 diabetes mellitus without complications: Secondary | ICD-10-CM

## 2015-02-16 DIAGNOSIS — J309 Allergic rhinitis, unspecified: Secondary | ICD-10-CM | POA: Diagnosis not present

## 2015-02-16 DIAGNOSIS — F411 Generalized anxiety disorder: Secondary | ICD-10-CM

## 2015-02-16 DIAGNOSIS — J3489 Other specified disorders of nose and nasal sinuses: Secondary | ICD-10-CM | POA: Diagnosis not present

## 2015-02-16 MED ORDER — ALPRAZOLAM 0.25 MG PO TABS: 0.2500 mg | ORAL_TABLET | Freq: Two times a day (BID) | ORAL | Status: DC | PRN

## 2015-02-16 NOTE — Progress Notes (Signed)
   Subjective:    Patient ID: Courtney Ryan, female    DOB: December 14, 1945, 69 y.o.   MRN: [id number]  HPI  She is here today for regular six-month recheck. Still complaining of sinusitis symptoms with drainage right nostril. At last visit, was treated with Levaquin 500 milligrams daily for 10 days and a Medrol Dosepak for 6 days. Recommended allergy testing.  Since she is still symptomatic, will order sinus x-rays. Have obtained  appointment with allergist for her tomorrow.  With regard to hyperlipidemia, triglycerides are 225 and previously were 166. She has been on steroids recently. She has a history of hypertension. She has not wanted to be on statin medication previously. She has a history of hypothyroidism and controlled type 2 diabetes mellitus. Hemoglobin A1c is stable at 6.2%. TSH is within normal limits.   New issue today is flushing before public speaking. She recently had to give a speech and was late. When she got there she was very flushed. I think it has to do with anxiety. Have prescribed small dose of Xanax for her to take an hour before public speaking.     Review of Systems     Objective:   Physical Exam  Skin warm and dry. Nodes none. TMs and pharynx are clear. Neck supple. Chest clear to auscultation without rales or wheezing.      Assessment & Plan:  Controlled type 2 diabetes mellitus-stable  Hyperlipidemia-elevated triglycerides. Not on statin medication  Chronic sinusitis-sinus films are pending  Probable allergic rhinitis  Essential hypertension-blood pressure slightly elevated today but I think she doesn't feel well. We'll repeat in 3 months.  Hypothyroidism-TSH stable on thyroid replacement therapy  Anxiety with public speaking-prescribed low dose Xanax  Plan: Appointment for allergy testing tomorrow. Sinus films to be ordered today. Repeat lipid panel in 3 months as well as blood pressure check.  Addendum: Patient has evidence of sinusitis on sinus  films and a mucus retention cyst in the right maxillary sinus.

## 2015-02-16 NOTE — Patient Instructions (Addendum)
Allergy consult. Recheck lipid in 3 months. Continue same meds. Take xanax before public speaking. Watch diet and exercise.

## 2015-02-17 DIAGNOSIS — R062 Wheezing: Secondary | ICD-10-CM | POA: Diagnosis not present

## 2015-02-17 DIAGNOSIS — Z720 Tobacco use: Secondary | ICD-10-CM | POA: Diagnosis not present

## 2015-02-17 DIAGNOSIS — J309 Allergic rhinitis, unspecified: Secondary | ICD-10-CM | POA: Diagnosis not present

## 2015-02-17 DIAGNOSIS — J329 Chronic sinusitis, unspecified: Secondary | ICD-10-CM | POA: Diagnosis not present

## 2015-02-17 LAB — MICROALBUMIN / CREATININE URINE RATIO
CREATININE, URINE: 141.6 mg/dL
Microalb Creat Ratio: 4.9 mg/g (ref 0.0–30.0)
Microalb, Ur: 0.7 mg/dL (ref <2.0)

## 2015-02-17 NOTE — Telephone Encounter (Signed)
Reviewed x ray results with patient

## 2015-04-12 ENCOUNTER — Other Ambulatory Visit: Payer: Self-pay | Admitting: Internal Medicine

## 2015-05-18 ENCOUNTER — Other Ambulatory Visit: Payer: Medicare Other | Admitting: Internal Medicine

## 2015-05-18 NOTE — Addendum Note (Signed)
Addended by: Leota Jacobsen on: 05/18/2015 09:24 AM   Modules accepted: Orders

## 2015-05-19 ENCOUNTER — Ambulatory Visit: Payer: Medicare Other | Admitting: Internal Medicine

## 2015-05-19 ENCOUNTER — Other Ambulatory Visit: Payer: Medicare Other | Admitting: Internal Medicine

## 2015-05-19 DIAGNOSIS — E785 Hyperlipidemia, unspecified: Secondary | ICD-10-CM

## 2015-05-19 LAB — LIPID PANEL
Cholesterol: 204 mg/dL — ABNORMAL HIGH (ref 125–200)
HDL: 44 mg/dL — AB (ref >=46)
LDL CALC: 128 mg/dL (ref <130)
Total CHOL/HDL Ratio: 4.6 Ratio (ref <=5.0)
Triglycerides: 161 mg/dL — ABNORMAL HIGH (ref <150)
VLDL: 32 mg/dL — AB (ref <30)

## 2015-05-21 ENCOUNTER — Encounter: Payer: Self-pay | Admitting: Internal Medicine

## 2015-05-21 ENCOUNTER — Ambulatory Visit (INDEPENDENT_AMBULATORY_CARE_PROVIDER_SITE_OTHER): Payer: Medicare Other | Admitting: Internal Medicine

## 2015-05-21 ENCOUNTER — Ambulatory Visit
Admission: RE | Admit: 2015-05-21 | Discharge: 2015-05-21 | Disposition: A | Discharge status: Home / Self Care | Payer: Medicare Other | Source: Ambulatory Visit | Attending: Internal Medicine | Admitting: Internal Medicine

## 2015-05-21 VITALS — BP 108/62 | HR 81 | Temp 97.9°F | Wt 149.5 lb

## 2015-05-21 DIAGNOSIS — M2550 Pain in unspecified joint: Secondary | ICD-10-CM

## 2015-05-21 DIAGNOSIS — M25552 Pain in left hip: Secondary | ICD-10-CM

## 2015-05-21 DIAGNOSIS — R7309 Other abnormal glucose: Secondary | ICD-10-CM | POA: Diagnosis not present

## 2015-05-21 DIAGNOSIS — I10 Essential (primary) hypertension: Secondary | ICD-10-CM

## 2015-05-21 DIAGNOSIS — R7303 Prediabetes: Secondary | ICD-10-CM

## 2015-05-21 DIAGNOSIS — J309 Allergic rhinitis, unspecified: Secondary | ICD-10-CM | POA: Diagnosis not present

## 2015-05-21 DIAGNOSIS — E785 Hyperlipidemia, unspecified: Secondary | ICD-10-CM

## 2015-05-21 NOTE — Progress Notes (Signed)
   Subjective:    Patient ID: Courtney Ryan, female    DOB: 03-20-1946, 69 y.o.   MRN: [id number]  HPI  For recheck on lipid panel. Had allergy testing and was allergic to molds and dust mites. Allergy injections not suggested.  Says Astelin nasal spray made her nose bleed and did not like Flonase. C/o joint pain and has not been exercising. Pain in left hip s/p fall on ice in 1976. Bilateral hip pain, knees, wrists, ankles, back of neck, right index finger MTP joint.Wants evaluation. Will Xray left hip and do Rheumatology studies. Will try naprosyn once daily with a meal. Could benefit from exercise but says joints hurt.  Triglycerides have improved. Total cholesterol and LDL stable but not significantly changed.   Review of Systems as above     Objective:   Physical Exam  Cardiovascular: Normal rate, regular rhythm and normal heart sounds.   No murmur heard. Pulmonary/Chest: No respiratory distress. She has no wheezes. She has no rales.  Musculoskeletal: She exhibits no edema.  Bilateral Heberden's and Bouchards nodes  Skin: No rash noted.          Assessment & Plan:  Joint pain- do Rheum studies. Xray left hip  HTN-stable  Hyperlip[idemia- slightly improved esp triglycerides Allergic Rhinitis- has seen allergist  DM- AIC stable at 6.2% in May  Plan: CPE due in apprx 3 months.

## 2015-05-22 ENCOUNTER — Telehealth: Payer: Self-pay | Admitting: *Deleted

## 2015-05-22 LAB — CK: Total CK: 41 U/L (ref 7–177)

## 2015-05-22 LAB — ANA: ANA: NEGATIVE

## 2015-05-22 LAB — CYCLIC CITRUL PEPTIDE ANTIBODY, IGG: Cyclic Citrullin Peptide Ab: <2 U/mL (ref 0.0–5.0)

## 2015-05-22 LAB — RHEUMATOID FACTOR: Rhuematoid fact SerPl-aCnc: <10 IU/mL (ref <=14)

## 2015-05-22 LAB — SEDIMENTATION RATE: SED RATE: 9 mm/h (ref 0–30)

## 2015-05-22 NOTE — Telephone Encounter (Signed)
Reviewed lab and xray results with patient . Informed her we would be referring her to ortho.

## 2015-05-27 ENCOUNTER — Telehealth: Payer: Self-pay | Admitting: *Deleted

## 2015-05-27 DIAGNOSIS — M25552 Pain in left hip: Secondary | ICD-10-CM

## 2015-05-27 NOTE — Telephone Encounter (Signed)
Spoke with patient scheduled her to see Dr Lyla Glassing at [facility] August 30,2016 at 2:00 patient to arrive at 1:30 to complete NP paperwork. Patient aware of appt time. Information faxed to [place].

## 2015-05-28 ENCOUNTER — Telehealth: Payer: Self-pay | Admitting: *Deleted

## 2015-05-28 NOTE — Telephone Encounter (Signed)
Referral set up

## 2015-06-02 NOTE — Patient Instructions (Signed)
Rheum studies and Xrays pending. Consider ortho consult for hip pain. CPE due in about 3 months.

## 2015-06-03 DIAGNOSIS — M7062 Trochanteric bursitis, left hip: Secondary | ICD-10-CM | POA: Diagnosis not present

## 2015-06-03 DIAGNOSIS — M47817 Spondylosis without myelopathy or radiculopathy, lumbosacral region: Secondary | ICD-10-CM | POA: Diagnosis not present

## 2015-06-03 DIAGNOSIS — M545 Low back pain: Secondary | ICD-10-CM | POA: Diagnosis not present

## 2015-06-03 DIAGNOSIS — M5136 Other intervertebral disc degeneration, lumbar region: Secondary | ICD-10-CM | POA: Diagnosis not present

## 2015-06-13 ENCOUNTER — Other Ambulatory Visit: Payer: Self-pay | Admitting: Internal Medicine

## 2015-06-19 ENCOUNTER — Other Ambulatory Visit: Payer: Self-pay | Admitting: Internal Medicine

## 2015-07-18 ENCOUNTER — Other Ambulatory Visit: Payer: Self-pay | Admitting: Internal Medicine

## 2015-08-18 ENCOUNTER — Other Ambulatory Visit: Payer: Self-pay | Admitting: Internal Medicine

## 2015-08-18 ENCOUNTER — Other Ambulatory Visit: Payer: Medicare Other | Admitting: Internal Medicine

## 2015-08-18 DIAGNOSIS — E8881 Metabolic syndrome: Secondary | ICD-10-CM

## 2015-08-18 DIAGNOSIS — E559 Vitamin D deficiency, unspecified: Secondary | ICD-10-CM

## 2015-08-18 DIAGNOSIS — M8580 Other specified disorders of bone density and structure, unspecified site: Secondary | ICD-10-CM

## 2015-08-18 DIAGNOSIS — E785 Hyperlipidemia, unspecified: Secondary | ICD-10-CM | POA: Diagnosis not present

## 2015-08-18 DIAGNOSIS — I10 Essential (primary) hypertension: Secondary | ICD-10-CM

## 2015-08-18 DIAGNOSIS — Z1321 Encounter for screening for nutritional disorder: Secondary | ICD-10-CM

## 2015-08-18 DIAGNOSIS — E039 Hypothyroidism, unspecified: Secondary | ICD-10-CM | POA: Diagnosis not present

## 2015-08-18 DIAGNOSIS — Z79899 Other long term (current) drug therapy: Secondary | ICD-10-CM

## 2015-08-18 DIAGNOSIS — E119 Type 2 diabetes mellitus without complications: Secondary | ICD-10-CM

## 2015-08-18 LAB — CBC WITH DIFFERENTIAL/PLATELET
BASOS ABS: 0.1 10*3/uL (ref 0.0–0.1)
BASOS PCT: 1 % (ref 0–1)
Eosinophils Absolute: 0.4 10*3/uL (ref 0.0–0.7)
Eosinophils Relative: 4 % (ref 0–5)
HEMATOCRIT: 44.1 % (ref 36.0–46.0)
HEMOGLOBIN: 14.4 g/dL (ref 12.0–15.0)
LYMPHS PCT: 30 % (ref 12–46)
Lymphs Abs: 2.7 10*3/uL (ref 0.7–4.0)
MCH: 29.3 pg (ref 26.0–34.0)
MCHC: 32.7 g/dL (ref 30.0–36.0)
MCV: 89.8 fL (ref 78.0–100.0)
MPV: 9.2 fL (ref 8.6–12.4)
Monocytes Absolute: 0.7 10*3/uL (ref 0.1–1.0)
Monocytes Relative: 8 % (ref 3–12)
NEUTROS ABS: 5.1 10*3/uL (ref 1.7–7.7)
NEUTROS PCT: 57 % (ref 43–77)
Platelets: 298 10*3/uL (ref 150–400)
RBC: 4.91 MIL/uL (ref 3.87–5.11)
RDW: 14.6 % (ref 11.5–15.5)
WBC: 9 10*3/uL (ref 4.0–10.5)

## 2015-08-18 LAB — COMPLETE METABOLIC PANEL WITH GFR
ALBUMIN: 3.9 g/dL (ref 3.6–5.1)
ALK PHOS: 72 U/L (ref 33–130)
ALT: 17 U/L (ref 6–29)
AST: 16 U/L (ref 10–35)
BUN: 23 mg/dL (ref 7–25)
CALCIUM: 9.9 mg/dL (ref 8.6–10.4)
CHLORIDE: 106 mmol/L (ref 98–110)
CO2: 28 mmol/L (ref 20–31)
Creat: 0.95 mg/dL — ABNORMAL HIGH (ref 0.60–0.93)
GFR, EST AFRICAN AMERICAN: 70 mL/min (ref >=60)
GFR, EST NON AFRICAN AMERICAN: 60 mL/min (ref >=60)
Glucose, Bld: 92 mg/dL (ref 65–99)
POTASSIUM: 4.4 mmol/L (ref 3.5–5.3)
Sodium: 142 mmol/L (ref 135–146)
Total Bilirubin: 0.3 mg/dL (ref 0.2–1.2)
Total Protein: 6.4 g/dL (ref 6.1–8.1)

## 2015-08-18 LAB — LIPID PANEL
CHOL/HDL RATIO: 4.5 ratio (ref <=5.0)
CHOLESTEROL: 212 mg/dL — AB (ref 125–200)
HDL: 47 mg/dL (ref >=46)
LDL Cholesterol: 137 mg/dL — ABNORMAL HIGH (ref <130)
TRIGLYCERIDES: 138 mg/dL (ref <150)
VLDL: 28 mg/dL (ref <30)

## 2015-08-18 LAB — TSH: TSH: 1.346 u[IU]/mL (ref 0.350–4.500)

## 2015-08-18 NOTE — Addendum Note (Signed)
Addended by: Beryle Quant on: 08/18/2015 12:17 PM   Modules accepted: Orders

## 2015-08-18 NOTE — Addendum Note (Signed)
Addended by: Beryle Quant on: 08/18/2015 10:13 AM   Modules accepted: Orders

## 2015-08-19 LAB — HEMOGLOBIN A1C
HEMOGLOBIN A1C: 6.3 % — AB (ref <5.7)
Mean Plasma Glucose: 134 mg/dL — ABNORMAL HIGH (ref <117)

## 2015-08-19 LAB — VITAMIN D 25 HYDROXY (VIT D DEFICIENCY, FRACTURES): Vit D, 25-Hydroxy: 24 ng/mL — ABNORMAL LOW (ref 30–100)

## 2015-08-21 ENCOUNTER — Other Ambulatory Visit: Payer: Medicare Other | Admitting: Internal Medicine

## 2015-08-25 ENCOUNTER — Encounter: Payer: Medicare Other | Admitting: Internal Medicine

## 2015-08-26 ENCOUNTER — Ambulatory Visit (INDEPENDENT_AMBULATORY_CARE_PROVIDER_SITE_OTHER): Payer: Medicare Other | Admitting: Internal Medicine

## 2015-08-26 ENCOUNTER — Encounter: Payer: Self-pay | Admitting: Internal Medicine

## 2015-08-26 VITALS — BP 130/70 | HR 83 | Temp 98.0°F | Resp 20 | Wt 157.0 lb

## 2015-08-26 DIAGNOSIS — J0181 Other acute recurrent sinusitis: Secondary | ICD-10-CM | POA: Diagnosis not present

## 2015-08-26 DIAGNOSIS — H1031 Unspecified acute conjunctivitis, right eye: Secondary | ICD-10-CM

## 2015-08-26 MED ORDER — OFLOXACIN 0.3 % OP SOLN: OPHTHALMIC | Status: DC

## 2015-08-26 MED ORDER — LEVOFLOXACIN 500 MG PO TABS: 500.0000 mg | ORAL_TABLET | Freq: Every day | ORAL | Status: DC

## 2015-08-26 MED ORDER — PREDNISONE 10 MG PO TABS: ORAL_TABLET | ORAL | Status: DC

## 2015-08-28 ENCOUNTER — Encounter: Payer: Self-pay | Admitting: Internal Medicine

## 2015-08-28 NOTE — Patient Instructions (Addendum)
Pt has appt here in 2 weeks and will discuss ENT evaluation at that time. Take Levaquin 500 mg daily x 10 days. Sterapred DS 10 mg 6 days dosepack. Ocuflox opthalmic drops 2 drops right eye x 5 days.

## 2015-08-28 NOTE — Progress Notes (Signed)
   Subjective:    Patient ID: Courtney Ryan, female    DOB: [birth year], 69 y.o.   MRN: [id number]  HPI  Pt. c/o sinusitis symptoms for a couple of weeks. Maxillary sinus pressure. Always feels right nostril is clogged. Generally responds to antibiotics and steroids. Wants to consider ENT evaluation to ask about need for sinus surgery. No fever. No significant cough .   Review of Systems     Objective:   Physical Exam  Constitutional: No distress.  HENT:  Right Ear: External ear normal.  Left Ear: External ear normal.  Mouth/Throat: Oropharynx is clear and moist. No oropharyngeal exudate.  Narrowed opening right nostril. Boogy.  Eyes: Right eye exhibits no discharge. Left eye exhibits no discharge.  conjuctivitis right eye.  Neck: Neck supple.  Pulmonary/Chest: Effort normal and breath sounds normal. She has no wheezes. She has no rales.  Lymphadenopathy:    She has no cervical adenopathy.  Skin: She is not diaphoretic.          Assessment & Plan:  Acute sinusitis- recurrent Right eye conjunctivitis  Plan: Sterapred DS 10 mg 6 days dosepack. Levaquin 500 mg daily x 10 days. Ocuflox opthalmic drops 2 drops right eye 4 times daily x 5 days.

## 2015-09-02 ENCOUNTER — Telehealth: Payer: Self-pay

## 2015-09-02 NOTE — Telephone Encounter (Signed)
  Patient has been referred to Dr. Erik Obey office. Appt is scheduled for 2:40 (arrival) for a 3:00 (Appt) at [facility] [address].  Patient has been notified.

## 2015-09-04 ENCOUNTER — Ambulatory Visit (INDEPENDENT_AMBULATORY_CARE_PROVIDER_SITE_OTHER): Payer: Medicare Other | Admitting: Internal Medicine

## 2015-09-04 ENCOUNTER — Encounter: Payer: Self-pay | Admitting: Internal Medicine

## 2015-09-04 VITALS — BP 134/72 | HR 92 | Temp 97.2°F | Resp 20 | Ht 63.0 in | Wt 156.0 lb

## 2015-09-04 DIAGNOSIS — E785 Hyperlipidemia, unspecified: Secondary | ICD-10-CM

## 2015-09-04 DIAGNOSIS — E039 Hypothyroidism, unspecified: Secondary | ICD-10-CM | POA: Diagnosis not present

## 2015-09-04 DIAGNOSIS — H1033 Unspecified acute conjunctivitis, bilateral: Secondary | ICD-10-CM

## 2015-09-04 DIAGNOSIS — I10 Essential (primary) hypertension: Secondary | ICD-10-CM

## 2015-09-04 DIAGNOSIS — N6019 Diffuse cystic mastopathy of unspecified breast: Secondary | ICD-10-CM

## 2015-09-04 DIAGNOSIS — J320 Chronic maxillary sinusitis: Secondary | ICD-10-CM | POA: Diagnosis not present

## 2015-09-04 DIAGNOSIS — R609 Edema, unspecified: Secondary | ICD-10-CM | POA: Diagnosis not present

## 2015-09-04 DIAGNOSIS — Z8639 Personal history of other endocrine, nutritional and metabolic disease: Secondary | ICD-10-CM

## 2015-09-04 DIAGNOSIS — E119 Type 2 diabetes mellitus without complications: Secondary | ICD-10-CM | POA: Diagnosis not present

## 2015-09-04 DIAGNOSIS — M170 Bilateral primary osteoarthritis of knee: Secondary | ICD-10-CM

## 2015-09-04 DIAGNOSIS — Z0000 Encounter for general adult medical examination without abnormal findings: Secondary | ICD-10-CM | POA: Diagnosis not present

## 2015-09-04 DIAGNOSIS — E8881 Metabolic syndrome: Secondary | ICD-10-CM | POA: Diagnosis not present

## 2015-09-04 DIAGNOSIS — M8580 Other specified disorders of bone density and structure, unspecified site: Secondary | ICD-10-CM

## 2015-09-04 DIAGNOSIS — E559 Vitamin D deficiency, unspecified: Secondary | ICD-10-CM | POA: Diagnosis not present

## 2015-09-04 MED ORDER — LEVOFLOXACIN 500 MG PO TABS: 500.0000 mg | ORAL_TABLET | Freq: Every day | ORAL | Status: DC

## 2015-09-04 MED ORDER — CEFTRIAXONE SODIUM 1 G IJ SOLR
1.0000 g | INTRAMUSCULAR | Status: AC
  Administered 2015-09-04: 1 g via INTRAMUSCULAR

## 2015-09-04 NOTE — Telephone Encounter (Signed)
Wednesday Dec. 7th

## 2015-09-07 ENCOUNTER — Other Ambulatory Visit: Payer: Self-pay | Admitting: Internal Medicine

## 2015-09-07 NOTE — Telephone Encounter (Signed)
Refill x 6 months 

## 2015-09-07 NOTE — Telephone Encounter (Signed)
Called to pharmacy 

## 2015-09-16 DIAGNOSIS — J329 Chronic sinusitis, unspecified: Secondary | ICD-10-CM | POA: Diagnosis not present

## 2015-09-16 DIAGNOSIS — M26621 Arthralgia of right temporomandibular joint: Secondary | ICD-10-CM | POA: Diagnosis not present

## 2015-11-03 NOTE — Progress Notes (Signed)
Subjective:    Patient ID: Courtney Ryan, female    DOB: 1946-05-16, 70 y.o.   MRN: [id number]  HPI 70 year old White Female, native of [place] for health maintenance exam and evaluation of multiple medical issues. History of dependent edema, hypertension, hyperlipidemia, fibrocystic breast disease, hypothyroidism with history of goiter, history of migraine headaches, osteoarthritis, osteopenia, vitamin D deficiency.  Has had issues with recurrent sinusitis. Will be seeing ENT physician in the near future.  She is allergic to penicillin and sulfa. Penicillin causes vomiting. Sulfa causes hives and rash.  Past medical history: In 1968 she had an ovary removed because of a cyst.  In the 1970s she had abnormal nevus removed from left calf. Had colonoscopy in 2006 in [place]. Right breast biopsy January 2009 which was benign.   Social history: She and her husband are retired. She has a college degree. No children. Smokes 5 cigarettes daily. Drinks wine a couple of times a week.  Family history: Father died at age 34 of lung cancer. Mother died at age 29 with cancer of the liver 42.    Review of Systems     Objective:   Physical Exam  Constitutional: She is oriented to person, place, and time. She appears well-developed and well-nourished. No distress.  HENT:  Head: Normocephalic and atraumatic.  Right Ear: External ear normal.  Left Ear: External ear normal.  Mouth/Throat: Oropharynx is clear and moist. No oropharyngeal exudate.  Eyes: Conjunctivae and EOM are normal. Pupils are equal, round, and reactive to light. Right eye exhibits no discharge. Left eye exhibits no discharge. No scleral icterus.  Neck: Neck supple. No JVD present. No thyromegaly present.  Cardiovascular: Normal rate, regular rhythm, normal heart sounds and intact distal pulses.   Pulmonary/Chest: Effort normal and breath sounds normal. She has no wheezes. She has no rales.  Breasts normal female   Abdominal: Soft. Bowel sounds are normal. She exhibits no distension and no mass. There is no tenderness. There is no rebound and no guarding.  Genitourinary:  Pap done 2014 and will not be repeated due to age. Bimanual normal.  Musculoskeletal: She exhibits no edema.  Lymphadenopathy:    She has no cervical adenopathy.  Neurological: She is alert and oriented to person, place, and time. She has normal reflexes. No cranial nerve deficit. Coordination normal.  Skin: Skin is warm and dry. No rash noted. She is not diaphoretic.  Psychiatric: She has a normal mood and affect. Her behavior is normal. Judgment and thought content normal.  Vitals reviewed.         Assessment & Plan:  Chronic recurrent maxillary sinusitis-treat with 1 g IM Rocephin and refill Levaquin. To see ENT physician in the near future  Dependent edema-stable  Essential hypertension-stable on treatment  Hyperlipidemia-stable on medication  History of vitamin D deficiency-on vitamin D supplementation  Osteoarthritis knees and hips for which she takes Naprosyn  History of fibrocystic breast disease  Metabolic syndrome  Controlled type 2 diabetes mellitus treated with diet and exercise  Hypothyroidism  History of goiter  Plan: Ocuflox drops in each eye 4 times a day for 5 days. Levaquin refilled. Rocephin 1 g IM. The see ENT in the near future. Continue same medications and follow-up with me in 6 months. Diet exercise and weight loss recommended. Continue vitamin D supplement.  Subjective:   Patient presents for Medicare Annual/Subsequent preventive examination.  Review Past Medical/Family/Social: See above   Risk Factors  Current exercise habits: Tries to  walk some and light exercise Dietary issues discussed: Low fat low carbohydrate  Cardiac risk factors: Hyperlipidemia, diabetes mellitus  Depression Screen  (Note: if answer to either of the following is "Yes", a more complete depression screening  is indicated)   Over the past two weeks, have you felt down, depressed or hopeless? No  Over the past two weeks, have you felt little interest or pleasure in doing things? No Have you lost interest or pleasure in daily life? No Do you often feel hopeless? No Do you cry easily over simple problems? No   Activities of Daily Living  In your present state of health, do you have any difficulty performing the following activities?:   Driving? No  Managing money? No  Feeding yourself? No  Getting from bed to chair? No  Climbing a flight of stairs? No  Preparing food and eating?: No  Bathing or showering? No  Getting dressed: No  Getting to the toilet? No  Using the toilet:No  Moving around from place to place: No  In the past year have you fallen or had a near fall?:No  Are you sexually active? No  Do you have more than one partner? No   Hearing Difficulties: No  Do you often ask people to speak up or repeat themselves? No  Do you experience ringing or noises in your ears? No  Do you have difficulty understanding soft or whispered voices? No  Do you feel that you have a problem with memory? No Do you often misplace items? No    Home Safety:  Do you have a smoke alarm at your residence? Yes Do you have grab bars in the bathroom? No Do you have throw rugs in your house? No   Cognitive Testing  Alert? Yes Normal Appearance?Yes  Oriented to person? Yes Place? Yes  Time? Yes  Recall of three objects? Yes  Can perform simple calculations? Yes  Displays appropriate judgment?Yes  Can read the correct time from a watch face?Yes   List the Names of Other Physician/Practitioners you currently use:  See referral list for the physicians patient is currently seeing.   ENT physician  Review of Systems: See above   Objective:     General appearance: Appears stated age and mildly obese  Head: Normocephalic, without obvious abnormality, atraumatic  Eyes: conj clear, EOMi PEERLA    Ears: normal TM's and external ear canals both ears  Nose: Nares normal. Septum midline. Mucosa normal. No drainage or sinus tenderness.  Throat: lips, mucosa, and tongue normal; teeth and gums normal  Neck: no adenopathy, no carotid bruit, no JVD, supple, symmetrical, trachea midline and thyroid not enlarged, symmetric, no tenderness/mass/nodules  No CVA tenderness.  Lungs: clear to auscultation bilaterally  Breasts: normal appearance, no masses or tenderness Heart: regular rate and rhythm, S1, S2 normal, no murmur, click, rub or gallop  Abdomen: soft, non-tender; bowel sounds normal; no masses, no organomegaly  Musculoskeletal: ROM normal in all joints, no crepitus, no deformity, Normal muscle strengthen. Back  is symmetric, no curvature. Skin: Skin color, texture, turgor normal. No rashes or lesions  Lymph nodes: Cervical, supraclavicular, and axillary nodes normal.  Neurologic: CN 2 -12 Normal, Normal symmetric reflexes. Normal coordination and gait  Psych: Alert & Oriented x 3, Mood appear stable.    Assessment:    Annual wellness medicare exam   Plan:    During the course of the visit the patient was educated and counseled about appropriate screening and preventive services including:  Annual mammogram  Annual flu vaccine      Patient Instructions (the written plan) was given to the patient.  Medicare Attestation  I have personally reviewed:  The patient's medical and social history  Their use of alcohol, tobacco or illicit drugs  Their current medications and supplements  The patient's functional ability including ADLs,fall risks, home safety risks, cognitive, and hearing and visual impairment  Diet and physical activities  Evidence for depression or mood disorders  The patient's weight, height, BMI, and visual acuity have been recorded in the chart. I have made referrals, counseling, and provided education to the patient based on review of the above and I have  provided the patient with a written personalized care plan for preventive services.

## 2015-11-29 DIAGNOSIS — J329 Chronic sinusitis, unspecified: Secondary | ICD-10-CM | POA: Insufficient documentation

## 2015-11-29 NOTE — Patient Instructions (Addendum)
Given Rocephin 1 g IM and Levaquin refilled. See ENT physician in the near future. History of chronic sinusitis. Otherwise continue same medications and return in 6 months. Ocuflox 2 drops in each eye 4 times a day for 5 days

## 2015-11-30 ENCOUNTER — Other Ambulatory Visit: Payer: Self-pay

## 2015-11-30 MED ORDER — SYNTHROID 75 MCG PO TABS: 75.0000 ug | ORAL_TABLET | Freq: Every day | ORAL | Status: DC

## 2015-12-09 DIAGNOSIS — J342 Deviated nasal septum: Secondary | ICD-10-CM | POA: Insufficient documentation

## 2015-12-14 ENCOUNTER — Other Ambulatory Visit: Payer: Self-pay

## 2015-12-14 MED ORDER — AMLODIPINE BESYLATE 5 MG PO TABS: 5.0000 mg | ORAL_TABLET | Freq: Every day | ORAL | Status: DC

## 2016-01-06 ENCOUNTER — Other Ambulatory Visit: Payer: Self-pay

## 2016-01-06 MED ORDER — FUROSEMIDE 40 MG PO TABS: ORAL_TABLET | ORAL | Status: DC

## 2016-01-06 NOTE — Telephone Encounter (Signed)
Refill request received from [facility]

## 2016-01-13 ENCOUNTER — Other Ambulatory Visit: Payer: Self-pay

## 2016-01-13 MED ORDER — ATENOLOL 25 MG PO TABS: 25.0000 mg | ORAL_TABLET | Freq: Every day | ORAL | Status: DC

## 2016-03-10 ENCOUNTER — Encounter: Payer: Self-pay | Admitting: Internal Medicine

## 2016-03-10 ENCOUNTER — Ambulatory Visit (INDEPENDENT_AMBULATORY_CARE_PROVIDER_SITE_OTHER): Payer: Medicare Other | Admitting: Internal Medicine

## 2016-03-10 VITALS — BP 128/68 | HR 76 | Temp 97.5°F | Resp 18 | Wt 154.0 lb

## 2016-03-10 DIAGNOSIS — R300 Dysuria: Secondary | ICD-10-CM

## 2016-03-10 MED ORDER — METRONIDAZOLE 500 MG PO TABS
500.0000 mg | ORAL_TABLET | Freq: Two times a day (BID) | ORAL | Status: DC
Start: 2016-03-10 — End: 2016-03-21

## 2016-03-10 NOTE — Progress Notes (Signed)
   Subjective:    Patient ID: Courtney Ryan, female    DOB: 07/17/1946, 70 y.o.   MRN: [id number]  HPI Patient complaining of pain in right lower pelvic area after urination. It hurts just for a couple of seconds. Seems to hurt after she gets up from commode and washes her hands. No blood in urine. Urine dipstick today is unremarkable. Also complaining of recurrent foul-smelling vaginal discharge. Slight itching. Has been doing vinegar and water douche twice a week. Told her I thought that was a bit too much and she should only douche once a month.  Also complaining of lower extremity edema. Has been keeping legs elevated.    Review of Systems     Objective:   Physical Exam  White vaginal discharge noted but not excessive. Inspection of vagina shows no blood. She is tender over her right pubic area. Doesn't recall any fall or injury to that area. It's clearly her pubic bone that is tender on the right.  No pitting edema of the lower extremities.      Assessment & Plan:  Bacterial vaginosis-treat with Flagyl 500 mg twice daily for 7 days  Musculoskeletal pain right pubis  Plan: We are going to culture her urine but it is by dipstick clear without blood, LE or nitrite. She'll be treated with a seven-day course of Flagyl. Lower extremity edema does not appear to be significant today.

## 2016-03-10 NOTE — Patient Instructions (Addendum)
Flagyl 500 mg twice a day x 7 days. Only douche once a month. Urine culture pending.

## 2016-03-12 LAB — CULTURE, URINE COMPREHENSIVE
Colony Count: NO GROWTH
Organism ID, Bacteria: NO GROWTH

## 2016-03-21 ENCOUNTER — Ambulatory Visit (INDEPENDENT_AMBULATORY_CARE_PROVIDER_SITE_OTHER): Payer: Medicare Other | Admitting: Internal Medicine

## 2016-03-21 ENCOUNTER — Encounter: Payer: Self-pay | Admitting: Internal Medicine

## 2016-03-21 VITALS — BP 126/72 | HR 70 | Temp 97.0°F | Resp 18 | Wt 156.5 lb

## 2016-03-21 DIAGNOSIS — I10 Essential (primary) hypertension: Secondary | ICD-10-CM | POA: Diagnosis not present

## 2016-03-21 DIAGNOSIS — E785 Hyperlipidemia, unspecified: Secondary | ICD-10-CM

## 2016-03-21 DIAGNOSIS — E559 Vitamin D deficiency, unspecified: Secondary | ICD-10-CM

## 2016-03-21 DIAGNOSIS — E119 Type 2 diabetes mellitus without complications: Secondary | ICD-10-CM

## 2016-03-21 DIAGNOSIS — F411 Generalized anxiety disorder: Secondary | ICD-10-CM

## 2016-03-21 DIAGNOSIS — E039 Hypothyroidism, unspecified: Secondary | ICD-10-CM | POA: Diagnosis not present

## 2016-03-21 DIAGNOSIS — R609 Edema, unspecified: Secondary | ICD-10-CM | POA: Diagnosis not present

## 2016-03-21 MED ORDER — ALPRAZOLAM 0.25 MG PO TABS: ORAL_TABLET | ORAL | Status: DC

## 2016-03-21 NOTE — Patient Instructions (Addendum)
Continue diuretic. Xanax refilled. RTC  Tomorrow for lab work otherwise  CPE in December with labs

## 2016-03-22 ENCOUNTER — Encounter: Payer: Self-pay | Admitting: Internal Medicine

## 2016-03-22 ENCOUNTER — Other Ambulatory Visit: Payer: Medicare Other | Admitting: Internal Medicine

## 2016-03-22 DIAGNOSIS — I10 Essential (primary) hypertension: Secondary | ICD-10-CM

## 2016-03-22 DIAGNOSIS — E039 Hypothyroidism, unspecified: Secondary | ICD-10-CM

## 2016-03-22 DIAGNOSIS — E119 Type 2 diabetes mellitus without complications: Secondary | ICD-10-CM

## 2016-03-22 DIAGNOSIS — E785 Hyperlipidemia, unspecified: Secondary | ICD-10-CM

## 2016-03-22 LAB — BASIC METABOLIC PANEL
BUN: 16 mg/dL (ref 7–25)
CHLORIDE: 103 mmol/L (ref 98–110)
CO2: 28 mmol/L (ref 20–31)
CREATININE: 1.04 mg/dL — AB (ref 0.60–0.93)
Calcium: 9.9 mg/dL (ref 8.6–10.4)
Glucose, Bld: 95 mg/dL (ref 65–99)
POTASSIUM: 4.7 mmol/L (ref 3.5–5.3)
SODIUM: 141 mmol/L (ref 135–146)

## 2016-03-22 LAB — LIPID PANEL
Cholesterol: 190 mg/dL (ref 125–200)
HDL: 42 mg/dL — ABNORMAL LOW (ref >=46)
LDL CALC: 110 mg/dL (ref <130)
Total CHOL/HDL Ratio: 4.5 Ratio (ref <=5.0)
Triglycerides: 192 mg/dL — ABNORMAL HIGH (ref <150)
VLDL: 38 mg/dL — AB (ref <30)

## 2016-03-22 LAB — HEMOGLOBIN A1C
HEMOGLOBIN A1C: 6.1 % — AB (ref <5.7)
MEAN PLASMA GLUCOSE: 128 mg/dL

## 2016-03-22 LAB — TSH: TSH: 1.08 mIU/L

## 2016-03-22 NOTE — Progress Notes (Signed)
   Subjective:    Patient ID: Courtney Ryan, female    DOB: 10/13/1945, 70 y.o.   MRN: [id number]  HPI 70 year old Female in today for six-month recheck on multiple medical issues essential hypertension, controlled type 2 diabetes mellitus, dependent edema, hyperlipidemia, fibrocystic breast disease, hypothyroidism with history of goiter, history of migraine headaches, osteoarthritis, osteopenia, vitamin D deficiency.  Says her legs continue to swell. Is asking about support type stockings. She has no pitting edema. She is on Lasix.  History of anxiety for which she takes Xanax sparingly. This will be refilled today. Blood pressures under good control on current regimen and stable at 126/72.  Has had issues from time to time with chronic sinusitis. No complaints about this today.    Review of Systems     Objective:   Physical Exam  Skin warm and dry. Nodes none. Neck is supple without JVD thyromegaly or carotid bruits. Chest clear to auscultation. Cardiac exam regular rate and rhythm normal S1 and S2. Extremities without pitting edema. There is only trace lower extremity edema today and the weather is quite warm.      Assessment & Plan:  Essential hypertension-stable on current regimen  Dependent edema-not very impressive today. Stable on diuretic.  Hyperlipidemia-to have fasting lipid panel  Hypothyroidism with history of goiter-TSH to be checked  Controlled type 2 diabetes mellitus-hemoglobin A1c to be checked  History of vitamin D deficiency-vitamin D level will only be checked yearly  Anxiety-Xanax refilled.  Plan: To return tomorrow for fasting lab work. Schedule physical exam in 6 months. No changes in medication at present time

## 2016-05-04 ENCOUNTER — Telehealth: Payer: Self-pay

## 2016-05-04 NOTE — Telephone Encounter (Signed)
Spoke to patient. Advised would need to come for Microalbumin check. Patient verbalized understanding.  States she will have to call back to schedule.

## 2016-05-19 ENCOUNTER — Other Ambulatory Visit: Payer: Medicare Other | Admitting: Internal Medicine

## 2016-05-19 DIAGNOSIS — E119 Type 2 diabetes mellitus without complications: Secondary | ICD-10-CM

## 2016-05-20 ENCOUNTER — Other Ambulatory Visit: Payer: Self-pay

## 2016-05-20 LAB — HEMOGLOBIN A1C
Hgb A1c MFr Bld: 6.1 % — ABNORMAL HIGH (ref <5.7)
Mean Plasma Glucose: 128 mg/dL

## 2016-05-20 LAB — MICROALBUMIN / CREATININE URINE RATIO
Creatinine, Urine: 163 mg/dL (ref 20–320)
Microalb Creat Ratio: 6 mcg/mg creat (ref <30)
Microalb, Ur: 0.9 mg/dL

## 2016-05-20 MED ORDER — SYNTHROID 75 MCG PO TABS: 75.0000 ug | ORAL_TABLET | Freq: Every day | ORAL | 5 refills | Status: DC

## 2016-06-27 ENCOUNTER — Other Ambulatory Visit: Payer: Self-pay | Admitting: *Deleted

## 2016-06-27 MED ORDER — FUROSEMIDE 40 MG PO TABS: ORAL_TABLET | ORAL | 5 refills | Status: DC

## 2016-08-23 DIAGNOSIS — D485 Neoplasm of uncertain behavior of skin: Secondary | ICD-10-CM | POA: Diagnosis not present

## 2016-08-23 DIAGNOSIS — Z23 Encounter for immunization: Secondary | ICD-10-CM | POA: Diagnosis not present

## 2016-08-23 DIAGNOSIS — L821 Other seborrheic keratosis: Secondary | ICD-10-CM | POA: Diagnosis not present

## 2016-08-23 DIAGNOSIS — D229 Melanocytic nevi, unspecified: Secondary | ICD-10-CM | POA: Diagnosis not present

## 2016-08-29 ENCOUNTER — Other Ambulatory Visit: Payer: Medicare Other | Admitting: Internal Medicine

## 2016-08-29 DIAGNOSIS — E559 Vitamin D deficiency, unspecified: Secondary | ICD-10-CM

## 2016-08-29 DIAGNOSIS — I10 Essential (primary) hypertension: Secondary | ICD-10-CM

## 2016-08-29 DIAGNOSIS — E785 Hyperlipidemia, unspecified: Secondary | ICD-10-CM

## 2016-08-29 DIAGNOSIS — E039 Hypothyroidism, unspecified: Secondary | ICD-10-CM

## 2016-08-29 DIAGNOSIS — E119 Type 2 diabetes mellitus without complications: Secondary | ICD-10-CM

## 2016-08-29 LAB — CBC WITH DIFFERENTIAL/PLATELET
BASOS ABS: 94 {cells}/uL (ref 0–200)
Basophils Relative: 1 %
EOS ABS: 282 {cells}/uL (ref 15–500)
Eosinophils Relative: 3 %
HEMATOCRIT: 46 % — AB (ref 35.0–45.0)
HEMOGLOBIN: 15.1 g/dL (ref 11.7–15.5)
LYMPHS ABS: 2444 {cells}/uL (ref 850–3900)
Lymphocytes Relative: 26 %
MCH: 29.4 pg (ref 27.0–33.0)
MCHC: 32.8 g/dL (ref 32.0–36.0)
MCV: 89.7 fL (ref 80.0–100.0)
MONO ABS: 752 {cells}/uL (ref 200–950)
MPV: 9.3 fL (ref 7.5–12.5)
Monocytes Relative: 8 %
NEUTROS PCT: 62 %
Neutro Abs: 5828 cells/uL (ref 1500–7800)
Platelets: 302 10*3/uL (ref 140–400)
RBC: 5.13 MIL/uL — ABNORMAL HIGH (ref 3.80–5.10)
RDW: 15 % (ref 11.0–15.0)
WBC: 9.4 10*3/uL (ref 3.8–10.8)

## 2016-08-29 LAB — COMPLETE METABOLIC PANEL WITH GFR
ALBUMIN: 4.1 g/dL (ref 3.6–5.1)
ALK PHOS: 85 U/L (ref 33–130)
ALT: 21 U/L (ref 6–29)
AST: 17 U/L (ref 10–35)
BILIRUBIN TOTAL: 0.3 mg/dL (ref 0.2–1.2)
BUN: 15 mg/dL (ref 7–25)
CO2: 29 mmol/L (ref 20–31)
Calcium: 10.2 mg/dL (ref 8.6–10.4)
Chloride: 105 mmol/L (ref 98–110)
Creat: 1.01 mg/dL — ABNORMAL HIGH (ref 0.60–0.93)
GFR, EST NON AFRICAN AMERICAN: 56 mL/min — AB (ref >=60)
GFR, Est African American: 64 mL/min (ref >=60)
Glucose, Bld: 97 mg/dL (ref 65–99)
POTASSIUM: 4.9 mmol/L (ref 3.5–5.3)
Sodium: 142 mmol/L (ref 135–146)
TOTAL PROTEIN: 6.8 g/dL (ref 6.1–8.1)

## 2016-08-29 LAB — LIPID PANEL
CHOL/HDL RATIO: 4.6 ratio (ref <5.0)
CHOLESTEROL: 220 mg/dL — AB (ref <200)
HDL: 48 mg/dL — AB (ref >50)
LDL Cholesterol: 135 mg/dL — ABNORMAL HIGH (ref <100)
Triglycerides: 185 mg/dL — ABNORMAL HIGH (ref <150)
VLDL: 37 mg/dL — ABNORMAL HIGH (ref <30)

## 2016-08-29 LAB — TSH: TSH: 1.63 m[IU]/L

## 2016-08-29 LAB — HEMOGLOBIN A1C
Hgb A1c MFr Bld: 6 % — ABNORMAL HIGH (ref <5.7)
MEAN PLASMA GLUCOSE: 126 mg/dL

## 2016-08-30 LAB — VITAMIN D 25 HYDROXY (VIT D DEFICIENCY, FRACTURES): VIT D 25 HYDROXY: 25 ng/mL — AB (ref 30–100)

## 2016-08-30 LAB — MICROALBUMIN, URINE: Microalb, Ur: 1.2 mg/dL

## 2016-09-02 ENCOUNTER — Other Ambulatory Visit: Payer: Medicare Other | Admitting: Internal Medicine

## 2016-09-05 ENCOUNTER — Telehealth: Payer: Self-pay | Admitting: Internal Medicine

## 2016-09-05 ENCOUNTER — Ambulatory Visit (INDEPENDENT_AMBULATORY_CARE_PROVIDER_SITE_OTHER): Payer: Medicare Other | Admitting: Internal Medicine

## 2016-09-05 ENCOUNTER — Encounter: Payer: Self-pay | Admitting: Internal Medicine

## 2016-09-05 VITALS — BP 136/82 | HR 83 | Temp 98.4°F | Ht 63.0 in | Wt 160.0 lb

## 2016-09-05 DIAGNOSIS — I10 Essential (primary) hypertension: Secondary | ICD-10-CM | POA: Diagnosis not present

## 2016-09-05 DIAGNOSIS — E7849 Other hyperlipidemia: Secondary | ICD-10-CM

## 2016-09-05 DIAGNOSIS — E119 Type 2 diabetes mellitus without complications: Secondary | ICD-10-CM

## 2016-09-05 DIAGNOSIS — F411 Generalized anxiety disorder: Secondary | ICD-10-CM

## 2016-09-05 DIAGNOSIS — E8881 Metabolic syndrome: Secondary | ICD-10-CM | POA: Diagnosis not present

## 2016-09-05 DIAGNOSIS — J320 Chronic maxillary sinusitis: Secondary | ICD-10-CM

## 2016-09-05 DIAGNOSIS — E784 Other hyperlipidemia: Secondary | ICD-10-CM | POA: Diagnosis not present

## 2016-09-05 DIAGNOSIS — M8580 Other specified disorders of bone density and structure, unspecified site: Secondary | ICD-10-CM | POA: Diagnosis not present

## 2016-09-05 DIAGNOSIS — M25552 Pain in left hip: Secondary | ICD-10-CM

## 2016-09-05 DIAGNOSIS — E559 Vitamin D deficiency, unspecified: Secondary | ICD-10-CM

## 2016-09-05 DIAGNOSIS — Z0000 Encounter for general adult medical examination without abnormal findings: Secondary | ICD-10-CM

## 2016-09-05 DIAGNOSIS — M25551 Pain in right hip: Secondary | ICD-10-CM

## 2016-09-05 DIAGNOSIS — R1031 Right lower quadrant pain: Secondary | ICD-10-CM

## 2016-09-05 DIAGNOSIS — N6019 Diffuse cystic mastopathy of unspecified breast: Secondary | ICD-10-CM

## 2016-09-05 DIAGNOSIS — Z8639 Personal history of other endocrine, nutritional and metabolic disease: Secondary | ICD-10-CM

## 2016-09-05 DIAGNOSIS — N760 Acute vaginitis: Secondary | ICD-10-CM

## 2016-09-05 DIAGNOSIS — B9689 Other specified bacterial agents as the cause of diseases classified elsewhere: Secondary | ICD-10-CM

## 2016-09-05 DIAGNOSIS — G8929 Other chronic pain: Secondary | ICD-10-CM

## 2016-09-05 MED ORDER — CLINDAMYCIN PHOSPHATE 2 % VA CREA
1.0000 | TOPICAL_CREAM | Freq: Every day | VAGINAL | 0 refills | Status: DC
Start: 2016-09-05 — End: 2017-03-06

## 2016-09-05 NOTE — Progress Notes (Signed)
Subjective:    Patient ID: Courtney Ryan, female    DOB: Jan 28, 1946, 70 y.o.   MRN: [id number]  HPI   70 year old Female for health maintenance exam and evaluation of medical issues.  Has diarrhea about 2 times a week. Will give IBS handout. Declines flu vaccine.  Patient also continues to complain of right groin and right lower quadrant abdominal pain. She had the same issue last year. Says it gets worse at times and is difficult to walk. We're going to do CT of abdomen and pelvis for further evaluation.  She also appears to have a left hip problem. Has pain with external rotation of left hip. Is going to see orthopedist regarding this. Last year she saw orthopedist at [facility] who felt she had a back problem. Whatever the cause of the pain, it has not improved. She was also told she might have trochanteric bursitis but did not receive an injection.  Patient also complaining of foul-smelling vaginal discharge. She had this last year and was treated successfully. It has come back. She does use a vinegar and water douche once a month.  She has a history of dependent edema, hypertension, hyperlipidemia, fibrocystic breast disease, hypothyroidism with history of goiter, history of migraine headaches, osteoarthritis, osteopenia, vitamin D deficiency.  She has had issues with recurrent sinusitis and has seen ENT physician.  She is allergic to penicillin and sulfa. Sulfa causes hives and rash. Penicillin causes vomiting.  Past medical history: In 1968 she had an ovary removed because of a cyst. In the 1970s she had abnormal nevus removed from left calf. She had colonoscopy in 2006 in [place]. Right breast biopsy January 2009 which was benign.  Social history: She smokes about 5 cigarettes daily. Drinks wine a couple times a week. She and her husband are retired. She has a college degree. No children.  Family history: Father died at age 63 of lung cancer. Mother died at age 20  with cancer of the liver in 49.     Review of Systems see multiple issues above     Objective:   Physical Exam Skin warm and dry. Nodes none. Head is normocephalic and atraumatic. TMs are clear. Pharynx is clear. Conjunctivae are clear. Extraocular movements are full. PERRLA. Neck is supple without JVD thyromegaly or carotid bruits. Cardiac exam :regular rate and rhythm normal S1 and S2. Chest is clear to auscultation without rales or wheezing.  Abdomen without hepatosplenomegaly masses or significant tenderness. No lower extremity edema. No obvious hernias. Pain with external rotation of left hip.  Pap done 2014 and not repeated due to age. Bimanual normal.  No lower extremity edema. Neurological exam intact without focal deficits. Skin is warm and dry. Normal mood and affect.       Assessment & Plan:  Right lower quadrant abdominal pain-? Hernia. Rule out ovarian mass etc. Is to have CT of the abdomen and pelvis for further evaluation. Complains pain is worse in the right groin typically with ambulation.  Left hip osteoarthritis versus trochanteric bursitis. Is to see orthopedist.  Bacterial vaginosis-Cleocin vaginal cream daily at bedtime 7 days  Irritable bowel syndrome-IBS handout given  Hypothyroidism-stable on thyroid replacement  Hyperlipidemia-total cholesterol and LDL cholesterol not as good as last year. Total cholesterol was 220 and last year was 190. Triglycerides 185 and last year 192. LDL cholesterol 135 and last year was 110. Continue diet and exercise regimen  Vitamin D deficiency-does not take supplement every day. Recommend  2000 units vitamin D 3 daily  Diabetes mellitus hemoglobin A1c 6% and previously was 6.1%. Continue diet and exercise regimen  Elevated serum creatinine-stable at 1.01  Plan: Orthopedic referral regarding hip pain. CT of pelvis because of persistent complaint of right lower quadrant pain. Patient thinks she may have a hernia. Labs  reviewed with her. No change in current medications. She has bacterial vaginosis and will be treated with Cleocin vaginal cream. Regarding irritable bowel symptoms, given IBS handout. Take vitamin D supplement. Watch diet. Try to exercise. Continue thyroid replacement at current dosage.  Subjective:   Patient presents for Medicare Annual/Subsequent preventive examination.  Review Past Medical/Family/Social:See above   Risk Factors  Current exercise habits: Tries to walk some Dietary issues discussed: Low fat low carbohydrate  Cardiac risk factors: Hyperlipidemia and impaired glucose tolerance  Depression Screen  (Note: if answer to either of the following is "Yes", a more complete depression screening is indicated)   Over the past two weeks, have you felt down, depressed or hopeless? No  Over the past two weeks, have you felt little interest or pleasure in doing things? No Have you lost interest or pleasure in daily life? No Do you often feel hopeless? No Do you cry easily over simple problems? No   Activities of Daily Living  In your present state of health, do you have any difficulty performing the following activities?:   Driving? No  Managing money? No  Feeding yourself? No  Getting from bed to chair? No  Climbing a flight of stairs? No  Preparing food and eating?: No  Bathing or showering? No  Getting dressed: No  Getting to the toilet? No  Using the toilet:No  Moving around from place to place: No  In the past year have you fallen or had a near fall?:No  Are you sexually active? No  Do you have more than one partner? No   Hearing Difficulties: No  Do you often ask people to speak up or repeat themselves? No  Do you experience ringing or noises in your ears? No  Do you have difficulty understanding soft or whispered voices? No  Do you feel that you have a problem with memory? No Do you often misplace items? No    Home Safety:  Do you have a smoke alarm at your  residence? Yes Do you have grab bars in the bathroom?No Do you have throw rugs in your house? No   Cognitive Testing  Alert? Yes Normal Appearance?Yes  Oriented to person? Yes Place? Yes  Time? Yes  Recall of three objects? Yes  Can perform simple calculations? Yes  Displays appropriate judgment?Yes  Can read the correct time from a watch face?Yes   List the Names of Other Physician/Practitioners you currently use:  See referral list for the physicians patient is currently seeing.  Has seen ENT regarding sinusitis   Review of Systems: See above  Objective:     General appearance: Appears stated age and mildly obese  Head: Normocephalic, without obvious abnormality, atraumatic  Eyes: conj clear, EOMi PEERLA  Ears: normal TM's and external ear canals both ears  Nose: Nares normal. Septum midline. Mucosa normal. No drainage or sinus tenderness.  Throat: lips, mucosa, and tongue normal; teeth and gums normal  Neck: no adenopathy, no carotid bruit, no JVD, supple, symmetrical, trachea midline and thyroid not enlarged, symmetric, no tenderness/mass/nodules  No CVA tenderness.  Lungs: clear to auscultation bilaterally  Breasts: normal appearance, no masses or tenderness Heart: regular  rate and rhythm, S1, S2 normal, no murmur, click, rub or gallop  Abdomen: soft, non-tender; bowel sounds normal; no masses, no organomegaly  Musculoskeletal: ROM normal in all joints, no crepitus, no deformity, Normal muscle strengthen. Back  is symmetric, no curvature. Skin: Skin color, texture, turgor normal. No rashes or lesions  Lymph nodes: Cervical, supraclavicular, and axillary nodes normal.  Neurologic: CN 2 -12 Normal, Normal symmetric reflexes. Normal coordination and gait  Psych: Alert & Oriented x 3, Mood appear stable.    Assessment:    Annual wellness medicare exam   Plan:    During the course of the visit the patient was educated and counseled about appropriate screening and  preventive services including:   Annual flu vaccine  Annual mammogram     Patient Instructions (the written plan) was given to the patient.  Medicare Attestation  I have personally reviewed:  The patient's medical and social history  Their use of alcohol, tobacco or illicit drugs  Their current medications and supplements  The patient's functional ability including ADLs,fall risks, home safety risks, cognitive, and hearing and visual impairment  Diet and physical activities  Evidence for depression or mood disorders  The patient's weight, height, BMI, and visual acuity have been recorded in the chart. I have made referrals, counseling, and provided education to the patient based on review of the above and I have provided the patient with a written personalized care plan for preventive services.

## 2016-09-05 NOTE — Telephone Encounter (Signed)
Patient informed that her insurance does not require prior auth for CT.  Advised patient she can walk into [facility] or call and schedule appointment.  Information given.

## 2016-09-14 ENCOUNTER — Ambulatory Visit
Admission: RE | Admit: 2016-09-14 | Discharge: 2016-09-14 | Disposition: A | Discharge status: Home / Self Care | Payer: Medicare Other | Source: Ambulatory Visit | Attending: Internal Medicine | Admitting: Internal Medicine

## 2016-09-14 DIAGNOSIS — R1031 Right lower quadrant pain: Secondary | ICD-10-CM

## 2016-09-14 MED ORDER — IOPAMIDOL (ISOVUE-300) INJECTION 61%
100.0000 mL | Freq: Once | INTRAVENOUS | Status: AC | PRN
  Administered 2016-09-14: 100 mL via INTRAVENOUS

## 2016-09-15 ENCOUNTER — Telehealth: Payer: Self-pay | Admitting: Internal Medicine

## 2016-09-15 NOTE — Telephone Encounter (Signed)
-----   Message from Elby Showers, MD sent at 09/15/2016 10:06 AM EST ----- No inguinal hernia to explain pain. Has fibroid uterus.

## 2016-09-15 NOTE — Telephone Encounter (Signed)
Informed patient of CT results.

## 2016-09-21 ENCOUNTER — Ambulatory Visit (INDEPENDENT_AMBULATORY_CARE_PROVIDER_SITE_OTHER): Payer: Medicare Other | Admitting: Orthopaedic Surgery

## 2016-09-21 ENCOUNTER — Encounter (INDEPENDENT_AMBULATORY_CARE_PROVIDER_SITE_OTHER): Payer: Self-pay | Admitting: Orthopaedic Surgery

## 2016-09-21 ENCOUNTER — Ambulatory Visit (INDEPENDENT_AMBULATORY_CARE_PROVIDER_SITE_OTHER): Payer: Medicare Other

## 2016-09-21 DIAGNOSIS — M7062 Trochanteric bursitis, left hip: Secondary | ICD-10-CM | POA: Diagnosis not present

## 2016-09-21 DIAGNOSIS — M25552 Pain in left hip: Secondary | ICD-10-CM

## 2016-09-21 MED ORDER — LIDOCAINE HCL 1 % IJ SOLN
3.0000 mL | INTRAMUSCULAR | Status: AC | PRN
  Administered 2016-09-21: 3 mL

## 2016-09-21 MED ORDER — METHYLPREDNISOLONE ACETATE 40 MG/ML IJ SUSP
40.0000 mg | INTRAMUSCULAR | Status: AC | PRN
  Administered 2016-09-21: 40 mg via INTRA_ARTICULAR

## 2016-09-21 NOTE — Progress Notes (Signed)
Office Visit Note   Patient: Courtney Ryan           Date of Birth: 20-Aug-1946           MRN: [id number] Visit Date: 09/21/2016              Requested by: Elby Showers, MD [address] [place], [place] [id number] PCP: Elby Showers, MD   Assessment & Plan: Visit Diagnoses:  1. Pain in left hip   2. Trochanteric bursitis, left hip     Plan: Based on x-ray findings and physical exam findings actually feel that this is trochanteric bursitis. I provided a steroid injection of the trochanteric area and showed her stretching exercises. Hopefully this will resolve slowly with time. If the injection and a home exercise program does not work, my next step would be outpatient physical therapy.  Follow-Up Instructions: Return if symptoms worsen or fail to improve.   Orders:  Orders Placed This Encounter  Procedures  . Large Joint Injection/Arthrocentesis  . XR HIP UNILAT W OR W/O PELVIS 2-3 VIEWS LEFT   No orders of the defined types were placed in this encounter.     Procedures: Large Joint Inj Date/Time: 09/21/2016 4:51 PM Performed by: Mcarthur Rossetti Authorized by: Mcarthur Rossetti   Location:  Hip Site:  L greater trochanter Ultrasound Guidance: No   Fluoroscopic Guidance: No   Arthrogram: No   Medications:  3 mL lidocaine 1 %; 40 mg methylPREDNISolone acetate 40 MG/ML     Clinical Data: No additional findings.   Subjective: Chief Complaint  Patient presents with  . Left Hip - Pain    She states that she fell on ice years ago, pushing off on that side causes the pain, was diagnosed with bursitis, also was told she had 2 crushed vertebrae in her lower back.     HPI She is an extremely pleasant 69 year old female originally from [place] who has left hip pain and is been told in the past this was trochanteric bursitis. It's flared up on her recently and she points to the greater trochanteric areas a source for pain. She denies any pain in  her groin. Review of Systems Negative for headache, shortness of breath, fever, chills, nausea, vomiting.  Objective: Vital Signs: There were no vitals taken for this visit.  Physical Exam He is alert and in oriented 3 and gets up on the exam table easily and is very pleasant. Ortho Exam Examination of her left hip shows a normal-appearing hip in terms of rotation and range of motion without pain in the groin at all. Her pain is only over the tip of greater trochanter as well as the IT band. Specialty Comments:  No specialty comments available.  Imaging: Xr Hip Unilat W Or W/o Pelvis 2-3 Views Left  Result Date: 09/21/2016 An AP pelvis and a lateral of her left hip show well located hips. Her hip joints look great. There is no significant arthritic findings at all. Her hip ball is nice and round and is no para-articular osteophytes. She has slight bone spurring over the tip of the greater trochanter posteriorly but no significant cortical irregularities and no lytic lesions.    PMFS History: Patient Active Problem List   Diagnosis Date Noted  . Sinusitis, chronic 11/29/2015  . Controlled diabetes mellitus type II without complication ([place]) 0000000  . Dependent edema 04/24/2014  . Hyperlipidemia 06/16/2011  . Hypothyroidism 06/16/2011  . Osteoarthritis 06/16/2011  . Goiter 06/16/2011  .  Fibrocystic breast disease 06/16/2011  . Osteopenia 06/16/2011  . Vitamin D deficiency 06/16/2011  . Hypertension 06/16/2011  . Migraine headache 06/16/2011   Past Medical History:  Diagnosis Date  . Fibrocystic breast disease   . Goiter   . Hyperlipidemia   . Migraine   . Osteopenia   . Vitamin D deficiency     Family History  Problem Relation Age of Onset  . Cancer Mother   . Cancer Father     Past Surgical History:  Procedure Laterality Date  . BREAST BIOPSY    . OOPHORECTOMY    . OVARIAN CYST REMOVAL     Social History   Occupational History  . Not on file.    Social History Main Topics  . Smoking status: Current Every Day Smoker    Packs/day: 0.40    Types: Cigarettes  . Smokeless tobacco: Never Used  . Alcohol use Not on file  . Drug use: Unknown  . Sexual activity: Not on file

## 2016-10-01 DIAGNOSIS — D251 Intramural leiomyoma of uterus: Secondary | ICD-10-CM | POA: Insufficient documentation

## 2016-10-01 DIAGNOSIS — D252 Subserosal leiomyoma of uterus: Secondary | ICD-10-CM

## 2016-10-06 ENCOUNTER — Encounter: Payer: Self-pay | Admitting: Internal Medicine

## 2016-10-06 NOTE — Patient Instructions (Signed)
You will have CT of pelvis because of pelvic pain. You well see orthopedist regarding hip pain. Continue same medications and return in 6 months. Try to exercise. For bacterial vaginosis she will be treated with Cleocin vaginal cream.

## 2016-11-12 ENCOUNTER — Other Ambulatory Visit: Payer: Self-pay | Admitting: Internal Medicine

## 2016-11-18 ENCOUNTER — Ambulatory Visit (INDEPENDENT_AMBULATORY_CARE_PROVIDER_SITE_OTHER): Payer: Medicare Other | Admitting: Internal Medicine

## 2016-11-18 ENCOUNTER — Ambulatory Visit
Admission: RE | Admit: 2016-11-18 | Discharge: 2016-11-18 | Disposition: A | Discharge status: Home / Self Care | Payer: Medicare Other | Source: Ambulatory Visit | Attending: Internal Medicine | Admitting: Internal Medicine

## 2016-11-18 ENCOUNTER — Encounter: Payer: Self-pay | Admitting: Internal Medicine

## 2016-11-18 VITALS — BP 130/80 | HR 87 | Temp 97.4°F | Wt 159.0 lb

## 2016-11-18 DIAGNOSIS — M25542 Pain in joints of left hand: Secondary | ICD-10-CM

## 2016-11-18 DIAGNOSIS — Z5181 Encounter for therapeutic drug level monitoring: Secondary | ICD-10-CM | POA: Diagnosis not present

## 2016-11-18 DIAGNOSIS — F419 Anxiety disorder, unspecified: Secondary | ICD-10-CM

## 2016-11-18 DIAGNOSIS — Z79890 Hormone replacement therapy (postmenopausal): Secondary | ICD-10-CM

## 2016-11-18 DIAGNOSIS — M79645 Pain in left finger(s): Secondary | ICD-10-CM | POA: Diagnosis not present

## 2016-11-18 MED ORDER — ESTROGENS, CONJUGATED 0.625 MG/GM VA CREA: 1.0000 | TOPICAL_CREAM | Freq: Every evening | VAGINAL | 12 refills | Status: DC | PRN

## 2016-11-18 MED ORDER — ALPRAZOLAM 0.25 MG PO TABS: ORAL_TABLET | ORAL | 5 refills | Status: DC

## 2016-11-18 NOTE — Progress Notes (Signed)
   Subjective:    Patient ID: Courtney Ryan, female    DOB: 1946/04/24, 71 y.o.   MRN: [id number]  HPI Patient says about 6 weeks ago she was standing on a chair trying to reach something in her kitchen cabinet and suffered a fall injuring her left thumb. She says it has been tender to touch but equally with flexion. Sometimes it appears to pop a bit. This is first time she has sought treatment for it since the injury.  Also needs estrogen replacement refilled that was prescribed by GYN  Also wants her Xanax refill for anxiety    Review of Systems see above     Objective:   Physical Exam  She is tender in her MCP and PIP joints. She is also tender between no joints. She has good movement in her left thumb. There is no obvious redness or significant swelling.  X-ray shows no obvious fracture      Assessment & Plan:  Injury to left thumb secondary to a fall 6 weeks ago  Estrogen replacement-Premarin vaginal cream refilled  Anxiety-Xanax refill for 6 months  Plan: Appointment to see hand surgeon regarding left thumb in the near future

## 2016-11-18 NOTE — Patient Instructions (Signed)
To see hand surgeon next week regarding left thumb injury. Refilled Premarin vaginal cream and Xanax.

## 2016-12-21 DIAGNOSIS — M65311 Trigger thumb, right thumb: Secondary | ICD-10-CM | POA: Insufficient documentation

## 2017-01-24 ENCOUNTER — Telehealth: Payer: Self-pay | Admitting: Internal Medicine

## 2017-01-24 ENCOUNTER — Encounter: Payer: Self-pay | Admitting: Internal Medicine

## 2017-01-24 MED ORDER — FUROSEMIDE 40 MG PO TABS: ORAL_TABLET | ORAL | 1 refills | Status: DC

## 2017-01-24 MED ORDER — ATENOLOL 25 MG PO TABS: 25.0000 mg | ORAL_TABLET | Freq: Every day | ORAL | 3 refills | Status: DC

## 2017-01-24 NOTE — Telephone Encounter (Signed)
Refill atenolol for one year. Refill Lasix for 6 months.

## 2017-01-26 ENCOUNTER — Other Ambulatory Visit: Payer: Self-pay

## 2017-01-26 MED ORDER — FUROSEMIDE 40 MG PO TABS: ORAL_TABLET | ORAL | 3 refills | Status: DC

## 2017-01-26 MED ORDER — ATENOLOL 25 MG PO TABS: 25.0000 mg | ORAL_TABLET | Freq: Every day | ORAL | 3 refills | Status: DC

## 2017-03-03 ENCOUNTER — Other Ambulatory Visit: Payer: Medicare Other | Admitting: Internal Medicine

## 2017-03-03 DIAGNOSIS — E119 Type 2 diabetes mellitus without complications: Secondary | ICD-10-CM

## 2017-03-03 DIAGNOSIS — E7849 Other hyperlipidemia: Secondary | ICD-10-CM

## 2017-03-03 DIAGNOSIS — E039 Hypothyroidism, unspecified: Secondary | ICD-10-CM

## 2017-03-03 DIAGNOSIS — I10 Essential (primary) hypertension: Secondary | ICD-10-CM

## 2017-03-04 LAB — LIPID PANEL
CHOL/HDL RATIO: 5 ratio — AB (ref <5.0)
CHOLESTEROL: 214 mg/dL — AB (ref <200)
HDL: 43 mg/dL — ABNORMAL LOW (ref >50)
LDL Cholesterol: 139 mg/dL — ABNORMAL HIGH (ref <100)
Triglycerides: 159 mg/dL — ABNORMAL HIGH (ref <150)
VLDL: 32 mg/dL — AB (ref <30)

## 2017-03-04 LAB — HEMOGLOBIN A1C
HEMOGLOBIN A1C: 6.1 % — AB (ref <5.7)
MEAN PLASMA GLUCOSE: 128 mg/dL

## 2017-03-04 LAB — BASIC METABOLIC PANEL
BUN: 19 mg/dL (ref 7–25)
CO2: 26 mmol/L (ref 20–31)
Calcium: 10.2 mg/dL (ref 8.6–10.4)
Chloride: 106 mmol/L (ref 98–110)
Creat: 1.08 mg/dL — ABNORMAL HIGH (ref 0.60–0.93)
Glucose, Bld: 94 mg/dL (ref 65–99)
POTASSIUM: 4.5 mmol/L (ref 3.5–5.3)
SODIUM: 142 mmol/L (ref 135–146)

## 2017-03-04 LAB — MICROALBUMIN / CREATININE URINE RATIO
CREATININE, URINE: 225 mg/dL (ref 20–320)
MICROALB/CREAT RATIO: 5 ug/mg{creat} (ref <30)
Microalb, Ur: 1.1 mg/dL

## 2017-03-04 LAB — TSH: TSH: 0.55 m[IU]/L

## 2017-03-06 ENCOUNTER — Encounter: Payer: Self-pay | Admitting: Internal Medicine

## 2017-03-06 ENCOUNTER — Ambulatory Visit (INDEPENDENT_AMBULATORY_CARE_PROVIDER_SITE_OTHER): Payer: Medicare Other | Admitting: Internal Medicine

## 2017-03-06 VITALS — BP 110/58 | HR 72 | Temp 98.4°F | Wt 159.0 lb

## 2017-03-06 DIAGNOSIS — J0101 Acute recurrent maxillary sinusitis: Secondary | ICD-10-CM

## 2017-03-06 DIAGNOSIS — I10 Essential (primary) hypertension: Secondary | ICD-10-CM | POA: Diagnosis not present

## 2017-03-06 DIAGNOSIS — E784 Other hyperlipidemia: Secondary | ICD-10-CM | POA: Diagnosis not present

## 2017-03-06 DIAGNOSIS — F411 Generalized anxiety disorder: Secondary | ICD-10-CM

## 2017-03-06 DIAGNOSIS — R609 Edema, unspecified: Secondary | ICD-10-CM

## 2017-03-06 DIAGNOSIS — E119 Type 2 diabetes mellitus without complications: Secondary | ICD-10-CM

## 2017-03-06 DIAGNOSIS — Z8639 Personal history of other endocrine, nutritional and metabolic disease: Secondary | ICD-10-CM

## 2017-03-06 DIAGNOSIS — R1031 Right lower quadrant pain: Secondary | ICD-10-CM

## 2017-03-06 DIAGNOSIS — E7849 Other hyperlipidemia: Secondary | ICD-10-CM

## 2017-03-06 DIAGNOSIS — E8881 Metabolic syndrome: Secondary | ICD-10-CM

## 2017-03-06 MED ORDER — LEVOFLOXACIN 500 MG PO TABS: 500.0000 mg | ORAL_TABLET | Freq: Every day | ORAL | 0 refills | Status: DC

## 2017-03-06 MED ORDER — PREDNISONE 10 MG PO TABS: ORAL_TABLET | ORAL | 0 refills | Status: DC

## 2017-03-09 NOTE — Progress Notes (Signed)
   Subjective:    Patient ID: Courtney Ryan, female    DOB: 1946/04/22, 71 y.o.   MRN: [id number]  HPI History of hypothyroidism, hyperlipidemia, hypertension and controlled type 2 diabetes mellitus. History of chronic recurrent maxillary sinusitis. Dr. Lyla Son mother treated her recently for trigger finger of right thumb. In December 2017 she was seen upon [facility] in [facility] for intramural and subserous leiomyoma of uterus. This was thought to possibly be causing some right lower quadrant abdominal pain. She saw Dr. Ninfa Linden in December 2017 for left hip pain and injection was given which helped. Still complaining of some right lower quadrant abdominal pain and we're not going to pursue that as right adrenal gland and right kidney appeared to be normal with no ovarian masses noted. No lesions identified in the right inguinal region. Appendix was not identified. No ascites present or abscess. No bowel obstruction was noted. 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 She had minimal ventral hernia containing only fat.  Thinks she has another sinus infection today. Will be treated with Levaquin and a tapering course of prednisone   Review of Systems see above     Objective:   Physical Exam  Skin warm and dry. Nodes none. TMs and pharynx are clear. Neck is supple. Chest clear to auscultation without rales or wheezing. Cardiac exam regular rate and rhythm normal S1 and S2. Extremities without significant pitting edema      Assessment & Plan:  Hip pain improved after injection by Dr. Ninfa Linden  Hypertension-stable  Hypothyroidism-TSH is stable on thyroid replacement  Hyperlipidemia-stable on diet with triglycerides 159 total cholesterol 214 and LDL cholesterol 139. Does not want to be on statin therapy  Allergic rhinitis treated with Claritin  Dependent edema treated with Lasix  Vaginal dryness treated with topical estrogen by GYN  Controlled type 2 diabetes mellitus-hemoglobin A1c  6.1%  Plan: Levaquin and prednisone as directed. Return in 6 months and continue same medications. Reassure patient these problems are stable at the present time. She does not want to be on statin therapy

## 2017-03-09 NOTE — Patient Instructions (Addendum)
Treated for sinusitis with Levaquin and prednisone today. Continue same medications and return in 6 months for physical examination. Continue to work on diet exercise and weight loss.

## 2017-05-11 ENCOUNTER — Telehealth: Payer: Self-pay

## 2017-05-11 MED ORDER — SYNTHROID 75 MCG PO TABS: 75.0000 ug | ORAL_TABLET | Freq: Every day | ORAL | 5 refills | Status: DC

## 2017-05-11 NOTE — Telephone Encounter (Signed)
Received fax from [facility] in regards to a refill on Synthroid 48mcg for patient. Medication was refilled per Dr. Verlene Mayer request. Sent 6 months

## 2017-06-02 ENCOUNTER — Telehealth: Payer: Self-pay

## 2017-06-02 MED ORDER — AMLODIPINE BESYLATE 5 MG PO TABS: 5.0000 mg | ORAL_TABLET | Freq: Every day | ORAL | 3 refills | Status: DC

## 2017-06-02 NOTE — Telephone Encounter (Signed)
Received fax from [facility] in regards to a refill on Amlodipine 5 mg for patient. Medication was refilled per Dr. Verlene Mayer request. Sent 1 year

## 2017-08-22 ENCOUNTER — Encounter: Payer: Self-pay | Admitting: Internal Medicine

## 2017-08-22 ENCOUNTER — Ambulatory Visit (INDEPENDENT_AMBULATORY_CARE_PROVIDER_SITE_OTHER): Payer: Medicare Other | Admitting: Internal Medicine

## 2017-08-22 VITALS — BP 130/70 | HR 78 | Temp 98.3°F | Wt 157.0 lb

## 2017-08-22 DIAGNOSIS — J0100 Acute maxillary sinusitis, unspecified: Secondary | ICD-10-CM | POA: Diagnosis not present

## 2017-08-22 MED ORDER — ALPRAZOLAM 0.25 MG PO TABS: ORAL_TABLET | ORAL | 5 refills | Status: DC

## 2017-08-22 MED ORDER — PREDNISONE 10 MG PO TABS: ORAL_TABLET | ORAL | 0 refills | Status: DC

## 2017-08-22 MED ORDER — LEVOFLOXACIN 500 MG PO TABS: 500.0000 mg | ORAL_TABLET | Freq: Every day | ORAL | 0 refills | Status: DC

## 2017-08-23 ENCOUNTER — Telehealth: Payer: Self-pay | Admitting: Internal Medicine

## 2017-08-23 MED ORDER — AZITHROMYCIN 250 MG PO TABS: ORAL_TABLET | ORAL | 0 refills | Status: DC

## 2017-08-23 NOTE — Telephone Encounter (Signed)
States she cannot take this antibiotic; says that it causes her body aches.  Says that she takes notes and from previous notes, it causes her aches.  Wants to know if you could prescribe her something else please?    Thank you.

## 2017-08-23 NOTE — Telephone Encounter (Signed)
Medication sent.

## 2017-08-23 NOTE — Telephone Encounter (Signed)
Please call in Zithromax Z-pak. This ios the first I have heard of this- have always prescribed Levaquin in the past and when I asked her yesterday about levaquin she agreed.

## 2017-08-27 NOTE — Progress Notes (Signed)
   Subjective:    Patient ID: Courtney Ryan, female    DOB: [birth year], 71 y.o.   MRN: [id number]  HPI Has come down with URI symptoms.  Feels that she has a sinus infection.  Has maxillary sinus pressure.  Not a lot of discharge from her nose.  No significant cough.  No fever or chills.  Has seen ENT physician previously for recurrent issues with this    Review of Systems see above     Objective:   Physical Exam Skin warm and dry.  Neck:  Supple without adenopathy.  Chest clear to auscultation.  Pharynx is clear.  She sounds nasally congested.       Assessment & Plan:  Acute maxillary sinusitis   Plan:Levaquin 500 mg daily x 10 days. Tapering course of prednisone goin from 60 mg to 0 mg over 7 days.

## 2017-08-28 NOTE — Patient Instructions (Signed)
Levaquin 500 mg daily x 10 days. Prednisone tapering course.

## 2017-09-04 ENCOUNTER — Other Ambulatory Visit: Payer: Medicare Other | Admitting: Internal Medicine

## 2017-09-06 ENCOUNTER — Other Ambulatory Visit: Payer: Self-pay | Admitting: Internal Medicine

## 2017-09-06 DIAGNOSIS — E569 Vitamin deficiency, unspecified: Secondary | ICD-10-CM

## 2017-09-06 DIAGNOSIS — E119 Type 2 diabetes mellitus without complications: Secondary | ICD-10-CM

## 2017-09-06 DIAGNOSIS — E039 Hypothyroidism, unspecified: Secondary | ICD-10-CM

## 2017-09-06 DIAGNOSIS — Z0000 Encounter for general adult medical examination without abnormal findings: Secondary | ICD-10-CM

## 2017-09-06 DIAGNOSIS — M8580 Other specified disorders of bone density and structure, unspecified site: Secondary | ICD-10-CM

## 2017-09-06 DIAGNOSIS — I10 Essential (primary) hypertension: Secondary | ICD-10-CM

## 2017-09-06 DIAGNOSIS — E785 Hyperlipidemia, unspecified: Secondary | ICD-10-CM

## 2017-09-08 ENCOUNTER — Encounter: Payer: Medicare Other | Admitting: Internal Medicine

## 2017-09-18 ENCOUNTER — Other Ambulatory Visit (INDEPENDENT_AMBULATORY_CARE_PROVIDER_SITE_OTHER): Payer: Medicare Other | Admitting: Internal Medicine

## 2017-09-18 DIAGNOSIS — E569 Vitamin deficiency, unspecified: Secondary | ICD-10-CM

## 2017-09-18 DIAGNOSIS — Z0000 Encounter for general adult medical examination without abnormal findings: Secondary | ICD-10-CM

## 2017-09-18 DIAGNOSIS — I10 Essential (primary) hypertension: Secondary | ICD-10-CM

## 2017-09-18 DIAGNOSIS — E119 Type 2 diabetes mellitus without complications: Secondary | ICD-10-CM

## 2017-09-18 DIAGNOSIS — M8580 Other specified disorders of bone density and structure, unspecified site: Secondary | ICD-10-CM

## 2017-09-18 DIAGNOSIS — E785 Hyperlipidemia, unspecified: Secondary | ICD-10-CM

## 2017-09-18 NOTE — Addendum Note (Signed)
Addended by: Mady Haagensen on: 09/18/2017 09:37 AM   Modules accepted: Orders

## 2017-09-18 NOTE — Addendum Note (Signed)
Addended by: Mady Haagensen on: 09/18/2017 09:36 AM   Modules accepted: Orders

## 2017-09-19 LAB — CBC WITH DIFFERENTIAL/PLATELET
BASOS PCT: 1 %
Basophils Absolute: 102 cells/uL (ref 0–200)
EOS PCT: 1 %
Eosinophils Absolute: 102 cells/uL (ref 15–500)
HCT: 45.5 % — ABNORMAL HIGH (ref 35.0–45.0)
HEMOGLOBIN: 15.1 g/dL (ref 11.7–15.5)
Lymphs Abs: 2458 cells/uL (ref 850–3900)
MCH: 28.6 pg (ref 27.0–33.0)
MCHC: 33.2 g/dL (ref 32.0–36.0)
MCV: 86.2 fL (ref 80.0–100.0)
MONOS PCT: 7.6 %
MPV: 9.2 fL (ref 7.5–12.5)
NEUTROS ABS: 6763 {cells}/uL (ref 1500–7800)
Neutrophils Relative %: 66.3 %
PLATELETS: 319 10*3/uL (ref 140–400)
RBC: 5.28 10*6/uL — ABNORMAL HIGH (ref 3.80–5.10)
RDW: 14.2 % (ref 11.0–15.0)
Total Lymphocyte: 24.1 %
WBC mixed population: 775 cells/uL (ref 200–950)
WBC: 10.2 10*3/uL (ref 3.8–10.8)

## 2017-09-19 LAB — LIPID PANEL
CHOL/HDL RATIO: 5.4 (calc) — AB (ref <5.0)
CHOLESTEROL: 217 mg/dL — AB (ref <200)
HDL: 40 mg/dL — AB (ref >50)
LDL CHOLESTEROL (CALC): 141 mg/dL — AB
Non-HDL Cholesterol (Calc): 177 mg/dL (calc) — ABNORMAL HIGH (ref <130)
Triglycerides: 224 mg/dL — ABNORMAL HIGH (ref <150)

## 2017-09-19 LAB — COMPLETE METABOLIC PANEL WITH GFR
AG Ratio: 1.5 (calc) (ref 1.0–2.5)
ALBUMIN MSPROF: 4.1 g/dL (ref 3.6–5.1)
ALKALINE PHOSPHATASE (APISO): 86 U/L (ref 33–130)
ALT: 15 U/L (ref 6–29)
AST: 13 U/L (ref 10–35)
BUN / CREAT RATIO: 20 (calc) (ref 6–22)
BUN: 20 mg/dL (ref 7–25)
CO2: 30 mmol/L (ref 20–32)
CREATININE: 1 mg/dL — AB (ref 0.60–0.93)
Calcium: 10.6 mg/dL — ABNORMAL HIGH (ref 8.6–10.4)
Chloride: 104 mmol/L (ref 98–110)
GFR, EST AFRICAN AMERICAN: 65 mL/min/{1.73_m2} (ref >=60)
GFR, EST NON AFRICAN AMERICAN: 56 mL/min/{1.73_m2} — AB (ref >=60)
GLOBULIN: 2.7 g/dL (ref 1.9–3.7)
Glucose, Bld: 96 mg/dL (ref 65–99)
Potassium: 4.6 mmol/L (ref 3.5–5.3)
SODIUM: 142 mmol/L (ref 135–146)
TOTAL PROTEIN: 6.8 g/dL (ref 6.1–8.1)
Total Bilirubin: 0.4 mg/dL (ref 0.2–1.2)

## 2017-09-19 LAB — VITAMIN D 25 HYDROXY (VIT D DEFICIENCY, FRACTURES): Vit D, 25-Hydroxy: 30 ng/mL (ref 30–100)

## 2017-09-19 LAB — TSH: TSH: 0.82 mIU/L (ref 0.40–4.50)

## 2017-09-20 ENCOUNTER — Encounter: Payer: Self-pay | Admitting: Internal Medicine

## 2017-09-20 ENCOUNTER — Ambulatory Visit (INDEPENDENT_AMBULATORY_CARE_PROVIDER_SITE_OTHER): Payer: Medicare Other | Admitting: Internal Medicine

## 2017-09-20 VITALS — BP 120/80 | HR 86 | Ht 62.5 in | Wt 152.0 lb

## 2017-09-20 DIAGNOSIS — M8580 Other specified disorders of bone density and structure, unspecified site: Secondary | ICD-10-CM

## 2017-09-20 DIAGNOSIS — I10 Essential (primary) hypertension: Secondary | ICD-10-CM

## 2017-09-20 DIAGNOSIS — E119 Type 2 diabetes mellitus without complications: Secondary | ICD-10-CM | POA: Diagnosis not present

## 2017-09-20 DIAGNOSIS — Z0000 Encounter for general adult medical examination without abnormal findings: Secondary | ICD-10-CM

## 2017-09-20 DIAGNOSIS — Z8639 Personal history of other endocrine, nutritional and metabolic disease: Secondary | ICD-10-CM | POA: Diagnosis not present

## 2017-09-20 DIAGNOSIS — N6019 Diffuse cystic mastopathy of unspecified breast: Secondary | ICD-10-CM | POA: Diagnosis not present

## 2017-09-20 DIAGNOSIS — E7849 Other hyperlipidemia: Secondary | ICD-10-CM

## 2017-09-20 DIAGNOSIS — F419 Anxiety disorder, unspecified: Secondary | ICD-10-CM | POA: Diagnosis not present

## 2017-09-20 LAB — POCT URINALYSIS DIPSTICK
APPEARANCE: NORMAL
Bilirubin, UA: NEGATIVE
Glucose, UA: NEGATIVE
Ketones, UA: NEGATIVE
LEUKOCYTES UA: NEGATIVE
NITRITE UA: NEGATIVE
Odor: NORMAL
PH UA: 6 (ref 5.0–8.0)
Protein, UA: NEGATIVE
RBC UA: NEGATIVE
SPEC GRAV UA: 1.02 (ref 1.010–1.025)
UROBILINOGEN UA: 0.2 U/dL

## 2017-09-20 MED ORDER — ROSUVASTATIN CALCIUM 5 MG PO TABS
ORAL_TABLET | ORAL | 3 refills | Status: DC
Start: 2017-09-20 — End: 2017-12-28

## 2017-09-20 NOTE — Progress Notes (Signed)
Subjective:    Patient ID: Courtney Ryan, female    DOB: 02/24/1946, 71 y.o.   MRN: [id number]  HPI 71 year old White Female for health maintenance, Medicare wellness and evaluation of medical issues.  She has a history of dependent edema, hypertension, hyperlipidemia, fibrocystic breast disease, hypothyroidism with history of goiter, history of migraine headaches, osteoarthritis, osteopenia and vitamin D deficiency.  She has issues with recurrent sinusitis and has seen ENT physician.  We treated her for sinusitis in November with Levaquin and a tapering course of prednisone.  She is allergic to penicillin and sulfa.  Sulfa causes hives and rash.  Penicillin causes vomiting.  Dr. Ninfa Linden saw her in December 2017 and treated her for trochanteric bursitis left hip.  History of bacterial vaginosis.  In March she saw Dr. Fredna Dow for trigger finger of right thumb.  Past medical history: In 1968 she had an ovary removed because of a cyst.  In the 1970s she had abnormal nevus removed from left calf.  She had a colonoscopy in 2006 in [place].  Right breast biopsy January 2009 which was benign.  Also in 2017 was complaining for some time right groin and right lower quadrant abdominal pain.  CT of the abdomen and pelvis were performed.  She was found to have  fatty liver and a minimal ventral hernia containing only fat.  She had left adrenal adenoma.  She had aortoiliac atherosclerosis.  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  No  demonstrable mass or adenopathy in the abdomen.  Dr. Ninfa Linden did x-ray of the hip in December 2017 and it was negative.  Social history: She smokes about 5 cigarettes daily.  Drinks wine a couple of times a week.  She and her husband are retired.  She has a college degree.  No children.  Family history: Father died at age 67 of lung cancer.  Mother died at age 76 with cancer of the liver in 28.  She has a history of irritable bowel syndrome.      Review of Systems    Constitutional: Negative.   HENT:       History of recurrent sinus infections  Respiratory: Negative.   Cardiovascular: Negative.   Neurological: Negative.   Psychiatric/Behavioral: Negative.        Objective:   Physical Exam  Constitutional: She is oriented to person, place, and time. She appears well-developed and well-nourished. No distress.  HENT:  Head: Normocephalic and atraumatic.  Right Ear: External ear normal.  Left Ear: External ear normal.  Mouth/Throat: Oropharynx is clear and moist.  Eyes: Conjunctivae and EOM are normal. Pupils are equal, round, and reactive to light. Right eye exhibits no discharge. Left eye exhibits no discharge. No scleral icterus.  Neck: Neck supple. No JVD present. No thyromegaly present.  Cardiovascular: Normal rate, regular rhythm and normal heart sounds.  No murmur heard. Pulmonary/Chest: Effort normal and breath sounds normal. No respiratory distress. She has no wheezes. She has no rales. She exhibits no tenderness.  Breasts normal female  Abdominal: Soft. Bowel sounds are normal. She exhibits no distension and no mass. There is no tenderness. There is no rebound and no guarding.  Genitourinary:  Genitourinary Comments: Bimanual normal  Musculoskeletal: She exhibits no edema.  Lymphadenopathy:    She has no cervical adenopathy.  Neurological: She is alert and oriented to person, place, and time. She has normal reflexes. No cranial nerve deficit. Coordination normal.  Skin: Skin is warm and dry. No rash noted. She  is not diaphoretic.  Psychiatric: She has a normal mood and affect. Her behavior is normal. Judgment and thought content normal.  Vitals reviewed.         Assessment & Plan:  Hypothyroidism-stable on thyroid replacement  History of recurrent sinusitis  Dependent edema-treated with Lasix  Hypertension stable on 2 drug regimen and Lasix  Hyperlipidemia-total cholesterol is 217 with an LDL cholesterol of 40.   Triglycerides are 224 and LDL cholesterol 141.  Last year LDL was 139 with a total cholesterol of 214.  Needs to work on diet and exercise.  Does not want to be on statin therapy.  However I have convinced her to take Crestor 5 mg twice a week follow-up in 3 months.  Fibrocystic breast disease  History of migraine headaches  Osteoarthritis  Osteopenia  History of vitamin D deficiency  History of trochanteric bursitis  Irritable bowel syndrome  History of bacterial vaginosis  Plan: She is to work on diet exercise and weight loss due to elevated lipids.  Take Crestor twice a week and follow-up in # months.  Otherwise continue same medications for hypertension and hypothyroidism  Subjective:   Patient presents for Medicare Annual/Subsequent preventive examination.  Review Past Medical/Family/Social: See above   Risk Factors  Current exercise habits: Needs to walk regularly Dietary issues discussed: Low-fat low carbohydrate  Cardiac risk factors: Hyperlipidemia and diabetes  Depression Screen  (Note: if answer to either of the following is "Yes", a more complete depression screening is indicated)   Over the past two weeks, have you felt down, depressed or hopeless? No  Over the past two weeks, have you felt little interest or pleasure in doing things? No Have you lost interest or pleasure in daily life? No Do you often feel hopeless? No Do you cry easily over simple problems? No   Activities of Daily Living  In your present state of health, do you have any difficulty performing the following activities?:   Driving? No  Managing money? No  Feeding yourself? No  Getting from bed to chair? No  Climbing a flight of stairs? No  Preparing food and eating?: No  Bathing or showering? No  Getting dressed: No  Getting to the toilet? No  Using the toilet:No  Moving around from place to place: No  In the past year have you fallen or had a near fall?:No  Are you sexually  active? No  Do you have more than one partner? No   Hearing Difficulties: No  Do you often ask people to speak up or repeat themselves? No  Do you experience ringing or noises in your ears? No  Do you have difficulty understanding soft or whispered voices? No  Do you feel that you have a problem with memory? No Do you often misplace items? No    Home Safety:  Do you have a smoke alarm at your residence? Yes Do you have grab bars in the bathroom?  No Do you have throw rugs in your house?  No   Cognitive Testing  Alert? Yes Normal Appearance?Yes  Oriented to person? Yes Place? Yes  Time? Yes  Recall of three objects? Yes  Can perform simple calculations? Yes  Displays appropriate judgment?Yes  Can read the correct time from a watch face?Yes   List the Names of Other Physician/Practitioners you currently use:  See referral list for the physicians patient is currently seeing.     Review of Systems: See above   Objective:  General appearance: Appears stated age and mildly obese  Head: Normocephalic, without obvious abnormality, atraumatic  Eyes: conj clear, EOMi PEERLA  Ears: normal TM's and external ear canals both ears  Nose: Nares normal. Septum midline. Mucosa normal. No drainage or sinus tenderness.  Throat: lips, mucosa, and tongue normal; teeth and gums normal  Neck: no adenopathy, no carotid bruit, no JVD, supple, symmetrical, trachea midline and thyroid not enlarged, symmetric, no tenderness/mass/nodules  No CVA tenderness.  Lungs: clear to auscultation bilaterally  Breasts: normal appearance, no masses or tenderness  Heart: regular rate and rhythm, S1, S2 normal, no murmur, click, rub or gallop  Abdomen: soft, non-tender; bowel sounds normal; no masses, no organomegaly  Musculoskeletal: ROM normal in all joints, no crepitus, no deformity, Normal muscle strengthen. Back  is symmetric, no curvature. Skin: Skin color, texture, turgor normal. No rashes or lesions   Lymph nodes: Cervical, supraclavicular, and axillary nodes normal.  Neurologic: CN 2 -12 Normal, Normal symmetric reflexes. Normal coordination and gait  Psych: Alert & Oriented x 3, Mood appear stable.    Assessment:    Annual wellness medicare exam   Plan:    During the course of the visit the patient was educated and counseled about appropriate screening and preventive services including:   Annual mammogram     Patient Instructions (the written plan) was given to the patient.  Medicare Attestation  I have personally reviewed:  The patient's medical and social history  Their use of alcohol, tobacco or illicit drugs  Their current medications and supplements  The patient's functional ability including ADLs,fall risks, home safety risks, cognitive, and hearing and visual impairment  Diet and physical activities  Evidence for depression or mood disorders  The patient's weight, height, BMI, and visual acuity have been recorded in the chart. I have made referrals, counseling, and provided education to the patient based on review of the above and I have provided the patient with a written personalized care plan for preventive services.

## 2017-10-22 ENCOUNTER — Encounter: Payer: Self-pay | Admitting: Internal Medicine

## 2017-10-22 NOTE — Patient Instructions (Signed)
Take Crestor twice a week.  Follow a low-fat diet and try to get more exercise.  Continue other medications as previously prescribed and follow-up in 3 months.

## 2017-11-08 ENCOUNTER — Other Ambulatory Visit: Payer: Self-pay

## 2017-11-08 MED ORDER — SYNTHROID 75 MCG PO TABS: 75.0000 ug | ORAL_TABLET | Freq: Every day | ORAL | 5 refills | Status: DC

## 2017-11-29 ENCOUNTER — Other Ambulatory Visit: Payer: Self-pay

## 2017-11-29 MED ORDER — ESTROGENS, CONJUGATED 0.625 MG/GM VA CREA: 1.0000 | TOPICAL_CREAM | Freq: Every evening | VAGINAL | 12 refills | Status: DC | PRN

## 2017-12-11 ENCOUNTER — Encounter (INDEPENDENT_AMBULATORY_CARE_PROVIDER_SITE_OTHER): Payer: Self-pay | Admitting: Physician Assistant

## 2017-12-11 ENCOUNTER — Ambulatory Visit (INDEPENDENT_AMBULATORY_CARE_PROVIDER_SITE_OTHER): Payer: Medicare Other | Admitting: Physician Assistant

## 2017-12-11 ENCOUNTER — Ambulatory Visit (INDEPENDENT_AMBULATORY_CARE_PROVIDER_SITE_OTHER): Payer: Medicare Other

## 2017-12-11 DIAGNOSIS — M25511 Pain in right shoulder: Secondary | ICD-10-CM

## 2017-12-11 MED ORDER — LIDOCAINE HCL 1 % IJ SOLN
3.0000 mL | INTRAMUSCULAR | Status: AC | PRN
  Administered 2017-12-11: 3 mL

## 2017-12-11 MED ORDER — METHYLPREDNISOLONE ACETATE 40 MG/ML IJ SUSP
40.0000 mg | INTRAMUSCULAR | Status: AC | PRN
  Administered 2017-12-11: 40 mg via INTRA_ARTICULAR

## 2017-12-11 NOTE — Progress Notes (Signed)
Office Visit Note   Patient: Courtney Ryan           Date of Birth: 03/28/1946           MRN: [id number] Visit Date: 12/11/2017              Requested by: Courtney Showers, MD [address] [place], [place] [id number] PCP: Courtney Showers, MD   Assessment & Plan: Visit Diagnoses:  1. Acute pain of right shoulder     Plan: She will follow-up with [place] in 2 weeks check progress lack of.  In the interim she will perform pendulum Codman, wall crawls and forward flexion exercises as shown.  She did ask about a sling I told her I prefer she stay out of any type of sling and keep moving the arm as much as possible.  She will watch her glucose levels over the next few days closely.  Follow-Up Instructions: Return in about 2 weeks (around 12/25/2017).   Orders:  Orders Placed This Encounter  Procedures  . XR Shoulder Right   No orders of the defined types were placed in this encounter.     Procedures: Large Joint Inj: R subacromial bursa on 12/11/2017 4:25 PM Indications: pain Details: 22 G 1.5 in needle, superior approach  Arthrogram: No  Medications: 3 mL lidocaine 1 %; 40 mg methylPREDNISolone acetate 40 MG/ML Outcome: tolerated well, no immediate complications Procedure, treatment alternatives, risks and benefits explained, specific risks discussed. Consent was given by the patient. Immediately prior to procedure a time out was called to verify the correct patient, procedure, equipment, support staff and site/side marked as required. Patient was prepped and draped in the usual sterile fashion.       Clinical Data: No additional findings.   Subjective: Chief Complaint  Patient presents with  . Right Shoulder - Pain    HPI [name] comes in today for right shoulder pain for a month.  She reports that the pain started after lifting a heavy box and felt a pop in her shoulder.  She is having difficulty dressing having difficulty with any overhead activity involving  the right shoulder.  She does have occasional achiness which is somewhat vague about but no significant numbness or tingling she states she just has achiness in her fingers at times.  She is tried aspirin.  She reports to be borderline diabetic last hemoglobin A1c was 6.1 Review of Systems No fevers chills chest pain or shortness of breath. Please see HPI otherwise negative Objective: Vital Signs: There were no vitals taken for this visit.  Physical Exam  Constitutional: She is oriented to person, place, and time. She appears well-developed and well-nourished. No distress.  Pulmonary/Chest: Effort normal.  Neurological: She is alert and oriented to person, place, and time.  Skin: She is not diaphoretic.  Psychiatric: She has a normal mood and affect.    Ortho Exam Bilateral shoulder she has 5 out of 5 strength with internal rotation.  Right shoulder 4 out of 5 strength with external rotation.  Positive empty can test on the right negative on the left.  Positive impingement testing on the right negative on the left.  Biceps strength 5 out of 5 bilaterally.  She has no tenderness about the scapulas bilaterally.  Tenderness in the right trapezius region.  Nontender at the biceps tendon distally bilaterally.  Radial pulses are 2+ bilaterally equal symmetric.  Negative phalanx test bilaterally.  Negative compression and Tinel's over the wrist bilaterally.  Sensation grossly intact bilateral hands to light touch throughout.  Cervical spine she has full flexion extension and rotation without pain.  Nontender about the cervical spine and paraspinous region. Specialty Comments:  No specialty comments available.  Imaging: Xr Shoulder Right  Result Date: 12/11/2017 Right shoulder 3 views: Minimal AC joint degenerative changes.  Right shoulder is well located.  Glenohumeral joint is well-maintained.  No acute fractures or bony abnormalities.  Sub-acromial space is well-maintained.    PMFS  History: Patient Active Problem List   Diagnosis Date Noted  . Sinusitis, chronic 11/29/2015  . Controlled diabetes mellitus type II without complication ([place]) 51/88/4166  . Dependent edema 04/24/2014  . Hyperlipidemia 06/16/2011  . Hypothyroidism 06/16/2011  . Osteoarthritis 06/16/2011  . Goiter 06/16/2011  . Fibrocystic breast disease 06/16/2011  . Osteopenia 06/16/2011  . Vitamin D deficiency 06/16/2011  . Hypertension 06/16/2011  . Migraine headache 06/16/2011   Past Medical History:  Diagnosis Date  . Fibrocystic breast disease   . Goiter   . Hyperlipidemia   . Migraine   . Osteopenia   . Vitamin D deficiency     Family History  Problem Relation Age of Onset  . Cancer Mother   . Cancer Father     Past Surgical History:  Procedure Laterality Date  . BREAST BIOPSY    . OOPHORECTOMY    . OVARIAN CYST REMOVAL     Social History   Occupational History  . Not on file  Tobacco Use  . Smoking status: Current Every Day Smoker    Packs/day: 0.40    Types: Cigarettes  . Smokeless tobacco: Never Used  Substance and Sexual Activity  . Alcohol use: Not on file  . Drug use: Not on file  . Sexual activity: Not on file

## 2017-12-19 ENCOUNTER — Other Ambulatory Visit: Payer: Self-pay | Admitting: Internal Medicine

## 2017-12-19 DIAGNOSIS — E785 Hyperlipidemia, unspecified: Secondary | ICD-10-CM

## 2017-12-25 ENCOUNTER — Ambulatory Visit (INDEPENDENT_AMBULATORY_CARE_PROVIDER_SITE_OTHER): Payer: Medicare Other | Admitting: Physician Assistant

## 2017-12-25 ENCOUNTER — Encounter (INDEPENDENT_AMBULATORY_CARE_PROVIDER_SITE_OTHER): Payer: Self-pay | Admitting: Physician Assistant

## 2017-12-25 ENCOUNTER — Other Ambulatory Visit: Payer: Medicare Other | Admitting: Internal Medicine

## 2017-12-25 DIAGNOSIS — M25511 Pain in right shoulder: Secondary | ICD-10-CM | POA: Diagnosis not present

## 2017-12-25 DIAGNOSIS — E785 Hyperlipidemia, unspecified: Secondary | ICD-10-CM

## 2017-12-25 LAB — HEPATIC FUNCTION PANEL
AG Ratio: 1.7 (calc) (ref 1.0–2.5)
ALT: 17 U/L (ref 6–29)
AST: 13 U/L (ref 10–35)
Albumin: 4.3 g/dL (ref 3.6–5.1)
Alkaline phosphatase (APISO): 82 U/L (ref 33–130)
BILIRUBIN DIRECT: 0.1 mg/dL (ref 0.0–0.2)
BILIRUBIN INDIRECT: 0.3 mg/dL (ref 0.2–1.2)
Globulin: 2.5 g/dL (calc) (ref 1.9–3.7)
TOTAL PROTEIN: 6.8 g/dL (ref 6.1–8.1)
Total Bilirubin: 0.4 mg/dL (ref 0.2–1.2)

## 2017-12-25 LAB — LIPID PANEL
CHOL/HDL RATIO: 4 (calc) (ref <5.0)
Cholesterol: 185 mg/dL (ref <200)
HDL: 46 mg/dL — AB (ref >50)
LDL Cholesterol (Calc): 110 mg/dL (calc) — ABNORMAL HIGH
NON-HDL CHOLESTEROL (CALC): 139 mg/dL — AB (ref <130)
TRIGLYCERIDES: 177 mg/dL — AB (ref <150)

## 2017-12-25 NOTE — Progress Notes (Signed)
Courtney Ryan returns today follow-up of her right shoulder 2 weeks status post injection.  States shoulder is much improved.  She has been doing home exercise program is shown.  She denies any radicular pain down the arm no waking pain in regards to the right shoulder.  States that her overall range of motion of the shoulder has improved.  Physical exam: General well-developed well-nourished female no acute distress Right shoulder: 5 out of 5 strength with external and internal rotation against resistance.  Impingement testing negative.  Empty can test she has slight weakness compared to the left side.  Impression: Right shoulder pain improved  Plan: She is given some Thera-Band and shown how to do external and internal rotation exercises with Thera-Band.  She will continue the pendulum, Codman and flexion exercises.  Follow-up on an as-needed basis pain returns or becomes worse.

## 2017-12-28 ENCOUNTER — Encounter: Payer: Self-pay | Admitting: Internal Medicine

## 2017-12-28 ENCOUNTER — Ambulatory Visit (INDEPENDENT_AMBULATORY_CARE_PROVIDER_SITE_OTHER): Payer: Medicare Other | Admitting: Internal Medicine

## 2017-12-28 VITALS — BP 120/70 | HR 65 | Ht 62.5 in | Wt 157.0 lb

## 2017-12-28 DIAGNOSIS — H9201 Otalgia, right ear: Secondary | ICD-10-CM | POA: Diagnosis not present

## 2017-12-28 DIAGNOSIS — R7302 Impaired glucose tolerance (oral): Secondary | ICD-10-CM

## 2017-12-28 DIAGNOSIS — E785 Hyperlipidemia, unspecified: Secondary | ICD-10-CM | POA: Diagnosis not present

## 2017-12-28 DIAGNOSIS — J069 Acute upper respiratory infection, unspecified: Secondary | ICD-10-CM

## 2017-12-28 MED ORDER — ROSUVASTATIN CALCIUM 5 MG PO TABS: ORAL_TABLET | ORAL | 11 refills | Status: DC

## 2017-12-28 MED ORDER — LEVOFLOXACIN 500 MG PO TABS: 500.0000 mg | ORAL_TABLET | Freq: Every day | ORAL | 0 refills | Status: DC

## 2017-12-28 NOTE — Progress Notes (Signed)
   Subjective:    Patient ID: Courtney Ryan, female    DOB: [birth year], 72 y.o.   MRN: [id number]  HPI 72 year old Female in today for 76-month follow-up on hyperlipidemia.  Doing smaller portions of food. On Crestor twice weekly with improvement with triglycerides from 224-177.  Total cholesterol is improved from 217-185.  LDL cholesterol has improved from 141-110.  I have convinced her to try Crestor 5 mg 3 times weekly going forward.  Liver functions are normal.  She is complaining of ear pain and congestion.  She has had this for several days.  When she blows her nose she is seeing a bit of blood.  Have asked her not to do that she may want to use some antibiotic ointment in her nostrils.  No fever or shaking chills.    Review of Systems her complaint of sore throat     Objective:   Physical Exam Skin warm and dry.  Nodes none.  TMs are slightly dull bilaterally but not red.  Neck is supple.  Has a tender slightly enlarged right anterior cervical node.  Neck supple.  Chest clear to auscultation.       Assessment & Plan:  Sore throat and right otalgia-consistent with respiratory infection.  Take Levaquin 500 mg daily for 10 days.  Impaired glucose tolerance-hemoglobin A1c 6.1% June 2018  Hyperlipidemia-increase Crestor to 3 times weekly and follow-up in June at 6 months checkup with lipid panel liver functions and hemoglobin A1c.

## 2017-12-28 NOTE — Patient Instructions (Signed)
Increase Crestor to 3 times weekly and return in June.  Given Levaquin 500 mg daily for 10 days for discolored nasal drainage respiratory congestion as well as right ear pain.  We will continue to watch diet and try to exercise.

## 2018-01-11 ENCOUNTER — Other Ambulatory Visit: Payer: Self-pay | Admitting: Internal Medicine

## 2018-02-25 ENCOUNTER — Other Ambulatory Visit: Payer: Self-pay | Admitting: Internal Medicine

## 2018-02-25 NOTE — Telephone Encounter (Signed)
Refill x 6 months 

## 2018-03-09 ENCOUNTER — Other Ambulatory Visit: Payer: Self-pay

## 2018-03-09 DIAGNOSIS — E119 Type 2 diabetes mellitus without complications: Secondary | ICD-10-CM

## 2018-03-09 DIAGNOSIS — I10 Essential (primary) hypertension: Secondary | ICD-10-CM

## 2018-03-09 DIAGNOSIS — R7302 Impaired glucose tolerance (oral): Secondary | ICD-10-CM

## 2018-03-09 DIAGNOSIS — E785 Hyperlipidemia, unspecified: Secondary | ICD-10-CM

## 2018-03-09 DIAGNOSIS — Z5181 Encounter for therapeutic drug level monitoring: Secondary | ICD-10-CM

## 2018-03-09 DIAGNOSIS — Z79899 Other long term (current) drug therapy: Secondary | ICD-10-CM

## 2018-03-15 ENCOUNTER — Telehealth: Payer: Self-pay

## 2018-03-15 NOTE — Telephone Encounter (Signed)
Left message letting her know that she is due for a mammogram and diabetic eye exam, provided with [facility] to schedule mammo.

## 2018-03-26 ENCOUNTER — Other Ambulatory Visit: Payer: Medicare Other | Admitting: Internal Medicine

## 2018-03-26 DIAGNOSIS — E119 Type 2 diabetes mellitus without complications: Secondary | ICD-10-CM

## 2018-03-26 DIAGNOSIS — I10 Essential (primary) hypertension: Secondary | ICD-10-CM

## 2018-03-26 DIAGNOSIS — R7302 Impaired glucose tolerance (oral): Secondary | ICD-10-CM

## 2018-03-26 DIAGNOSIS — Z79899 Other long term (current) drug therapy: Secondary | ICD-10-CM

## 2018-03-26 DIAGNOSIS — E785 Hyperlipidemia, unspecified: Secondary | ICD-10-CM

## 2018-03-26 DIAGNOSIS — Z5181 Encounter for therapeutic drug level monitoring: Secondary | ICD-10-CM

## 2018-03-27 LAB — HEPATIC FUNCTION PANEL
AG RATIO: 1.5 (calc) (ref 1.0–2.5)
ALT: 16 U/L (ref 6–29)
AST: 15 U/L (ref 10–35)
Albumin: 4.3 g/dL (ref 3.6–5.1)
Alkaline phosphatase (APISO): 88 U/L (ref 33–130)
Bilirubin, Direct: 0.1 mg/dL (ref 0.0–0.2)
GLOBULIN: 2.8 g/dL (ref 1.9–3.7)
Indirect Bilirubin: 0.4 mg/dL (calc) (ref 0.2–1.2)
TOTAL PROTEIN: 7.1 g/dL (ref 6.1–8.1)
Total Bilirubin: 0.5 mg/dL (ref 0.2–1.2)

## 2018-03-27 LAB — LIPID PANEL
Cholesterol: 163 mg/dL (ref <200)
HDL: 46 mg/dL — ABNORMAL LOW (ref >50)
LDL CHOLESTEROL (CALC): 92 mg/dL
NON-HDL CHOLESTEROL (CALC): 117 mg/dL (ref <130)
TRIGLYCERIDES: 146 mg/dL (ref <150)
Total CHOL/HDL Ratio: 3.5 (calc) (ref <5.0)

## 2018-03-27 LAB — MICROALBUMIN / CREATININE URINE RATIO
CREATININE, URINE: 156 mg/dL (ref 20–275)
Microalb Creat Ratio: 5 mcg/mg creat (ref <30)
Microalb, Ur: 0.8 mg/dL

## 2018-03-27 LAB — HEMOGLOBIN A1C
EAG (MMOL/L): 7.4 (calc)
Hgb A1c MFr Bld: 6.3 % of total Hgb — ABNORMAL HIGH (ref <5.7)
MEAN PLASMA GLUCOSE: 134 (calc)

## 2018-03-29 ENCOUNTER — Encounter: Payer: Self-pay | Admitting: Internal Medicine

## 2018-03-29 ENCOUNTER — Ambulatory Visit (INDEPENDENT_AMBULATORY_CARE_PROVIDER_SITE_OTHER): Payer: Medicare Other | Admitting: Internal Medicine

## 2018-03-29 VITALS — BP 110/70 | HR 71 | Temp 98.1°F | Ht 62.5 in | Wt 159.0 lb

## 2018-03-29 DIAGNOSIS — I10 Essential (primary) hypertension: Secondary | ICD-10-CM

## 2018-03-29 DIAGNOSIS — E785 Hyperlipidemia, unspecified: Secondary | ICD-10-CM | POA: Diagnosis not present

## 2018-03-29 DIAGNOSIS — Z0000 Encounter for general adult medical examination without abnormal findings: Secondary | ICD-10-CM

## 2018-03-29 DIAGNOSIS — F411 Generalized anxiety disorder: Secondary | ICD-10-CM

## 2018-03-29 DIAGNOSIS — Z87891 Personal history of nicotine dependence: Secondary | ICD-10-CM

## 2018-03-29 DIAGNOSIS — R7302 Impaired glucose tolerance (oral): Secondary | ICD-10-CM | POA: Diagnosis not present

## 2018-03-29 DIAGNOSIS — E039 Hypothyroidism, unspecified: Secondary | ICD-10-CM

## 2018-03-29 DIAGNOSIS — J0100 Acute maxillary sinusitis, unspecified: Secondary | ICD-10-CM

## 2018-03-29 DIAGNOSIS — E8881 Metabolic syndrome: Secondary | ICD-10-CM

## 2018-03-29 LAB — TSH: TSH: 0.77 mIU/L (ref 0.40–4.50)

## 2018-03-29 MED ORDER — LEVOFLOXACIN 500 MG PO TABS: 500.0000 mg | ORAL_TABLET | Freq: Every day | ORAL | 0 refills | Status: DC

## 2018-03-29 NOTE — Progress Notes (Signed)
   Subjective:    Patient ID: Courtney Ryan, female    DOB: 07-14-1946, 72 y.o.   MRN: [id number]  HPI For 6 month recheck on HTN, impaired glucose tolerance and hyperlipidemia. AIC has increased slightly Advised watch consumption of bread pasta and fruit  Also complaining of sinus pressure and right maxillary sinus tenderness. Some discolored drainage.  Usually treated with Levaquin.  No fever or shaking chills.  History of hypothyroidism currently on Synthroid 0.075 mg daily.  TSH drawn and pending today.  Hemoglobin A1c 6.3% in 1 year ago was 6.1%.  Liver panel is normal.  She has a low HDL cholesterol of 46 but normal total cholesterol, LDL cholesterol and triglycerides.   Review of Systems see above     Objective:   Physical Exam Skin warm and dry.  Nodes none.  Pharynx very slightly injected.  TMs are clear.  Neck is supple without thyromegaly.  Chest clear to auscultation without rales or wheezing.  No lower extremity edema.  Cardiac exam regular rate and rhythm.       Assessment & Plan:  Acute maxillary sinusitis-treat with Levaquin 500 mg daily for 10 days  History of anxiety state-Xanax has been prescribed for this And she takes this sparingly   Essential hypertension treated with amlodipine, furosemide, and Tenormin- stable  Hyperlipidemia-responding well to Crestor 3 times weekly.  LDL was 110 and is now 92.  Triglycerides were 177 are now 146.  Hypothyroidism- currently on Synthroid 0.075 mg daily  TSH drawn and pending today.   History of smoking-not willing to quit  Impaired glucose tolerance-hemoglobin A1c 6.3% and previously was 6.1%.  Discussion regarding diet including foods that are carbohydrates  Metabolic syndrome  Plan: Return in 6 months for physical examination or as needed.  Continue Crestor 5 mg 3 times a week.  She continues to smoke about 5 cigarettes a day and is not interested in quitting.  Need to address once again need for pneumonia  vaccines.  She will be contacted.  She almost always has a sinus infection when she comes in and we tend to defer pneumococcal vaccines because of that

## 2018-03-29 NOTE — Patient Instructions (Signed)
TSH drawn and pending.  To consider pneumococcal vaccine in the near future.  Take Levaquin for acute sinusitis symptoms  for 10 days.  Continue other medications as previously prescribed.  Watch amounts of carbohydrates.  Return in 6 months for physical examination.

## 2018-04-09 ENCOUNTER — Ambulatory Visit
Admission: RE | Admit: 2018-04-09 | Discharge: 2018-04-09 | Disposition: A | Discharge status: Home / Self Care | Payer: Medicare Other | Source: Ambulatory Visit | Attending: Internal Medicine | Admitting: Internal Medicine

## 2018-04-09 DIAGNOSIS — Z0000 Encounter for general adult medical examination without abnormal findings: Secondary | ICD-10-CM

## 2018-04-11 ENCOUNTER — Other Ambulatory Visit: Payer: Self-pay | Admitting: Internal Medicine

## 2018-05-25 ENCOUNTER — Encounter: Payer: Self-pay | Admitting: Internal Medicine

## 2018-06-03 ENCOUNTER — Other Ambulatory Visit: Payer: Self-pay | Admitting: Internal Medicine

## 2018-06-21 ENCOUNTER — Other Ambulatory Visit: Payer: Self-pay | Admitting: Internal Medicine

## 2018-07-08 ENCOUNTER — Other Ambulatory Visit: Payer: Self-pay | Admitting: Internal Medicine

## 2018-07-09 ENCOUNTER — Other Ambulatory Visit: Payer: Self-pay | Admitting: Internal Medicine

## 2018-08-31 ENCOUNTER — Other Ambulatory Visit: Payer: Self-pay | Admitting: Internal Medicine

## 2018-09-18 ENCOUNTER — Encounter: Payer: Self-pay | Admitting: Internal Medicine

## 2018-09-18 ENCOUNTER — Other Ambulatory Visit (INDEPENDENT_AMBULATORY_CARE_PROVIDER_SITE_OTHER): Payer: Medicare Other | Admitting: Internal Medicine

## 2018-09-18 ENCOUNTER — Ambulatory Visit (INDEPENDENT_AMBULATORY_CARE_PROVIDER_SITE_OTHER): Payer: Medicare Other | Admitting: Internal Medicine

## 2018-09-18 VITALS — BP 120/80 | HR 66 | Temp 98.1°F | Ht 62.5 in | Wt 158.0 lb

## 2018-09-18 DIAGNOSIS — F419 Anxiety disorder, unspecified: Secondary | ICD-10-CM

## 2018-09-18 DIAGNOSIS — J0100 Acute maxillary sinusitis, unspecified: Secondary | ICD-10-CM | POA: Diagnosis not present

## 2018-09-18 DIAGNOSIS — N6019 Diffuse cystic mastopathy of unspecified breast: Secondary | ICD-10-CM

## 2018-09-18 DIAGNOSIS — Z0000 Encounter for general adult medical examination without abnormal findings: Secondary | ICD-10-CM

## 2018-09-18 DIAGNOSIS — E119 Type 2 diabetes mellitus without complications: Secondary | ICD-10-CM

## 2018-09-18 DIAGNOSIS — M8580 Other specified disorders of bone density and structure, unspecified site: Secondary | ICD-10-CM

## 2018-09-18 DIAGNOSIS — I10 Essential (primary) hypertension: Secondary | ICD-10-CM

## 2018-09-18 DIAGNOSIS — E785 Hyperlipidemia, unspecified: Secondary | ICD-10-CM

## 2018-09-18 LAB — POCT URINALYSIS DIPSTICK
Appearance: NEGATIVE
Bilirubin, UA: NEGATIVE
Blood, UA: NEGATIVE
Glucose, UA: NEGATIVE
Ketones, UA: NEGATIVE
Leukocytes, UA: NEGATIVE
NITRITE UA: NEGATIVE
Odor: NEGATIVE
Protein, UA: POSITIVE — AB
Spec Grav, UA: 1.015 (ref 1.010–1.025)
Urobilinogen, UA: 0.2 E.U./dL
pH, UA: 6 (ref 5.0–8.0)

## 2018-09-18 MED ORDER — LEVOFLOXACIN 500 MG PO TABS: 500.0000 mg | ORAL_TABLET | Freq: Every day | ORAL | 0 refills | Status: DC

## 2018-09-18 MED ORDER — PREDNISONE 10 MG PO TABS: ORAL_TABLET | ORAL | 0 refills | Status: DC

## 2018-09-18 NOTE — Progress Notes (Signed)
   Subjective:    Patient ID: Courtney Ryan, female    DOB: 24-Jul-1946, 71 y.o.   MRN: [id number]  HPI 72 year old Female in today with complaint of cough maxillary sinus pressure and congestion.  No fever or shaking chills.  Has had similar issues previously.  Last bout was in June 2019.  She has impaired glucose tolerance hypertension and hyperlipidemia.    Review of Systems history of allergic rhinitis and has seen ENT physician in 2017 for chronic sinusitis issues     Objective:   Physical Exam  Skin warm and dry.  Nodes none.  Vital signs stable and reviewed.  She is afebrile.  TMs are clear.  Pharynx is very slightly injected.  She sounds nasally congested.  Neck supple.  Chest clear to auscultation.      Assessment & Plan:  Acute maxillary sinusitis  Plan: Levaquin 500 mg daily for 10 days.  Prednisone 10 mg tablets going from 60 mg to 0 mg over 7 days.  This regimen works well for her.

## 2018-09-18 NOTE — Addendum Note (Signed)
Addended by: Mady Haagensen on: 09/18/2018 10:34 AM   Modules accepted: Orders

## 2018-09-19 LAB — CBC WITH DIFFERENTIAL/PLATELET
ABSOLUTE MONOCYTES: 727 {cells}/uL (ref 200–950)
Basophils Absolute: 91 cells/uL (ref 0–200)
Basophils Relative: 0.9 %
Eosinophils Absolute: 51 cells/uL (ref 15–500)
Eosinophils Relative: 0.5 %
HEMATOCRIT: 46.6 % — AB (ref 35.0–45.0)
Hemoglobin: 15 g/dL (ref 11.7–15.5)
LYMPHS ABS: 2565 {cells}/uL (ref 850–3900)
MCH: 28.6 pg (ref 27.0–33.0)
MCHC: 32.2 g/dL (ref 32.0–36.0)
MCV: 88.9 fL (ref 80.0–100.0)
MPV: 9.2 fL (ref 7.5–12.5)
Monocytes Relative: 7.2 %
NEUTROS PCT: 66 %
Neutro Abs: 6666 cells/uL (ref 1500–7800)
Platelets: 346 10*3/uL (ref 140–400)
RBC: 5.24 10*6/uL — ABNORMAL HIGH (ref 3.80–5.10)
RDW: 14.1 % (ref 11.0–15.0)
Total Lymphocyte: 25.4 %
WBC: 10.1 10*3/uL (ref 3.8–10.8)

## 2018-09-19 LAB — COMPLETE METABOLIC PANEL WITH GFR
AG RATIO: 1.6 (calc) (ref 1.0–2.5)
ALKALINE PHOSPHATASE (APISO): 98 U/L (ref 33–130)
ALT: 16 U/L (ref 6–29)
AST: 14 U/L (ref 10–35)
Albumin: 4.4 g/dL (ref 3.6–5.1)
BILIRUBIN TOTAL: 0.4 mg/dL (ref 0.2–1.2)
BUN/Creatinine Ratio: 19 (calc) (ref 6–22)
BUN: 20 mg/dL (ref 7–25)
CO2: 31 mmol/L (ref 20–32)
Calcium: 11 mg/dL — ABNORMAL HIGH (ref 8.6–10.4)
Chloride: 104 mmol/L (ref 98–110)
Creat: 1.05 mg/dL — ABNORMAL HIGH (ref 0.60–0.93)
GFR, EST NON AFRICAN AMERICAN: 52 mL/min/{1.73_m2} — AB (ref >=60)
GFR, Est African American: 61 mL/min/{1.73_m2} (ref >=60)
GLOBULIN: 2.8 g/dL (ref 1.9–3.7)
Glucose, Bld: 102 mg/dL — ABNORMAL HIGH (ref 65–99)
POTASSIUM: 4.6 mmol/L (ref 3.5–5.3)
SODIUM: 141 mmol/L (ref 135–146)
Total Protein: 7.2 g/dL (ref 6.1–8.1)

## 2018-09-19 LAB — LIPID PANEL
CHOLESTEROL: 182 mg/dL (ref <200)
HDL: 50 mg/dL — AB (ref >50)
LDL Cholesterol (Calc): 103 mg/dL (calc) — ABNORMAL HIGH
NON-HDL CHOLESTEROL (CALC): 132 mg/dL — AB (ref <130)
Total CHOL/HDL Ratio: 3.6 (calc) (ref <5.0)
Triglycerides: 173 mg/dL — ABNORMAL HIGH (ref <150)

## 2018-09-19 LAB — MICROALBUMIN / CREATININE URINE RATIO
CREATININE, URINE: 267 mg/dL (ref 20–275)
MICROALB UR: 1.4 mg/dL
Microalb Creat Ratio: 5 mcg/mg creat (ref <30)

## 2018-09-19 LAB — TSH: TSH: 0.91 mIU/L (ref 0.40–4.50)

## 2018-09-19 LAB — HEMOGLOBIN A1C
EAG (MMOL/L): 7.6 (calc)
Hgb A1c MFr Bld: 6.4 % of total Hgb — ABNORMAL HIGH (ref <5.7)
MEAN PLASMA GLUCOSE: 137 (calc)

## 2018-09-21 ENCOUNTER — Encounter: Payer: Self-pay | Admitting: Internal Medicine

## 2018-09-21 ENCOUNTER — Ambulatory Visit (INDEPENDENT_AMBULATORY_CARE_PROVIDER_SITE_OTHER): Payer: Medicare Other | Admitting: Internal Medicine

## 2018-09-21 VITALS — BP 120/60 | HR 72 | Ht 62.5 in | Wt 159.0 lb

## 2018-09-21 DIAGNOSIS — E039 Hypothyroidism, unspecified: Secondary | ICD-10-CM | POA: Diagnosis not present

## 2018-09-21 DIAGNOSIS — Z0000 Encounter for general adult medical examination without abnormal findings: Secondary | ICD-10-CM | POA: Diagnosis not present

## 2018-09-21 DIAGNOSIS — R7302 Impaired glucose tolerance (oral): Secondary | ICD-10-CM

## 2018-09-21 DIAGNOSIS — Z87891 Personal history of nicotine dependence: Secondary | ICD-10-CM

## 2018-09-21 DIAGNOSIS — E8881 Metabolic syndrome: Secondary | ICD-10-CM

## 2018-09-21 DIAGNOSIS — E782 Mixed hyperlipidemia: Secondary | ICD-10-CM

## 2018-09-21 DIAGNOSIS — Z8639 Personal history of other endocrine, nutritional and metabolic disease: Secondary | ICD-10-CM

## 2018-09-21 DIAGNOSIS — I10 Essential (primary) hypertension: Secondary | ICD-10-CM | POA: Diagnosis not present

## 2018-09-21 DIAGNOSIS — J0101 Acute recurrent maxillary sinusitis: Secondary | ICD-10-CM

## 2018-09-21 DIAGNOSIS — R609 Edema, unspecified: Secondary | ICD-10-CM

## 2018-09-21 NOTE — Progress Notes (Signed)
Subjective:    Patient ID: Courtney Ryan, female    DOB: 1946/02/27, 72 y.o.   MRN: [id number]  HPI  72 year old Female for health maintenance exam. Medicare wellness exam, and evaluation of medical issues. Sinus infection improving with antibiotics.  Review of fasting labs shows her calcium to be 11 and was 10.6 a year ago.  Calcium was 10.2 in 2017.  This represents a substantial increase and we will check intact PTH with further recommendations to follow.  She has a history of dependent edema, hypertension, hyperlipidemia, fibrocystic breast disease, hypothyroidism with history of goiter, history of migraine headaches, osteoarthritis, osteopenia and vitamin D deficiency.  She has had issues with recurrent sinusitis and has seen ENT physician.  We treated her for sinusitis in November with Levaquin and prednisone and just recently for recurrent episode a few days ago.  She is allergic to penicillin and sulfa.  Sulfa causes hives and rash.  Penicillin causes vomiting.  She saw Dr. Zollie Beckers in December 2017 for trochanteric bursitis of left hip.  He did x-ray of her hip December 2017 and hip joint was unremarkable.  History of bacterial vaginosis.  In March 2018 she saw Dr. Fredna Dow for trigger finger of right thumb.  Past medical history: 1968 she had an ovary removed because of the cyst.  In the 1970s she had an abnormal nevus removed from left calf.  She had colonoscopy in 2006 in [place].  Right breast biopsy January 2009 which was benign.    Also in 2017 she was complaining of right groin and right lower quadrant pain.  CT of the abdomen and pelvis were performed.  She was found to have fatty liver and a minimal ventral hernia containing only fat.  She had a left adrenal adenoma.  She had aortoiliac atherosclerosis.  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  No demonstrable mass or adenopathy in the abdomen.  Social history: She smokes about 5 cigarettes daily.  Drinks wine a couple  of times a week.  She and her husband are retired.  She has a college degree.  No children.  Family history: Father died at age 45 of lung cancer.  Mother died at age 27 with cancer of the liver in 8.  She has a history of irritable bowel syndrome.              Review of Systems 7 pound weight gain. Diet discussed.  History of recurrent sinusitis.  Recently treated with antibiotics and prednisone and is improving.  No history of palpitations or tremor.    Objective:   Physical Exam Vitals signs reviewed.  Constitutional:      General: She is not in acute distress.    Appearance: Normal appearance. She is not ill-appearing.  HENT:     Head: Normocephalic and atraumatic.     Right Ear: Tympanic membrane, ear canal and external ear normal. There is no impacted cerumen.     Left Ear: Tympanic membrane, ear canal and external ear normal. There is no impacted cerumen.     Nose: No congestion or rhinorrhea.     Mouth/Throat:     Mouth: Mucous membranes are moist.     Pharynx: Oropharynx is clear. No oropharyngeal exudate.  Eyes:     General:        Right eye: No discharge.        Left eye: No discharge.     Extraocular Movements: Extraocular movements intact.     Conjunctiva/sclera:  Conjunctivae normal.     Pupils: Pupils are equal, round, and reactive to light.  Neck:     Musculoskeletal: Neck supple. No neck rigidity.  Cardiovascular:     Rate and Rhythm: Bradycardia present. Rhythm irregular.     Pulses: Normal pulses.     Heart sounds: No murmur.  Pulmonary:     Effort: Pulmonary effort is normal. No respiratory distress.     Breath sounds: Normal breath sounds. No wheezing.  Abdominal:     General: Bowel sounds are normal. There is no distension.     Palpations: There is no mass.     Tenderness: There is no abdominal tenderness. There is no guarding or rebound.     Hernia: No hernia is present.  Musculoskeletal:        General: No swelling or deformity.    Lymphadenopathy:     Cervical: No cervical adenopathy.  Skin:    General: Skin is warm and dry.     Coloration: Skin is not jaundiced or pale.     Findings: No bruising, erythema, lesion or rash.  Neurological:     General: No focal deficit present.     Mental Status: She is alert. She is disoriented.     Cranial Nerves: No cranial nerve deficit.     Sensory: No sensory deficit.     Motor: No weakness.     Coordination: Coordination normal.     Gait: Gait normal.  Psychiatric:        Mood and Affect: Mood normal.        Thought Content: Thought content normal.        Judgment: Judgment normal.           Assessment & Plan:  Elevated serum calcium- to get intact PTH  Addendum: Intact PTH elevated at 70 with calcium 10.5--- order parathyroid scan.  Maxillary sinusitis- improving with antibiotics and steroids.  Hypothyroidism-stable on thyroid replacement  Dependent edema treated with Lasix  Hypertension stable on 2 drug regimen and Lasix  Hyperlipidemia-have convinced her to try Crestor 5 mg twice a week in December 2018.  However triglycerides are elevated at 173 with an LDL of 103.  Total cholesterol is 182.  Impaired glucose tolerance-hemoglobin A1c 6.4%-continue to watch diet and try to get more exercise.  Follow-up in 6 months.  Generally runs around 6.1%.  Does not want to be on medication.  History of left trochanteric bursitis  History of vitamin D deficiency-advise discontinuing vitamin D supplementation until we can clear up hypercalcemia issue  Irritable bowel syndrome  History of bacterial vaginosis  History of fibrocystic breast disease  Plan: Patient is to have a parathyroid hormone assay for hypercalcemia.  Addendum: Intact PTH is 70 normal being between 14 and 64.  Repeat calcium 10.5 normal being up to 10.4.  Would like to pursue nuclear medicine study for hyperparathyroidism.  Will need insurance approval.  Subjective:   Patient presents for  Medicare Annual/Subsequent preventive examination.  Review Past Medical/Family/Social: See above   Risk Factors  Current exercise habits: Some light exercise Dietary issues discussed: Low-fat low carbohydrate  Cardiac risk factors: Hyperlipidemia and impaired glucose tolerance  Depression Screen  (Note: if answer to either of the following is "Yes", a more complete depression screening is indicated)   Over the past two weeks, have you felt down, depressed or hopeless? No  Over the past two weeks, have you felt little interest or pleasure in doing things? No Have you lost interest  or pleasure in daily life? No Do you often feel hopeless? No Do you cry easily over simple problems? No   Activities of Daily Living  In your present state of health, do you have any difficulty performing the following activities?:   Driving? No  Managing money? No  Feeding yourself? No  Getting from bed to chair? No  Climbing a flight of stairs? No  Preparing food and eating?: No  Bathing or showering? No  Getting dressed: No  Getting to the toilet? No  Using the toilet:No  Moving around from place to place: No  In the past year have you fallen or had a near fall?:No  Are you sexually active? No  Do you have more than one partner? No   Hearing Difficulties: Yes Do you often ask people to speak up or repeat themselves?  Yes Do you experience ringing or noises in your ears?  Yes Do you have difficulty understanding soft or whispered voices?  Yes Do you feel that you have a problem with memory?  Occasionally Do you often misplace items?  Sometimes   Home Safety:  Do you have a smoke alarm at your residence? Yes Do you have grab bars in the bathroom?  Yes Do you have throw rugs in your house?  Yes   Cognitive Testing  Alert? Yes Normal Appearance?Yes  Oriented to person? Yes Place? Yes  Time? Yes  Recall of three objects? Yes  Can perform simple calculations? Yes  Displays appropriate  judgment?Yes  Can read the correct time from a watch face?Yes   List the Names of Other Physician/Practitioners you currently use:  See referral list for the physicians patient is currently seeing.     Review of Systems: See above    Objective:     General appearance: Appears stated age and mildly obese  Head: Normocephalic, without obvious abnormality, atraumatic  Eyes: conj clear, EOMi PEERLA  Ears: normal TM's and external ear canals both ears  Nose: Nares normal. Septum midline. Mucosa normal. No drainage or sinus tenderness.  Throat: lips, mucosa, and tongue normal; teeth and gums normal  Neck: no adenopathy, no carotid bruit, no JVD, supple, symmetrical, trachea midline and thyroid not enlarged, symmetric, no tenderness/mass/nodules  No CVA tenderness.  Lungs: clear to auscultation bilaterally  Breasts: normal appearance, no masses or tenderness Heart: regular rate and rhythm, S1, S2 normal, no murmur, click, rub or gallop  Abdomen: soft, non-tender; bowel sounds normal; no masses, no organomegaly  Musculoskeletal: ROM normal in all joints, no crepitus, no deformity, Normal muscle strengthen. Back  is symmetric, no curvature. Skin: Skin color, texture, turgor normal. No rashes or lesions  Lymph nodes: Cervical, supraclavicular, and axillary nodes normal.  Neurologic: CN 2 -12 Normal, Normal symmetric reflexes. Normal coordination and gait  Psych: Alert & Oriented x 3, Mood appear stable.    Assessment:    Annual wellness medicare exam   Plan:    During the course of the visit the patient was educated and counseled about appropriate screening and preventive services including:  Annual mammogram  Spoke with her about Cologuard      Patient Instructions (the written plan) was given to the patient.  Medicare Attestation  I have personally reviewed:  The patient's medical and social history  Their use of alcohol, tobacco or illicit drugs  Their current  medications and supplements  The patient's functional ability including ADLs,fall risks, home safety risks, cognitive, and hearing and visual impairment  Diet and physical  activities  Evidence for depression or mood disorders  The patient's weight, height, BMI, and visual acuity have been recorded in the chart. I have made referrals, counseling, and provided education to the patient based on review of the above and I have provided the patient with a written personalized care plan for preventive services.

## 2018-09-24 ENCOUNTER — Other Ambulatory Visit: Payer: Medicare Other | Admitting: Internal Medicine

## 2018-09-24 DIAGNOSIS — E039 Hypothyroidism, unspecified: Secondary | ICD-10-CM

## 2018-09-25 LAB — PTH, INTACT AND CALCIUM
Calcium: 10.5 mg/dL — ABNORMAL HIGH (ref 8.6–10.4)
PTH: 70 pg/mL — ABNORMAL HIGH (ref 14–64)

## 2018-09-27 ENCOUNTER — Encounter: Payer: Self-pay | Admitting: Internal Medicine

## 2018-09-27 ENCOUNTER — Telehealth: Payer: Self-pay | Admitting: Internal Medicine

## 2018-09-27 DIAGNOSIS — E213 Hyperparathyroidism, unspecified: Secondary | ICD-10-CM

## 2018-09-27 NOTE — Telephone Encounter (Signed)
Left message for pt to call about results. Has mild elevation of calcium and parathyroid hormone. Needs nuclear medicine test which I have ordered already at [facility] to determine if she has a benign parathyroid tumor.

## 2018-09-30 NOTE — Patient Instructions (Signed)
Levaquin 500 mg daily for 10 days.  Prednisone 10 mg tablets going from 60 mg to 0 mg over 7 days.  Keep appointment for upcoming physical.

## 2018-10-04 ENCOUNTER — Other Ambulatory Visit: Payer: Self-pay | Admitting: Internal Medicine

## 2018-10-11 ENCOUNTER — Encounter (HOSPITAL_COMMUNITY): Payer: Medicare Other

## 2018-10-11 ENCOUNTER — Ambulatory Visit (HOSPITAL_COMMUNITY): Payer: Medicare Other

## 2018-10-13 NOTE — Patient Instructions (Signed)
Your serum calcium is elevated at 11.  We need to repeat the serum calcium and drawl and intact parathyroid hormone level.  Further instructions to follow.  Please work on diet and exercise.  Hemoglobin A1c is elevated at 6.4%.  Follow-up in 6 months.

## 2018-10-18 ENCOUNTER — Telehealth: Payer: Self-pay | Admitting: Internal Medicine

## 2018-10-18 ENCOUNTER — Encounter (HOSPITAL_COMMUNITY)
Admission: RE | Admit: 2018-10-18 | Discharge: 2018-10-18 | Disposition: A | Discharge status: Home / Self Care | Payer: Medicare Other | Source: Ambulatory Visit | Attending: Internal Medicine | Admitting: Internal Medicine

## 2018-10-18 ENCOUNTER — Other Ambulatory Visit: Payer: Self-pay | Admitting: Internal Medicine

## 2018-10-18 DIAGNOSIS — E213 Hyperparathyroidism, unspecified: Secondary | ICD-10-CM

## 2018-10-18 DIAGNOSIS — E210 Primary hyperparathyroidism: Secondary | ICD-10-CM | POA: Insufficient documentation

## 2018-10-18 MED ORDER — TECHNETIUM TC 99M SESTAMIBI - CARDIOLITE
27.1000 | Freq: Once | INTRAVENOUS | Status: AC | PRN
  Administered 2018-10-18: 27.1 via INTRAVENOUS

## 2018-10-18 NOTE — Telephone Encounter (Signed)
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 She is asymptomatic. Will refer to Endocrinology for consultation re: need for surgery. Pt understands and agrees with consult.

## 2018-11-06 ENCOUNTER — Ambulatory Visit: Payer: Medicare Other | Admitting: Internal Medicine

## 2018-11-06 ENCOUNTER — Encounter: Payer: Self-pay | Admitting: Internal Medicine

## 2018-11-06 VITALS — BP 130/62 | HR 66 | Ht 62.5 in | Wt 162.0 lb

## 2018-11-06 DIAGNOSIS — E210 Primary hyperparathyroidism: Secondary | ICD-10-CM | POA: Diagnosis not present

## 2018-11-06 DIAGNOSIS — E559 Vitamin D deficiency, unspecified: Secondary | ICD-10-CM | POA: Diagnosis not present

## 2018-11-06 LAB — VITAMIN D 25 HYDROXY (VIT D DEFICIENCY, FRACTURES): VITD: 32.26 ng/mL (ref 30.00–100.00)

## 2018-11-06 NOTE — Progress Notes (Addendum)
Patient ID: Courtney Ryan, female   DOB: Dec 29, 1945, 73 y.o.   MRN: [id number]    HPI  Courtney Ryan is a 73 y.o.-year-old female, referred by her PCP, Dr. Renold Genta, for management of hypercalcemia/hyperparathyroidism.  She is here with her husband.  Pt was dx with hypercalcemia in 09/2017.  A PTH level was also high.  I reviewed pt's pertinent labs: Lab Results  Component Value Date   PTH 70 (H) 09/24/2018   CALCIUM 10.5 (H) 09/24/2018   CALCIUM 11.0 (H) 09/18/2018   CALCIUM 10.6 (H) 09/18/2017   CALCIUM 10.2 03/03/2017   CALCIUM 10.2 08/29/2016   CALCIUM 9.9 03/22/2016   CALCIUM 9.9 08/18/2015   CALCIUM 10.1 08/15/2014   CALCIUM 10.5 04/10/2014   CALCIUM 10.1 04/01/2014   Of note, she recently had a technetium sestamibi parathyroid scan (10/18/2018) which showed a right inferior parathyroid adenoma.  I reviewed pt's DXA scans - osteopenia per 2014 scan: Date L1-L4 T score FN T score 33% distal radius  Ultra distal radius  01/23/2013 n/a  LFN: -1.2  -1.0  -0.7  10-year major osteoporosis fracture risk: 9.2%, 10-year hip fracture risk: 1.6%.  No fractures or falls.   No h/o kidney stones.  + Mild CKD. Last BUN/Cr: Lab Results  Component Value Date   BUN 20 09/18/2018   BUN 20 09/18/2017   CREATININE 1.05 (H) 09/18/2018   CREATININE 1.00 (H) 09/18/2017   Pt is not on HCTZ.  + h/o vitamin D deficiency. Reviewed vit D levels: Lab Results  Component Value Date   VD25OH 30 09/18/2017   VD25OH 25 (L) 08/29/2016   VD25OH 24 (L) 08/18/2015   VD25OH 35 08/15/2014   VD25OH 50 07/01/2013   VD25OH 32 06/19/2012   VD25OH 38 06/14/2011   Pt is off vitamin D supplements, but she was taking this in the past.  She is not taking calcium.  Pt does not have a FH of hypercalcemia, pituitary tumors, thyroid cancer, or osteoporosis.   Pt. also has a history of hypothyroidism and is on Synthroid 75 mcg daily.  Latest TSH was reviewed and this was normal: Lab Results  Component Value  Date   TSH 0.91 09/18/2018   She recently had a prednisone taper.  She also has a history of HTN, HL, hypothyroidism, GERD, sinus infections, nose polyp.  ROS: Constitutional: + weight gain, + fatigue, + hot flushes, + poor sleep, + nocturia Eyes: + blurry vision, no xerophthalmia ENT: + Sore throat, no nodules palpated in throat, no dysphagia/odynophagia, no hoarseness, + tinnitus Cardiovascular: no CP/+ SOB/no palpitations/+ leg swelling Respiratory: + Cough/+ SOB Gastrointestinal: no N/no V/+ D/no C, + heartburn Musculoskeletal: + muscle aches/+ joint aches Skin: no rashes, + itching Neurological: no tremors/numbness/tingling/dizziness, + HA Psychiatric: + both: depression/anxiety  Past Medical History:  Diagnosis Date  . Fibrocystic breast disease   . Goiter   . Hyperlipidemia   . Migraine   . Osteopenia   . Vitamin D deficiency    Past Surgical History:  Procedure Laterality Date  . BREAST BIOPSY    . OOPHORECTOMY    . OVARIAN CYST REMOVAL     Social History   Socioeconomic History  . Marital status: Married    Spouse name: Not on file  . Number of children: 0  . Years of education: Not on file  . Highest education level: Not on file  Occupational History  .  Retired  Scientific laboratory technician  . Financial resource strain: Not on file  .  Food insecurity:    Worry: Not on file    Inability: Not on file  . Transportation needs:    Medical: Not on file    Non-medical: Not on file  Tobacco Use  . Smoking status: Current Every Day Smoker    Packs/day: 0.40    Types: Cigarettes  . Smokeless tobacco: Never Used  Substance and Sexual Activity  . Alcohol use:  2-3 drinks monthly  . Drug use: No   Current Outpatient Medications on File Prior to Visit  Medication Sig Dispense Refill  . ALPRAZolam (XANAX) 0.25 MG tablet TAKE 1 TABLET BY MOUTH TWICE DAILY AS NEEDED FOR ANXIETY 60 tablet 1  . amLODipine (NORVASC) 5 MG tablet TAKE 1 TABLET BY MOUTH ONCE DAILY 90 tablet 0  .  aspirin 325 MG tablet Take 650 mg by mouth daily.    Marland Kitchen atenolol (TENORMIN) 25 MG tablet TAKE 1 TABLET BY MOUTH ONCE DAILY 90 tablet 3  . conjugated estrogens (PREMARIN) vaginal cream Place 1 Applicatorful vaginally at bedtime as needed. Pt uses it twice a week 30 g 12  . furosemide (LASIX) 40 MG tablet TAKE 1 TABLET BY MOUTH ONCE DAILY 90 tablet 1  . levofloxacin (LEVAQUIN) 500 MG tablet Take 1 tablet (500 mg total) by mouth daily. 10 tablet 0  . loratadine (CLARITIN) 10 MG tablet Take 10 mg by mouth daily.      . predniSONE (DELTASONE) 10 MG tablet Take in tapering course as directed 6-5-4-3-2-1 21 tablet 0  . rosuvastatin (CRESTOR) 5 MG tablet One po 3 times a week on  for hyperlipidemia. Take with supper. 12 tablet 11  . SYNTHROID 75 MCG tablet TAKE 1 TABLET BY MOUTH ONCE DAILY 90 tablet 2  . ACCU-CHEK AVIVA PLUS test strip   0  . ACCU-CHEK SOFTCLIX LANCETS lancets   0  . Blood Glucose Monitoring Suppl (ACCU-CHEK AVIVA PLUS) W/DEVICE KIT   0   No current facility-administered medications on file prior to visit.    Allergies  Allergen Reactions  . Penicillins Nausea And Vomiting  . Sulfa Antibiotics Hives and Rash  . Clindamycin/Lincomycin Rash   Family History  Problem Relation Age of Onset  . Cancer Mother   . Cancer Father     PE: BP 130/62   Pulse 66   Ht 5' 2.5" (1.588 m) Comment: measured  Wt 162 lb (73.5 kg)   SpO2 95%   BMI 29.16 kg/m  Wt Readings from Last 3 Encounters:  11/06/18 162 lb (73.5 kg)  09/21/18 159 lb (72.1 kg)  09/18/18 158 lb (71.7 kg)   Constitutional: overweight, in NAD. Eyes: PERRLA, EOMI, no exophthalmos ENT: moist mucous membranes, no thyromegaly, no cervical lymphadenopathy Cardiovascular: RRR, No MRG Respiratory: CTA B Gastrointestinal: abdomen soft, NT, ND, BS+ Musculoskeletal: no deformities, strength intact in all 4 Skin: moist, warm, no rashes Neurological: no tremor with outstretched hands, DTR normal in all 4  Assessment: 1.  Hypercalcemia/hyperparathyroidism  2.  History of vitamin D deficiency  Plan: Patient has had several instances of elevated calcium, with the highest level being at 11. A corresponding intact PTH level was also high, at 70, for a calcium of 10.5.  - Patient also  has vitamin D deficiency,  with the last level being normal, at 30, in 09/2017.  - No apparent complications from hypercalcemia: no h/o nephrolithiasis, no osteoporosis (but does have osteopenia per DXA scan from 2014), no fractures. No abdominal pain, depression, bone pain.  However, she does have  nonspecific symptoms, as fatigue, feeling hot, weight gain, poor sleep, shortness of breath generalized muscle and joint aches. - I discussed with the patient about the physiology of calcium and parathyroid hormone, and possible side effects from increased PTH, including kidney stones, osteoporosis, abdominal pain, etc.  - We discussed that we normally initially need to check whether her hyperparathyroidism is primary (Familial hypercalcemic hypocalciuria or parathyroid adenoma) or secondary (to conditions like: vitamin D deficiency, calcium malabsorption, hypercalciuria, renal insufficiency, etc.).  If the investigation points towards primary hyperparathyroidism (parathyroid adenoma), then a parathyroid scan will be necessary.  However, she already had this and this points towards a right inferior parathyroid adenoma. - We discussed possible consequences of hyperparathyroidism: ~1/3 pts will develop complications over 15 years (OP, nephrolithiasis).  -Definitive treatment is parathyroid surgery - Criteria for parathyroid surgery are:  . Increased calcium by more than 1 mg/dL above the upper limit of normal  . Kidney ds.  . Osteoporosis (or vertebral fracture) . Age <44 years old Newer criteria (2013): Marland Kitchen High UCa >400 mg/d and increased stone risk by biochemical stone risk analysis . Presence of nephrolithiasis or nephrocalcinosis . Pt's  preference!  - she agrees with a referral to surgery.  - However, for now, I would like to complete her parathyroid investigation before referral to surgery: calcium level intact PTH ([facility]) Magnesium Phosphorus vitamin D- 25 HO and 1,25 HO 24h urinary calcium/creatinine ratio - given instructions for urine collection - We may need to recheck a DXA scan to see if she developed osteoporosis (+ add a 33% distal radius for evaluation of cortical bone, which is predominantly affected by hyperparathyroidism).  - I will advise her about vitamin D supplement dose when the results of the vitamin D level are back. - I will see the patient back in 4 months  2.  History of vitamin D deficiency -Reviewed her previous vitamin D levels along with the patient.  Last level was at the lower limit of normal 1.5 years ago.   -She is not on any supplementation.  We discussed that she may need to start. -We will check vitamin D level today.  Office Visit on 11/06/2018  Component Date Value Ref Range Status  . VITD 11/06/2018 32.26  30.00 - 100.00 ng/mL Final   Msg sent: Her vitamin D is normal but on the lower end of normal.  I would suggest to start 1000 units of vitamin D daily.for now, we can go ahead with a urine collection.  When she brings the urine in, I would like her to stop at the lab so we can check the rest of the calcium test. I will order these.  Orders Placed This Encounter  Procedures  . Calcium, urine, 24 hour  . Creatinine, urine, 24 hour  . VITAMIN D 25 Hydroxy (Vit-D Deficiency, Fractures)  . Vitamin D 1,25 dihydroxy  . Phosphorus  . Parathyroid hormone, intact (no Ca)  . Magnesium  . BASIC METABOLIC PANEL WITH GFR   Component     Latest Ref Rng & Units 11/15/2018  Creatinine, 24H Ur     0.50 - 2.15 g/24 h 0.94  Calcium, 24H Urine     mg/24 h 259 (H)  High 24h Urine calcium.  Component     Latest Ref Rng & Units 11/20/2018  Glucose     65 - 99 mg/dL 136 (H)  BUN     7  - 25 mg/dL 20  Creatinine     0.60 -  0.93 mg/dL 1.07 (H)  GFR, Est Non African American     > OR = 60 mL/min/1.14m 51 (L)  GFR, Est African American     > OR = 60 mL/min/1.731m59 (L)  BUN/Creatinine Ratio     6 - 22 (calc) 19  Sodium     135 - 146 mmol/L 141  Potassium     3.5 - 5.3 mmol/L 4.4  Chloride     98 - 110 mmol/L 103  CO2     20 - 32 mmol/L 27  Calcium     8.6 - 10.4 mg/dL 10.9 (H)  Vitamin D 1, 25 (OH) Total     18 - 72 pg/mL 73 (H)  Vitamin D3 1, 25 (OH)     pg/mL 73  Vitamin D2 1, 25 (OH)     pg/mL <8  PTH, Intact     15 - 65 pg/mL 37  Magnesium     1.5 - 2.5 mg/dL 2.1  Phosphorus     2.3 - 4.6 mg/dL 3.4   Calcium high with a nonsuppressed PTH, an elevated calcitriol level, normal magnesium and phosphorus, slightly decreased kidney function.  My suspicion is for primary hyperparathyroidism >> will refer her to see Dr. GeHarlow Asa CrPhilemon KingdomMD PhD [facility]

## 2018-11-06 NOTE — Patient Instructions (Addendum)
Please stop at the lab.  If the vitamin D is normal, we will have you back for labs and we also need a 24h urine collection.  Patient information (Up-to-Date): Collection of a 24-hour urine specimen  - You should collect every drop of urine during each 24-hour period. It does not matter how much or little urine is passed each time, as long as every drop is collected. - Begin the urine collection in the morning after you wake up, after you have emptied your bladder for the first time. - Urinate (empty the bladder) for the first time and flush it down the toilet. Note the exact time (eg, 6:15 AM). You will begin the urine collection at this time. - Collect every drop of urine during the day and night in an empty collection bottle. Store the bottle at room temperature or in the refrigerator. - If you need to have a bowel movement, any urine passed with the bowel movement should be collected. Try not to include feces with the urine collection. If feces does get mixed in, do not try to remove the feces from the urine collection bottle. - Finish by collecting the first urine passed the next morning, adding it to the collection bottle. This should be within ten minutes before or after the time of the first morning void on the first day (which was flushed). In this example, you would try to void between 6:05 and 6:25 on the second day. - If you need to urinate one hour before the final collection time, drink a full glass of water so that you can void again at the appropriate time. If you have to urinate 20 minutes before, try to hold the urine until the proper time. - Please note the exact time of the final collection, even if it is not the same time as when collection began on day 1. - The bottle(s) may be kept at room temperature for a day or two, but should be kept cool or refrigerated for longer periods of time.  Please come back for a follow-up appointment in 4 months.  Hypercalcemia Hypercalcemia is  having too much calcium in the blood. The body needs calcium to make bones and keep them strong. Calcium also helps the muscles, nerves, brain, and heart work the way they should. Most of the calcium in the body is in the bones. There is also some calcium in the blood. Hypercalcemia can happen when calcium comes out of the bones, or when the kidneys are not able to remove calcium from the blood. Hypercalcemia can be mild or severe. What are the causes? There are many possible causes of hypercalcemia. Common causes include:  Hyperparathyroidism. This is a condition in which the body produces too much parathyroid hormone. There are four parathyroid glands in your neck. These glands produce a chemical messenger (hormone) that helps the body absorb calcium from foods and helps your bones release calcium.  Certain kinds of cancer, such as lung cancer, breast cancer, or myeloma. Less common causes of hypercalcemia include:  Getting too much calcium or vitamin D from your diet.  Kidney failure.  Hyperthyroidism.  Being on bed rest for a long time.  Certain medicines.  Infections.  Sarcoidosis. What increases the risk? This condition is more likely to develop in:  Women.  People who are 60 years or older.  People who have a family history of hypercalcemia. What are the signs or symptoms? Mild hypercalcemia that starts slowly may not cause symptoms. Severe,  sudden hypercalcemia is more likely to cause symptoms, such as:  Loss of appetite.  Increased thirst and frequent urination.  Fatigue.  Nausea and vomiting.  Headache.  Abdominal pain.  Muscle pain, twitching, or weakness.  Constipation.  Blood in the urine.  Pain in the side of the back (flank pain).  Anxiety, confusion, or depression.  Irregular heartbeat (arrhythmia).  Loss of consciousness. How is this diagnosed? This condition may be diagnosed based on:  Your symptoms.  Blood tests.  Urine  tests.  X-rays.  Ultrasound.  MRI.  CT scan. How is this treated? Treatment for hypercalcemia depends on the cause. Treatment may include:  Receiving fluids through an IV tube.  Medicines that keep calcium levels steady after receiving fluids (loop diuretics).  Medicines that keep calcium in your bones (bisphosphonates).  Medicines that lower the calcium level in your blood.  Surgery to remove overactive parathyroid glands. Follow these instructions at home:  Take over-the-counter and prescription medicines only as told by your health care provider.  Follow instructions from your health care provider about eating or drinking restrictions.  Drink enough fluid to keep your urine clear or pale yellow.  Stay active. Weight-bearing exercise helps to keep calcium in your bones. Follow instructions from your health care provider about what type and level of exercise is safe for you.  Keep all follow-up visits as told by your health care provider. This is important. Contact a health care provider if:  You have a fever.  You have flank or abdominal pain that is getting worse. Get help right away if:  You have severe abdominal or flank pain.  You have chest pain.  You have trouble breathing.  You become very confused and sleepy.  You lose consciousness. This information is not intended to replace advice given to you by your health care provider. Make sure you discuss any questions you have with your health care provider. Document Released: 12/03/2004 Document Revised: 02/25/2016 Document Reviewed: 02/04/2015 Elsevier Interactive Patient Education  2019 [place].   Parathyroidectomy  A parathyroidectomy is a surgery to remove one or more parathyroid glands. These glands are in the neck. Each gland is very small, about the size of a pea. Most people have four parathyroid glands. The glands produce parathyroid hormone, which helps to control the level of calcium in the  body. You may have a parathyroidectomy if your body produces too much parathyroid hormone (hyperparathyroidism). This usually occurs when one or more of your parathyroid glands becomes enlarged from a type of noncancerous tumor (adenoma). Tell a health care provider about:  Any allergies you have.  All medicines you are taking, including vitamins, herbs, eye drops, creams, and over-the-counter medicines.  Any problems you or family members have had with anesthetic medicines.  Any blood disorders you have.  Any surgeries you have had.  Any medical conditions you have.  Whether you are pregnant or may be pregnant. What are the risks? Generally, this is a safe procedure. However, problems may occur, including:  Bleeding.  Infection.  Allergic reactions to medicines.  Damage to the nerves of your voice box (larynx). This can be temporary or long-term (rare).  Damage to nearby structures and organs, such as the skin (scarring), surrounding blood vessels, and nerves in the neck.  Hoarseness. This usually resolves in 24-48 hours.  A condition in which your body does not make enough parathyroid hormone (hypoparathyroidism). This is rare.  Difficulty breathing. This is rare. What happens before the procedure? Staying  hydrated Follow instructions from your health care provider about hydration, which may include:  Up to 2 hours before the procedure - you may continue to drink clear liquids, such as water, clear fruit juice, black coffee, and plain tea. Eating and drinking restrictions Follow instructions from your health care provider about eating and drinking, which may include:  8 hours before the procedure - stop eating heavy meals or foods such as meat, fried foods, or fatty foods.  6 hours before the procedure - stop eating light meals or foods, such as toast or cereal.  6 hours before the procedure - stop drinking milk or drinks that contain milk.  2 hours before the  procedure - stop drinking clear liquids. Medicines Ask your health care provider about:  Changing or stopping your regular medicines. This is especially important if you are taking diabetes medicines or blood thinners.  Taking medicines such as aspirin and ibuprofen. These medicines can thin your blood. Do not take these medicines unless your health care provider tells you to take them.  Taking over-the-counter medicines, vitamins, herbs, and supplements. General instructions  You may be asked to shower with a germ-killing soap.  Plan to have someone take you home from the hospital or clinic.  Plan to have a responsible adult care for you for at least 24 hours after you leave the hospital or clinic. This is important. What happens during the procedure?  To lower your risk of infection: ? Your health care team will wash or sanitize their hands. ? Hair may be removed from the surgical area. ? Your skin will be washed with soap.  An IV will be inserted into one of your veins.  You will be given one or more of the following: ? A medicine to help you relax (sedative). ? A medicine to make you fall asleep (general anesthetic).  An incision will be made according to the type of parathyroidectomy procedure you are having. There are four methods that may be used: ? Open surgery. A single incision will be made in the center of your neck. The incision will be about 2-4 inches long. ? Minimally invasive surgery. A small incision will be made in the side of your neck. This incision will be about 1-2 inches long. Before the procedure, you might be given an injection of a type of medicine that will help the surgeon to locate the gland. ? Video-assisted surgery. Two small incisions will be made in your neck. One incision is for the instruments that will be used to remove the gland. The other incision is for a tiny camera that will help the surgeon to see inside your neck. ? Endoscopic surgery. An  incision will be made just above your collarbone. A small, flexible tube (endoscope) will be inserted through this incision.  Your health care provider may monitor laryngeal nerve function during the procedure for safety reasons.  The gland or glands that are causing problems will be removed.  The incisions will be closed using stitches (sutures) or other methods. The sutures will often be hidden under the skin. The procedure may vary among health care providers and hospitals. What happens after the procedure?  Your blood pressure, heart rate, breathing rate, and blood oxygen level will be monitored until the medicines you were given have worn off.  You will be given pain medicine as needed.  Your provider will check your ability to talk and swallow after the procedure.  You will gradually start to drink liquids  and have soft foods as tolerated.  Your blood will be tested to check the calcium level in your body.  Do not drive for 24 hours if you were given a sedative during your procedure. Summary  The parathyroid glands are located in the neck and produce parathyroid hormone, which helps to control the level of calcium in the body.  A parathyroidectomy is a surgery to remove one or more parathyroid glands.  You may have a parathyroidectomy if your body produces too much parathyroid hormone (hyperparathyroidism).  There are four surgical methods that may be used for a parathyroidectomy: open, minimally invasive, video-assisted, and endoscopic.  Generally, this is a safe procedure. However, problems may occur, including bleeding, infection, and a hoarse or weak voice. This information is not intended to replace advice given to you by your health care provider. Make sure you discuss any questions you have with your health care provider. Document Released: 12/16/2008 Document Revised: 07/25/2017 Document Reviewed: 07/25/2017 Elsevier Interactive Patient Education  2019 [employer].

## 2018-11-08 ENCOUNTER — Telehealth: Payer: Self-pay

## 2018-11-08 NOTE — Telephone Encounter (Signed)
-----   Message from Philemon Kingdom, MD sent at 11/07/2018 12:44 PM EST ----- [name], can you please call pt: Her vitamin D is normal but on the lower end of normal.  I would suggest to start 1000 units of vitamin D daily.for now, we can go ahead with a urine collection.  When she brings the urine in, I would like her to stop at the lab so we can check the rest of the calcium test. I will order these.

## 2018-11-09 ENCOUNTER — Encounter: Payer: Self-pay | Admitting: Internal Medicine

## 2018-11-09 NOTE — Telephone Encounter (Signed)
Notified patient of message from Dr. Gherghe, patient expressed understanding and agreement. No further questions.  

## 2018-11-09 NOTE — Telephone Encounter (Signed)
Patient called re: patient requests to be called at ph# [phone] to be given her lab results.

## 2018-11-09 NOTE — Telephone Encounter (Signed)
There is already an open phone note regarding this.  Closing duplicate note

## 2018-11-15 ENCOUNTER — Other Ambulatory Visit: Payer: Medicare Other

## 2018-11-15 DIAGNOSIS — E210 Primary hyperparathyroidism: Secondary | ICD-10-CM

## 2018-11-17 LAB — CALCIUM, URINE, 24 HOUR: Calcium, 24H Urine: 259 mg/24 h — ABNORMAL HIGH

## 2018-11-17 LAB — CREATININE, URINE, 24 HOUR: CREATININE 24H UR: 0.94 g/(24.h) (ref 0.50–2.15)

## 2018-11-19 ENCOUNTER — Encounter: Payer: Self-pay | Admitting: Internal Medicine

## 2018-11-19 ENCOUNTER — Telehealth: Payer: Self-pay | Admitting: Internal Medicine

## 2018-11-19 ENCOUNTER — Other Ambulatory Visit: Payer: Medicare Other

## 2018-11-19 NOTE — Telephone Encounter (Signed)
Notified patient of message from Dr. Cruzita Lederer, patient expressed understanding and agreement. No further questions.  Placed on lab schedule for tomorrow

## 2018-11-19 NOTE — Telephone Encounter (Signed)
Patient called to request Courtney Ryan call her at ph# [phone] to discuss labs

## 2018-11-19 NOTE — Telephone Encounter (Signed)
Duplicate encounter, see phone note from today.

## 2018-11-19 NOTE — Telephone Encounter (Signed)
Patient stated she received a message that she is needing to redo a lab test.  Please advise

## 2018-11-19 NOTE — Telephone Encounter (Signed)
Viewed by Nada Libman on 11/18/2018 11:38 AM  Written by Philemon Kingdom, MD on 11/17/2018 5:35 PM  Dear Ms. Gatchell,  The urine calcium is high.  I did not see the rest of the labs - did you stop to the lab when you brought the urine collection for the rest of the bloodwork? If not, please call our main office number [phone]) to schedule a lab appointment.  Sincerely,  Philemon Kingdom MD

## 2018-11-20 ENCOUNTER — Other Ambulatory Visit (INDEPENDENT_AMBULATORY_CARE_PROVIDER_SITE_OTHER): Payer: Medicare Other

## 2018-11-20 DIAGNOSIS — E210 Primary hyperparathyroidism: Secondary | ICD-10-CM | POA: Diagnosis not present

## 2018-11-20 LAB — PHOSPHORUS: Phosphorus: 3.4 mg/dL (ref 2.3–4.6)

## 2018-11-20 LAB — MAGNESIUM: Magnesium: 2.1 mg/dL (ref 1.5–2.5)

## 2018-11-21 LAB — PARATHYROID HORMONE, INTACT (NO CA): PTH: 37 pg/mL (ref 15–65)

## 2018-11-23 ENCOUNTER — Telehealth: Payer: Self-pay | Admitting: Internal Medicine

## 2018-11-23 ENCOUNTER — Other Ambulatory Visit: Payer: Self-pay | Admitting: Internal Medicine

## 2018-11-23 NOTE — Telephone Encounter (Signed)
I am still waiting for 1 test to return.

## 2018-11-23 NOTE — Telephone Encounter (Signed)
Patient requests to be called at ph# [phone] re: her lab results

## 2018-11-25 LAB — BASIC METABOLIC PANEL WITH GFR
BUN/Creatinine Ratio: 19 (calc) (ref 6–22)
BUN: 20 mg/dL (ref 7–25)
CO2: 27 mmol/L (ref 20–32)
Calcium: 10.9 mg/dL — ABNORMAL HIGH (ref 8.6–10.4)
Chloride: 103 mmol/L (ref 98–110)
Creat: 1.07 mg/dL — ABNORMAL HIGH (ref 0.60–0.93)
GFR, Est African American: 59 mL/min/{1.73_m2} — ABNORMAL LOW (ref >=60)
GFR, Est Non African American: 51 mL/min/{1.73_m2} — ABNORMAL LOW (ref >=60)
Glucose, Bld: 136 mg/dL — ABNORMAL HIGH (ref 65–99)
POTASSIUM: 4.4 mmol/L (ref 3.5–5.3)
SODIUM: 141 mmol/L (ref 135–146)

## 2018-11-25 LAB — VITAMIN D 1,25 DIHYDROXY
Vitamin D 1, 25 (OH)2 Total: 73 pg/mL — ABNORMAL HIGH (ref 18–72)
Vitamin D2 1, 25 (OH)2: <8 pg/mL
Vitamin D3 1, 25 (OH)2: 73 pg/mL

## 2018-11-26 NOTE — Addendum Note (Signed)
Addended by: Philemon Kingdom on: 11/26/2018 12:01 PM   Modules accepted: Orders

## 2018-11-27 NOTE — Telephone Encounter (Signed)
Patient notified of results via MyChart. 

## 2018-11-28 ENCOUNTER — Telehealth: Payer: Self-pay | Admitting: Internal Medicine

## 2018-11-28 NOTE — Telephone Encounter (Signed)
Patient would like a call back to dicuss her thyroid  Please advise

## 2018-11-28 NOTE — Telephone Encounter (Signed)
Called patient and reviewed below message, she expressed understanding and agreement.   Viewed by Nada Libman on 11/26/2018 12:29 PM  Written by Philemon Kingdom, MD on 11/26/2018 12:02 PM  Dear Ms. Malatesta,  Your labs are now all back and they point towards a parathyroid adenoma. Based on our discussion at last visit, I placed a referral for you to see Dr. Harlow Asa, with [facility] surgery.  His office will contact you to schedule the initial appointment.  Sincerely,  Philemon Kingdom MD

## 2018-11-29 ENCOUNTER — Other Ambulatory Visit: Payer: Self-pay | Admitting: Internal Medicine

## 2018-12-26 ENCOUNTER — Other Ambulatory Visit: Payer: Self-pay | Admitting: Surgery

## 2018-12-26 DIAGNOSIS — E210 Primary hyperparathyroidism: Secondary | ICD-10-CM

## 2019-02-12 ENCOUNTER — Ambulatory Visit: Payer: Self-pay | Admitting: Surgery

## 2019-02-12 NOTE — H&P (Signed)
General Surgery [facility]  Courtney Ryan DOB: [birth year] Married / Language: Undefined / Race: White Female  History of Present Illness  The patient is a 73 year old female who presents with primary hyperparathyroidism.  CHIEF COMPLAINT: primary hyperparathyroidism  Patient is referred by Dr. Philemon Kingdom from endocrinology for surgical evaluation and management of primary hyperparathyroidism. Patient's primary care physician is Dr. Tommie Ard Baxley. Patient was noted on routine laboratory studies to have elevated serum calcium levels. Recently these have ranged from 10.5-11.0. Additional testing included PTH level which ranged from 37-70. 24-hour urine collection for calcium was elevated at 259. Vitamin D levels were normal. Patient was seen in consultation by endocrinology and underwent nuclear medicine parathyroid scan on October 18, 2018. This demonstrated a right inferior parathyroid adenoma. No additional imaging studies were obtained. Patient has had no prior surgery on the head or neck. There is a family history of thyroid goiter and a maternal cousin. No history of other endocrine neoplasms. Patient does note fatigue. She denies nephrolithiasis. She does have a bone density scan from 2014 showing osteopenia. Patient is from the [place]. She is retired from [employer].   Allergies Penicillins  Allergies Reconciled   Medication History ALPRAZolam (0.25MG  Tablet, Oral) Active. amLODIPine Besylate (5MG  Tablet, Oral) Active. Atenolol (25MG  Tablet, Oral) Active. Furosemide (40MG  Tablet, Oral) Active. Rosuvastatin Calcium (5MG  Tablet, Oral) Active. Synthroid (75MCG Tablet, Oral) Active. levoFLOXacin (500MG  Tablet, Oral) Active. Medications Reconciled  Vitals Weight: 165 lb Height: 64in Body Surface Area: 1.8 m Body Mass Index: 28.32 kg/m  Temp.: 98.1F(Oral)  Pulse: 94 (Regular)  P.OX: 80% (Room air) BP:  110/62 (Sitting, Left Arm, Standard)  Physical Exam   See vital signs recorded above  GENERAL APPEARANCE Development: normal Nutritional status: normal Gross deformities: none  SKIN Rash, lesions, ulcers: none Induration, erythema: none Nodules: none palpable  EYES Conjunctiva and lids: normal Pupils: equal and reactive Iris: normal bilaterally  EARS, NOSE, MOUTH, THROAT External ears: no lesion or deformity External nose: no lesion or deformity Hearing: grossly normal Lips: no lesion or deformity Dentition: normal for age Oral mucosa: moist  NECK Symmetric: yes Trachea: midline Thyroid: no palpable nodules in the thyroid bed  CHEST Respiratory effort: normal Retraction or accessory muscle use: no Breath sounds: normal bilaterally Rales, rhonchi, wheeze: none  CARDIOVASCULAR Auscultation: regular rhythm, normal rate Murmurs: none Pulses: carotid and radial pulse 2+ palpable Lower extremity edema: none Lower extremity varicosities: none  MUSCULOSKELETAL Station and gait: normal Digits and nails: no clubbing or cyanosis Muscle strength: grossly normal all extremities Range of motion: grossly normal all extremities Deformity: none  LYMPHATIC Cervical: none palpable Supraclavicular: none palpable  PSYCHIATRIC Oriented to person, place, and time: yes Mood and affect: normal for situation Judgment and insight: appropriate for situation    Assessment & Plan  PRIMARY HYPERPARATHYROIDISM (E21.0)  Pt Education - Pamphlet Given - The Parathyroid Surgery Book: discussed with patient and provided information.  Patient presents today on referral from her primary care physician and endocrinologist for evaluation of primary hyperparathyroidism. Patient is provided with written literature on parathyroid surgery to review at home.  Patient has biochemical evidence of hyperparathyroidism. Nuclear medicine parathyroid scan he localized a right inferior  parathyroid adenoma. Patient does have symptoms including fatigue and osteopenia. I have recommended proceeding with minimally invasive parathyroidectomy. We have discussed the procedure. We discussed the risk and benefits including the potential for a second gland adenoma and risk of injury to recurrent laryngeal nerves.  We have discussed doing this as an outpatient surgery. We have discussed restrictions on her activities and subsequent recovery. Patient understands and wishes to proceed.  I would like to obtain an ultrasound examination of the neck to evaluate for concurrent thyroid disease. This may also confirm the location of the right inferior adenoma. We will obtain the study in the near future.  Unfortunately, given the current medical climate, elective surgery such as this procedure are postponed. We are not able to schedule her case at this time. It may be several weeks before we can place her in the operating room schedule. We will contact the patient when there is time available.  The risks and benefits of the procedure have been discussed at length with the patient. The patient understands the proposed procedure, potential alternative treatments, and the course of recovery to be expected. All of the patient's questions have been answered at this time. The patient wishes to proceed with surgery.  [name], [facility] Office: [phone]

## 2019-02-12 NOTE — H&P (View-Only) (Signed)
[facility]  Courtney Ryan DOB: 1946-09-04 Married / Language: Undefined / Race: White Female  History of Present Illness  The patient is a 73 year old female who presents with primary hyperparathyroidism.  CHIEF COMPLAINT: primary hyperparathyroidism  Patient is referred by Dr. Philemon Kingdom from endocrinology for surgical evaluation and management of primary hyperparathyroidism. Patient's primary care physician is Dr. Tommie Ard Baxley. Patient was noted on routine laboratory studies to have elevated serum calcium levels. Recently these have ranged from 10.5-11.0. Additional testing included PTH level which ranged from 37-70. 24-hour urine collection for calcium was elevated at 259. Vitamin D levels were normal. Patient was seen in consultation by endocrinology and underwent nuclear medicine parathyroid scan on October 18, 2018. This demonstrated a right inferior parathyroid adenoma. No additional imaging studies were obtained. Patient has had no prior surgery on the head or neck. There is a family history of thyroid goiter and a maternal cousin. No history of other endocrine neoplasms. Patient does note fatigue. She denies nephrolithiasis. She does have a bone density scan from 2014 showing osteopenia. Patient is from the [place]. She is retired from [employer].   Allergies Penicillins  Allergies Reconciled   Medication History ALPRAZolam (0.25MG  Tablet, Oral) Active. amLODIPine Besylate (5MG  Tablet, Oral) Active. Atenolol (25MG  Tablet, Oral) Active. Furosemide (40MG  Tablet, Oral) Active. Rosuvastatin Calcium (5MG  Tablet, Oral) Active. Synthroid (75MCG Tablet, Oral) Active. levoFLOXacin (500MG  Tablet, Oral) Active. Medications Reconciled  Vitals Weight: 165 lb Height: 64in Body Surface Area: 1.8 m Body Mass Index: 28.32 kg/m  Temp.: 98.20F(Oral)  Pulse: 94 (Regular)  P.OX: 80% (Room air) BP:  110/62 (Sitting, Left Arm, Standard)  Physical Exam   See vital signs recorded above  GENERAL APPEARANCE Development: normal Nutritional status: normal Gross deformities: none  SKIN Rash, lesions, ulcers: none Induration, erythema: none Nodules: none palpable  EYES Conjunctiva and lids: normal Pupils: equal and reactive Iris: normal bilaterally  EARS, NOSE, MOUTH, THROAT External ears: no lesion or deformity External nose: no lesion or deformity Hearing: grossly normal Lips: no lesion or deformity Dentition: normal for age Oral mucosa: moist  NECK Symmetric: yes Trachea: midline Thyroid: no palpable nodules in the thyroid bed  CHEST Respiratory effort: normal Retraction or accessory muscle use: no Breath sounds: normal bilaterally Rales, rhonchi, wheeze: none  CARDIOVASCULAR Auscultation: regular rhythm, normal rate Murmurs: none Pulses: carotid and radial pulse 2+ palpable Lower extremity edema: none Lower extremity varicosities: none  MUSCULOSKELETAL Station and gait: normal Digits and nails: no clubbing or cyanosis Muscle strength: grossly normal all extremities Range of motion: grossly normal all extremities Deformity: none  LYMPHATIC Cervical: none palpable Supraclavicular: none palpable  PSYCHIATRIC Oriented to person, place, and time: yes Mood and affect: normal for situation Judgment and insight: appropriate for situation    Assessment & Plan  PRIMARY HYPERPARATHYROIDISM (E21.0)  Pt Education - Pamphlet Given - The Parathyroid Surgery Book: discussed with patient and provided information.  Patient presents today on referral from her primary care physician and endocrinologist for evaluation of primary hyperparathyroidism. Patient is provided with written literature on parathyroid surgery to review at home.  Patient has biochemical evidence of hyperparathyroidism. Nuclear medicine parathyroid scan he localized a right inferior  parathyroid adenoma. Patient does have symptoms including fatigue and osteopenia. I have recommended proceeding with minimally invasive parathyroidectomy. We have discussed the procedure. We discussed the risk and benefits including the potential for a second gland adenoma and risk of injury to recurrent laryngeal nerves.  We have discussed doing this as an outpatient surgery. We have discussed restrictions on her activities and subsequent recovery. Patient understands and wishes to proceed.  I would like to obtain an ultrasound examination of the neck to evaluate for concurrent thyroid disease. This may also confirm the location of the right inferior adenoma. We will obtain the study in the near future.  Unfortunately, given the current medical climate, elective surgery such as this procedure are postponed. We are not able to schedule her case at this time. It may be several weeks before we can place her in the operating room schedule. We will contact the patient when there is time available.  The risks and benefits of the procedure have been discussed at length with the patient. The patient understands the proposed procedure, potential alternative treatments, and the course of recovery to be expected. All of the patient's questions have been answered at this time. The patient wishes to proceed with surgery.  Courtney Ryan, [facility] Office: [phone]

## 2019-02-18 ENCOUNTER — Other Ambulatory Visit: Payer: Medicare Other

## 2019-02-22 NOTE — Patient Instructions (Signed)
Courtney Ryan    Your procedure is scheduled on: 03/07/19   Report to [facility] Main  Entrance  Report to admitting at 8:15 AM   YOU NEED TO HAVE A COVID 19 TEST ON Monday at 6/1/20_______, THIS TEST MUST BE DONE BEFORE SURGERY, COME TO [place] ENTRANCE BETWEEN THE HOURS OF 900 AM AND 300 PM ON YOUR COVID TEST DATE.   Call this number if you have problems the morning of surgery [phone]    Remember: Do not eat food or drink liquids :After Midnight.            BRUSH YOUR TEETH MORNING OF SURGERY AND RINSE YOUR MOUTH OUT, NO CHEWING GUM CANDY OR MINTS.     Take these medicines the morning of surgery with A SIP OF WATER: Xanax,claritin,Atenolol (tenormin) amlodipine(Norvasc) Synthroid  DO NOT TAKE ANY DIABETIC MEDICATIONS DAY OF YOUR SURGERY       [place] - Preparing for Surgery Before surgery, you can play an important role.  Because skin is not sterile, your skin needs to be as free of germs as possible.  You can reduce the number of germs on your skin by washing with CHG (chlorahexidine gluconate) soap before surgery.  CHG is an antiseptic cleaner which kills germs and bonds with the skin to continue killing germs even after washing. Please DO NOT use if you have an allergy to CHG or antibacterial soaps.  If your skin becomes reddened/irritated stop using the CHG and inform your nurse when you arrive at Short Stay. Do not shave (including legs and underarms) for at least 48 hours prior to the first CHG shower.  You may shave your face/neck . Please follow these instructions carefully:  1.  Shower with CHG Soap the night before surgery and the  morning of Surgery.  2.  If you choose to wash your hair, wash your hair first as usual with your  normal  shampoo.  3.  After you shampoo, rinse your hair and body thoroughly to remove the  shampoo.                                      4.  Use CHG as you would any other liquid soap.   You can apply chg directly  to the skin and wash                       Gently with a scrungie or clean washcloth.  5.  Apply the CHG Soap to your body ONLY FROM THE NECK DOWN.   Do not use on face/ open                           Wound or open sores. Avoid contact with eyes, ears mouth and genitals (private parts).                       Wash face,  Genitals (private parts) with your normal soap.             6.  Wash thoroughly, paying special attention to the area where your surgery  will be performed.  7.  Thoroughly rinse your body with warm water from the neck down.  8.  DO NOT shower/wash with your normal soap after using and rinsing off  the CHG Soap.                9.  Pat yourself dry with a clean towel.            10.  Wear clean pajamas.            11.  Place clean sheets on your bed the night of your first shower and do not  sleep with pets . Day of Surgery : Do not apply any lotions/deodorants the morning of surgery.  Please wear clean clothes to the hospital/surgery center.   FAILURE TO FOLLOW THESE INSTRUCTIONS MAY RESULT IN THE CANCELLATION OF YOUR SURGERY PATIENT SIGNATURE_________________________________  NURSE SIGNATURE__________________________________  ________________________________________________________________________                         Dennis Bast may not have any metal on your body including hair pins and              piercings           Do not wear jewelry, make-up, lotions, powders or perfumes, deodorant             Do not wear nail polish.  Do not shave  48 hours prior to surgery.                 Do not bring valuables to the hospital. [place].  Contacts, dentures or bridgewom.ork may not be worn into surgery.       Name and phone number of your driver:   Special Instructions: N/A              Please read over the following fact sheets you were  given: _____________________________________________________________________

## 2019-02-26 ENCOUNTER — Encounter (HOSPITAL_COMMUNITY): Payer: Self-pay

## 2019-02-26 ENCOUNTER — Other Ambulatory Visit: Payer: Self-pay

## 2019-02-26 ENCOUNTER — Encounter (HOSPITAL_COMMUNITY)
Admission: RE | Admit: 2019-02-26 | Discharge: 2019-02-26 | Disposition: A | Discharge status: Home / Self Care | Payer: Medicare Other | Source: Ambulatory Visit | Attending: Surgery | Admitting: Surgery

## 2019-02-26 DIAGNOSIS — Z01818 Encounter for other preprocedural examination: Secondary | ICD-10-CM | POA: Insufficient documentation

## 2019-02-26 DIAGNOSIS — E210 Primary hyperparathyroidism: Secondary | ICD-10-CM | POA: Diagnosis not present

## 2019-02-26 DIAGNOSIS — I10 Essential (primary) hypertension: Secondary | ICD-10-CM | POA: Diagnosis not present

## 2019-02-26 DIAGNOSIS — R9431 Abnormal electrocardiogram [ECG] [EKG]: Secondary | ICD-10-CM | POA: Insufficient documentation

## 2019-02-26 HISTORY — DX: Other bursitis of hip, left hip: M70.72

## 2019-02-26 HISTORY — DX: Other bursitis of knee, right knee: M70.51

## 2019-02-26 HISTORY — DX: Anxiety disorder, unspecified: F41.9

## 2019-02-26 HISTORY — DX: Polyp of nasal cavity: J33.0

## 2019-02-26 HISTORY — DX: Chronic obstructive pulmonary disease, unspecified: J44.9

## 2019-02-26 HISTORY — DX: Depression, unspecified: F32.A

## 2019-02-26 LAB — BASIC METABOLIC PANEL
Anion gap: 8 (ref 5–15)
BUN: 26 mg/dL — ABNORMAL HIGH (ref 8–23)
CO2: 27 mmol/L (ref 22–32)
Calcium: 10.2 mg/dL (ref 8.9–10.3)
Chloride: 105 mmol/L (ref 98–111)
Creatinine, Ser: 1.05 mg/dL — ABNORMAL HIGH (ref 0.44–1.00)
GFR calc Af Amer: >60 mL/min (ref >60)
GFR calc non Af Amer: 52 mL/min — ABNORMAL LOW (ref >60)
Glucose, Bld: 104 mg/dL — ABNORMAL HIGH (ref 70–99)
Potassium: 4.3 mmol/L (ref 3.5–5.1)
Sodium: 140 mmol/L (ref 135–145)

## 2019-02-26 LAB — CBC
HCT: 45.1 % (ref 36.0–46.0)
Hemoglobin: 14.1 g/dL (ref 12.0–15.0)
MCH: 29 pg (ref 26.0–34.0)
MCHC: 31.3 g/dL (ref 30.0–36.0)
MCV: 92.6 fL (ref 80.0–100.0)
Platelets: 287 10*3/uL (ref 150–400)
RBC: 4.87 MIL/uL (ref 3.87–5.11)
RDW: 14.4 % (ref 11.5–15.5)
WBC: 11 10*3/uL — ABNORMAL HIGH (ref 4.0–10.5)
nRBC: 0 % (ref 0.0–0.2)

## 2019-02-26 NOTE — Progress Notes (Signed)
[name] PA Pt reports that she doesn't have diabetes but she had a history of testind CBG at home. She said "oh, I don't bother with all that stuff any more " I ordered a HGBA1C which is pending PCP is Dr. Renold Genta.

## 2019-02-27 ENCOUNTER — Ambulatory Visit
Admission: RE | Admit: 2019-02-27 | Discharge: 2019-02-27 | Disposition: A | Discharge status: Home / Self Care | Payer: Medicare Other | Source: Ambulatory Visit | Attending: Surgery | Admitting: Surgery

## 2019-02-27 DIAGNOSIS — E210 Primary hyperparathyroidism: Secondary | ICD-10-CM

## 2019-02-27 LAB — HEMOGLOBIN A1C
Hgb A1c MFr Bld: 6.5 % — ABNORMAL HIGH (ref 4.8–5.6)
Mean Plasma Glucose: 140 mg/dL

## 2019-03-04 ENCOUNTER — Other Ambulatory Visit: Payer: Medicare Other

## 2019-03-04 ENCOUNTER — Other Ambulatory Visit (HOSPITAL_COMMUNITY)
Admission: RE | Admit: 2019-03-04 | Discharge: 2019-03-04 | Disposition: A | Discharge status: Home / Self Care | Payer: Medicare Other | Source: Ambulatory Visit | Attending: Surgery | Admitting: Surgery

## 2019-03-04 DIAGNOSIS — Z1159 Encounter for screening for other viral diseases: Secondary | ICD-10-CM | POA: Insufficient documentation

## 2019-03-06 LAB — NOVEL CORONAVIRUS, NAA (HOSP ORDER, SEND-OUT TO REF LAB; TAT 18-24 HRS): SARS-CoV-2, NAA: NOT DETECTED

## 2019-03-06 NOTE — Progress Notes (Signed)
SPOKE W/  PATIENT     SCREENING SYMPTOMS OF COVID 19:   COUGH--NO  RUNNY NOSE---NO   SORE THROAT---NO  NASAL CONGESTION----NO  SNEEZING----NO  SHORTNESS OF BREATH---NO  DIFFICULTY BREATHING---NO  TEMP >100.0 -----NO  UNEXPLAINED BODY ACHES------NO  CHILLS -------- NO  HEADACHES ---------NO  LOSS OF SMELL/ TASTE --------NO    HAVE YOU OR ANY FAMILY MEMBER TRAVELLED PAST 14 DAYS OUT OF THE   COUNTY---NO STATE----NO COUNTRY----NO  HAVE YOU OR ANY FAMILY MEMBER BEEN EXPOSED TO ANYONE WITH COVID 19? NO

## 2019-03-07 ENCOUNTER — Ambulatory Visit (HOSPITAL_COMMUNITY): Payer: Medicare Other | Admitting: Physician Assistant

## 2019-03-07 ENCOUNTER — Ambulatory Visit (HOSPITAL_COMMUNITY)
Admission: RE | Admit: 2019-03-07 | Discharge: 2019-03-07 | Disposition: A | Discharge status: Home / Self Care | Payer: Medicare Other | Attending: Surgery | Admitting: Surgery

## 2019-03-07 ENCOUNTER — Other Ambulatory Visit: Payer: Self-pay

## 2019-03-07 ENCOUNTER — Encounter (HOSPITAL_COMMUNITY)
Admission: RE | Disposition: A | Discharge status: Home / Self Care | Payer: Self-pay | Source: Home / Self Care | Attending: Surgery

## 2019-03-07 ENCOUNTER — Ambulatory Visit (HOSPITAL_COMMUNITY): Payer: Medicare Other | Admitting: Certified Registered"

## 2019-03-07 ENCOUNTER — Encounter (HOSPITAL_COMMUNITY): Payer: Self-pay

## 2019-03-07 DIAGNOSIS — M1990 Unspecified osteoarthritis, unspecified site: Secondary | ICD-10-CM | POA: Insufficient documentation

## 2019-03-07 DIAGNOSIS — Z79890 Hormone replacement therapy (postmenopausal): Secondary | ICD-10-CM | POA: Insufficient documentation

## 2019-03-07 DIAGNOSIS — I10 Essential (primary) hypertension: Secondary | ICD-10-CM | POA: Insufficient documentation

## 2019-03-07 DIAGNOSIS — Z79899 Other long term (current) drug therapy: Secondary | ICD-10-CM | POA: Insufficient documentation

## 2019-03-07 DIAGNOSIS — D351 Benign neoplasm of parathyroid gland: Secondary | ICD-10-CM | POA: Diagnosis not present

## 2019-03-07 DIAGNOSIS — Z87891 Personal history of nicotine dependence: Secondary | ICD-10-CM | POA: Diagnosis not present

## 2019-03-07 DIAGNOSIS — F329 Major depressive disorder, single episode, unspecified: Secondary | ICD-10-CM | POA: Diagnosis not present

## 2019-03-07 DIAGNOSIS — E210 Primary hyperparathyroidism: Secondary | ICD-10-CM | POA: Insufficient documentation

## 2019-03-07 DIAGNOSIS — M8580 Other specified disorders of bone density and structure, unspecified site: Secondary | ICD-10-CM | POA: Insufficient documentation

## 2019-03-07 DIAGNOSIS — F419 Anxiety disorder, unspecified: Secondary | ICD-10-CM | POA: Insufficient documentation

## 2019-03-07 DIAGNOSIS — E041 Nontoxic single thyroid nodule: Secondary | ICD-10-CM | POA: Diagnosis not present

## 2019-03-07 HISTORY — PX: PARATHYROIDECTOMY: SHX19

## 2019-03-07 SURGERY — PARATHYROIDECTOMY
Anesthesia: General | Site: Neck

## 2019-03-07 MED ORDER — FENTANYL CITRATE (PF) 100 MCG/2ML IJ SOLN
INTRAMUSCULAR | Status: AC
  Filled 2019-03-07: qty 2

## 2019-03-07 MED ORDER — DEXAMETHASONE SODIUM PHOSPHATE 10 MG/ML IJ SOLN
INTRAMUSCULAR | Status: DC | PRN
  Administered 2019-03-07: 10 mg via INTRAVENOUS

## 2019-03-07 MED ORDER — PHENYLEPHRINE 40 MCG/ML (10ML) SYRINGE FOR IV PUSH (FOR BLOOD PRESSURE SUPPORT)
PREFILLED_SYRINGE | INTRAVENOUS | Status: AC
  Filled 2019-03-07: qty 10

## 2019-03-07 MED ORDER — ONDANSETRON HCL 4 MG/2ML IJ SOLN
INTRAMUSCULAR | Status: DC | PRN
  Administered 2019-03-07: 4 mg via INTRAVENOUS

## 2019-03-07 MED ORDER — PHENYLEPHRINE 40 MCG/ML (10ML) SYRINGE FOR IV PUSH (FOR BLOOD PRESSURE SUPPORT)
PREFILLED_SYRINGE | INTRAVENOUS | Status: DC | PRN
  Administered 2019-03-07: 80 ug via INTRAVENOUS

## 2019-03-07 MED ORDER — BUPIVACAINE HCL (PF) 0.25 % IJ SOLN
INTRAMUSCULAR | Status: AC
  Filled 2019-03-07: qty 30

## 2019-03-07 MED ORDER — ROCURONIUM BROMIDE 10 MG/ML (PF) SYRINGE
PREFILLED_SYRINGE | INTRAVENOUS | Status: DC | PRN
  Administered 2019-03-07: 30 mg via INTRAVENOUS

## 2019-03-07 MED ORDER — 0.9 % SODIUM CHLORIDE (POUR BTL) OPTIME
TOPICAL | Status: DC | PRN
  Administered 2019-03-07: 1000 mL

## 2019-03-07 MED ORDER — CHLORHEXIDINE GLUCONATE CLOTH 2 % EX PADS: 6.0000 | MEDICATED_PAD | Freq: Once | CUTANEOUS | Status: DC

## 2019-03-07 MED ORDER — LIDOCAINE 2% (20 MG/ML) 5 ML SYRINGE
INTRAMUSCULAR | Status: AC
  Filled 2019-03-07: qty 5

## 2019-03-07 MED ORDER — SUCCINYLCHOLINE CHLORIDE 200 MG/10ML IV SOSY
PREFILLED_SYRINGE | INTRAVENOUS | Status: DC | PRN
  Administered 2019-03-07: 120 mg via INTRAVENOUS

## 2019-03-07 MED ORDER — FENTANYL CITRATE (PF) 100 MCG/2ML IJ SOLN
INTRAMUSCULAR | Status: AC
  Filled 2019-03-07: qty 4

## 2019-03-07 MED ORDER — CIPROFLOXACIN IN D5W 400 MG/200ML IV SOLN: 400.0000 mg | INTRAVENOUS | Status: DC

## 2019-03-07 MED ORDER — OXYCODONE HCL 5 MG/5ML PO SOLN: 5.0000 mg | Freq: Once | ORAL | Status: DC | PRN

## 2019-03-07 MED ORDER — FENTANYL CITRATE (PF) 250 MCG/5ML IJ SOLN
INTRAMUSCULAR | Status: DC | PRN
  Administered 2019-03-07: 50 ug via INTRAVENOUS
  Administered 2019-03-07: 100 ug via INTRAVENOUS

## 2019-03-07 MED ORDER — SUCCINYLCHOLINE CHLORIDE 200 MG/10ML IV SOSY
PREFILLED_SYRINGE | INTRAVENOUS | Status: AC
  Filled 2019-03-07: qty 10

## 2019-03-07 MED ORDER — PROPOFOL 10 MG/ML IV BOLUS
INTRAVENOUS | Status: DC | PRN
  Administered 2019-03-07: 130 mg via INTRAVENOUS

## 2019-03-07 MED ORDER — ACETAMINOPHEN 160 MG/5ML PO SOLN: 1000.0000 mg | Freq: Once | ORAL | Status: DC | PRN

## 2019-03-07 MED ORDER — FENTANYL CITRATE (PF) 100 MCG/2ML IJ SOLN
25.0000 ug | INTRAMUSCULAR | Status: DC | PRN
  Administered 2019-03-07 (×2): 50 ug via INTRAVENOUS

## 2019-03-07 MED ORDER — SUGAMMADEX SODIUM 200 MG/2ML IV SOLN
INTRAVENOUS | Status: DC | PRN
  Administered 2019-03-07: 150 mg via INTRAVENOUS

## 2019-03-07 MED ORDER — ACETAMINOPHEN 500 MG PO TABS: 1000.0000 mg | ORAL_TABLET | Freq: Once | ORAL | Status: DC | PRN

## 2019-03-07 MED ORDER — ACETAMINOPHEN 10 MG/ML IV SOLN: 1000.0000 mg | Freq: Once | INTRAVENOUS | Status: DC | PRN

## 2019-03-07 MED ORDER — ROCURONIUM BROMIDE 10 MG/ML (PF) SYRINGE
PREFILLED_SYRINGE | INTRAVENOUS | Status: AC
  Filled 2019-03-07: qty 10

## 2019-03-07 MED ORDER — OXYCODONE HCL 5 MG PO TABS: 5.0000 mg | ORAL_TABLET | Freq: Once | ORAL | Status: DC | PRN

## 2019-03-07 MED ORDER — BUPIVACAINE HCL 0.25 % IJ SOLN
INTRAMUSCULAR | Status: DC | PRN
  Administered 2019-03-07: 10 mL

## 2019-03-07 MED ORDER — EPHEDRINE 5 MG/ML INJ
INTRAVENOUS | Status: AC
  Filled 2019-03-07: qty 10

## 2019-03-07 MED ORDER — PROPOFOL 10 MG/ML IV BOLUS
INTRAVENOUS | Status: AC
  Filled 2019-03-07: qty 20

## 2019-03-07 MED ORDER — CIPROFLOXACIN IN D5W 400 MG/200ML IV SOLN
INTRAVENOUS | Status: AC
  Filled 2019-03-07: qty 200

## 2019-03-07 MED ORDER — ONDANSETRON HCL 4 MG/2ML IJ SOLN
INTRAMUSCULAR | Status: AC
  Filled 2019-03-07: qty 2

## 2019-03-07 MED ORDER — TRAMADOL HCL 50 MG PO TABS: 50.0000 mg | ORAL_TABLET | Freq: Four times a day (QID) | ORAL | 0 refills | Status: DC | PRN

## 2019-03-07 MED ORDER — DEXAMETHASONE SODIUM PHOSPHATE 10 MG/ML IJ SOLN
INTRAMUSCULAR | Status: AC
  Filled 2019-03-07: qty 1

## 2019-03-07 MED ORDER — SUGAMMADEX SODIUM 200 MG/2ML IV SOLN
INTRAVENOUS | Status: AC
  Filled 2019-03-07: qty 2

## 2019-03-07 MED ORDER — LACTATED RINGERS IV SOLN
INTRAVENOUS | Status: DC
  Administered 2019-03-07: 09:00:00 via INTRAVENOUS

## 2019-03-07 SURGICAL SUPPLY — 33 items
ATTRACTOMAT 16X20 MAGNETIC DRP (DRAPES) ×2 IMPLANT
BLADE SURG 15 STRL LF DISP TIS (BLADE) ×1 IMPLANT
BLADE SURG 15 STRL SS (BLADE) ×1
CHLORAPREP W/TINT 26 (MISCELLANEOUS) ×2 IMPLANT
CLIP VESOCCLUDE MED 6/CT (CLIP) ×4 IMPLANT
CLIP VESOCCLUDE SM WIDE 6/CT (CLIP) ×4 IMPLANT
COVER SURGICAL LIGHT HANDLE (MISCELLANEOUS) ×2 IMPLANT
COVER WAND RF STERILE (DRAPES) ×2 IMPLANT
DERMABOND ADVANCED (GAUZE/BANDAGES/DRESSINGS) ×1
DERMABOND ADVANCED .7 DNX12 (GAUZE/BANDAGES/DRESSINGS) ×1 IMPLANT
DRAPE LAPAROTOMY T 98X78 PEDS (DRAPES) ×2 IMPLANT
ELECT PENCIL ROCKER SW 15FT (MISCELLANEOUS) ×2 IMPLANT
ELECT REM PT RETURN 15FT ADLT (MISCELLANEOUS) ×2 IMPLANT
GAUZE 4X4 16PLY RFD (DISPOSABLE) ×2 IMPLANT
GLOVE BIOGEL PI IND STRL 7.5 (GLOVE) ×2 IMPLANT
GLOVE BIOGEL PI INDICATOR 7.5 (GLOVE) ×2
GLOVE SURG ORTHO 8.0 STRL STRW (GLOVE) ×2 IMPLANT
GLOVE SURG SS PI 7.0 STRL IVOR (GLOVE) ×2 IMPLANT
GLOVE SURG SS PI 7.5 STRL IVOR (GLOVE) ×2 IMPLANT
GOWN STRL REUS W/TWL XL LVL3 (GOWN DISPOSABLE) ×6 IMPLANT
HEMOSTAT SURGICEL 2X4 FIBR (HEMOSTASIS) ×2 IMPLANT
ILLUMINATOR WAVEGUIDE N/F (MISCELLANEOUS) IMPLANT
KIT BASIN OR (CUSTOM PROCEDURE TRAY) ×2 IMPLANT
KIT TURNOVER KIT A (KITS) ×2 IMPLANT
NEEDLE HYPO 25X1 1.5 SAFETY (NEEDLE) ×2 IMPLANT
PACK BASIC VI WITH GOWN DISP (CUSTOM PROCEDURE TRAY) ×2 IMPLANT
SUT MNCRL AB 4-0 PS2 18 (SUTURE) ×2 IMPLANT
SUT VIC AB 3-0 SH 18 (SUTURE) ×2 IMPLANT
SYR BULB IRRIGATION 50ML (SYRINGE) ×2 IMPLANT
SYR CONTROL 10ML LL (SYRINGE) ×2 IMPLANT
TOWEL OR 17X26 10 PK STRL BLUE (TOWEL DISPOSABLE) ×2 IMPLANT
TOWEL OR NON WOVEN STRL DISP B (DISPOSABLE) ×2 IMPLANT
TUBING CONNECTING 10 (TUBING) ×2 IMPLANT

## 2019-03-07 NOTE — Op Note (Signed)
OPERATIVE REPORT - PARATHYROIDECTOMY  Preoperative diagnosis: Primary hyperparathyroidism  Postop diagnosis: Primary hyperparathyroidism, right thyroid nodule  Procedure: Right inferior minimally invasive parathyroidectomy, right partial thyroid lobectomy  Surgeon:  [name], MD  Anesthesia: General endotracheal  Estimated blood loss: Minimal  Preparation: ChloraPrep  Indications: Patient is referred by Dr. Philemon Kingdom from endocrinology for surgical evaluation and management of primary hyperparathyroidism. Patient's primary care physician is Dr. Tommie Ard Baxley. Patient was noted on routine laboratory studies to have elevated serum calcium levels. Recently these have ranged from 10.5-11.0. Additional testing included PTH level which ranged from 37-70. 24-hour urine collection for calcium was elevated at 259. Vitamin D levels were normal. Patient was seen in consultation by endocrinology and underwent nuclear medicine parathyroid scan on October 18, 2018. This demonstrated a right inferior parathyroid adenoma. No additional imaging studies were obtained. Patient has had no prior surgery on the head or neck. There is a family history of thyroid goiter and a maternal cousin. No history of other endocrine neoplasms. Patient does note fatigue. She denies nephrolithiasis. She does have a bone density scan from 2014 showing osteopenia.   Procedure: The patient was prepared in the pre-operative holding area. The patient was brought to the operating room and placed in a supine position on the operating room table. Following administration of general anesthesia, the patient was positioned and then prepped and draped in the usual strict aseptic fashion. After ascertaining that an adequate level of anesthesia been achieved, a neck incision was made with a #15 blade. Dissection was carried through subcutaneous tissues and platysma. Hemostasis was obtained with the electrocautery. Skin  flaps were developed circumferentially and a Weitlander retractor was placed for exposure.  Strap muscles were incised in the midline. Strap muscles were reflected lateralley exposing the right thyroid lobe. With gentle blunt dissection the thyroid lobe was mobilized.  There was a dominant mass at the inferior and posterior aspect of the right thyroid lobe.  This was gently mobilized.  On the surface of the inferior pole was a mildly enlarged parathyroid gland.  This was somewhat rounded but less than 1 cm in greatest dimension.  It was excised and submitted to pathology where frozen section confirmed parathyroid tissue, possibly consistent with adenoma.  Intraoperative review of the sestamibi scan however demonstrates a radioactive signal which is more consistent with the large mass in the inferior and posterior portion of the right thyroid lobe.  A decision was made to excise this mass.  Dissection was easily accomplished as the mass seemed to separate from the adjacent thyroid tissue.  It was completely dissected out and resected from the remaining right thyroid lobe using the electrocautery for hemostasis.  This mass measured greater than 2 cm in size.  It was submitted to pathology for frozen section which confirmed thyroid tissue.  Based on the diagnostic studies including the sestamibi scan and the ultrasound examination, a decision was made not to perform a full neck exploration at this time.  Neck was irrigated with warm saline.  Good hemostasis was noted.  Fibrillar was placed throughout the operative field.  Strap muscles were approximated in the midline with interrupted 3-0 Vicryl sutures. Platysma was closed with interrupted 3-0 Vicryl sutures. Marcaine was infiltrated circumferentially. Skin was closed with a running 4-0 Monocryl subcuticular suture. Wound was washed and dried and Dermabond was applied. Patient was awakened from anesthesia and brought to the recovery room. The patient tolerated  the procedure well.   [name], MD [facility] Office: [phone]

## 2019-03-07 NOTE — Interval H&P Note (Signed)
History and Physical Interval Note:  03/07/2019 10:13 AM  Courtney Ryan  has presented today for surgery, with the diagnosis of PRIMARY HYPERPARATHYROIDISM.  The various methods of treatment have been discussed with the patient and family. After consideration of risks, benefits and other options for treatment, the patient has consented to    Procedure(s): RIGHT INFERIOR PARATHYROIDECTOMY (N/A) as a surgical intervention.    The patient's history has been reviewed, patient examined, no change in status, stable for surgery.  I have reviewed the patient's chart and labs.  Questions were answered to the patient's satisfaction.    [name], [facility] Office: [place]

## 2019-03-07 NOTE — Anesthesia Procedure Notes (Signed)
Procedure Name: Intubation Date/Time: 03/07/2019 10:30 AM Performed by: Eben Burow, CRNA Pre-anesthesia Checklist: Patient identified, Emergency Drugs available, Suction available, Patient being monitored and Timeout performed Patient Re-evaluated:Patient Re-evaluated prior to induction Oxygen Delivery Method: Circle system utilized Preoxygenation: Pre-oxygenation with 100% oxygen Induction Type: IV induction and Rapid sequence Laryngoscope Size: Mac and 4 Grade View: Grade II Tube type: Oral Tube size: 7.0 mm Number of attempts: 1 Airway Equipment and Method: Stylet Placement Confirmation: ETT inserted through vocal cords under direct vision,  positive ETCO2 and breath sounds checked- equal and bilateral Secured at: 22 cm Tube secured with: Tape Dental Injury: Teeth and Oropharynx as per pre-operative assessment

## 2019-03-07 NOTE — Transfer of Care (Signed)
Immediate Anesthesia Transfer of Care Note  Patient: KEIMORA SWARTOUT  Procedure(s) Performed: RIGHT INFERIOR PARATHYROIDECTOMY (N/A Neck)  Patient Location: PACU  Anesthesia Type:General  Level of Consciousness: awake and alert   Airway & Oxygen Therapy: Patient Spontanous Breathing and Patient connected to face mask oxygen  Post-op Assessment: Report given to RN and Post -op Vital signs reviewed and stable  Post vital signs: Reviewed and stable  Last Vitals:  Vitals Value Taken Time  BP 158/65 03/07/2019 11:50 AM  Temp    Pulse 64 03/07/2019 11:51 AM  Resp 17 03/07/2019 11:51 AM  SpO2 98 % 03/07/2019 11:51 AM  Vitals shown include unvalidated device data.  Last Pain:  Vitals:   03/07/19 0845  TempSrc:   PainSc: 0-No pain         Complications: No apparent anesthesia complications

## 2019-03-07 NOTE — Anesthesia Preprocedure Evaluation (Signed)
Anesthesia Evaluation  Patient identified by MRN, date of birth, ID band Patient awake    Reviewed: Allergy & Precautions, NPO status , Patient's Chart, lab work & pertinent test results, reviewed documented beta blocker date and time   History of Anesthesia Complications Negative for: history of anesthetic complications  Airway Mallampati: II  TM Distance: >3 FB Neck ROM: Full    Dental  (+) Teeth Intact, Caps   Pulmonary neg shortness of breath, COPD, neg recent URI, former smoker,    breath sounds clear to auscultation       Cardiovascular hypertension, Pt. on medications and Pt. on home beta blockers  Rhythm:Regular     Neuro/Psych  Headaches, PSYCHIATRIC DISORDERS Anxiety Depression    GI/Hepatic negative GI ROS, Neg liver ROS,   Endo/Other  diabetesHypothyroidism PRIMARY HYPERPARATHYROIDISM  Renal/GU negative Renal ROS     Musculoskeletal  (+) Arthritis ,   Abdominal   Peds  Hematology negative hematology ROS (+)   Anesthesia Other Findings   Reproductive/Obstetrics                             Anesthesia Physical Anesthesia Plan  ASA: II  Anesthesia Plan: General   Post-op Pain Management:    Induction: Intravenous  PONV Risk Score and Plan: 3 and Ondansetron and Dexamethasone  Airway Management Planned: Oral ETT  Additional Equipment: None  Intra-op Plan:   Post-operative Plan: Extubation in OR  Informed Consent: I have reviewed the patients History and Physical, chart, labs and discussed the procedure including the risks, benefits and alternatives for the proposed anesthesia with the patient or authorized representative who has indicated his/her understanding and acceptance.     Dental advisory given  Plan Discussed with: CRNA and Surgeon  Anesthesia Plan Comments:         Anesthesia Quick Evaluation

## 2019-03-07 NOTE — Anesthesia Postprocedure Evaluation (Signed)
Anesthesia Post Note  Patient: Courtney Ryan  Procedure(s) Performed: RIGHT INFERIOR PARATHYROIDECTOMY (N/A Neck)     Patient location during evaluation: PACU Anesthesia Type: General Level of consciousness: awake and alert Pain management: pain level controlled Vital Signs Assessment: post-procedure vital signs reviewed and stable Respiratory status: spontaneous breathing, nonlabored ventilation, respiratory function stable and patient connected to nasal cannula oxygen Cardiovascular status: blood pressure returned to baseline and stable Postop Assessment: no apparent nausea or vomiting Anesthetic complications: no    Last Vitals:  Vitals:   03/07/19 1302 03/07/19 1312  BP: 131/69 140/65  Pulse: 62 62  Resp:    Temp: 36.8 C 36.6 C  SpO2: 95% 95%    Last Pain:  Vitals:   03/07/19 1312  TempSrc:   PainSc: 7                  Nicoletta Hush

## 2019-03-08 ENCOUNTER — Encounter (HOSPITAL_COMMUNITY): Payer: Self-pay | Admitting: Surgery

## 2019-03-14 ENCOUNTER — Other Ambulatory Visit: Payer: Self-pay

## 2019-03-14 MED ORDER — PREMARIN 0.625 MG/GM VA CREA: 1.0000 | TOPICAL_CREAM | Freq: Every evening | VAGINAL | 11 refills | Status: DC | PRN

## 2019-03-14 NOTE — Telephone Encounter (Signed)
Refill x one year °

## 2019-03-14 NOTE — Telephone Encounter (Signed)
Patient called to request a refill on her PREMARIN.

## 2019-03-16 ENCOUNTER — Other Ambulatory Visit: Payer: Self-pay | Admitting: Internal Medicine

## 2019-03-16 NOTE — Telephone Encounter (Signed)
Needs follow up appt with TSH this month

## 2019-03-18 ENCOUNTER — Telehealth: Payer: Self-pay | Admitting: Internal Medicine

## 2019-03-18 NOTE — Telephone Encounter (Signed)
Called Courtney Ryan to schedule appointment so she could get refill on Furosemide, she stated she could not come in right now that her husband was in hospital and would be coming home next week and will need round the clock care and she had just had some surgery her self and she wanted to wait ans see what her surgery results were. She was going to call [facility] and if she still needed the refill she would call back and make arrangements to come in for office visit.  [facility] - [place]   Furosemide 40 mg tablets

## 2019-03-25 ENCOUNTER — Encounter: Payer: Self-pay | Admitting: Internal Medicine

## 2019-03-25 ENCOUNTER — Telehealth: Payer: Self-pay | Admitting: Internal Medicine

## 2019-03-25 MED ORDER — SYNTHROID 75 MCG PO TABS: 75.0000 ug | ORAL_TABLET | Freq: Every day | ORAL | 2 refills | Status: DC

## 2019-03-25 MED ORDER — FUROSEMIDE 40 MG PO TABS: 40.0000 mg | ORAL_TABLET | Freq: Every day | ORAL | 1 refills | Status: DC

## 2019-03-25 NOTE — Telephone Encounter (Signed)
Have refilled Furosemide and thyroid med to [facility]

## 2019-03-25 NOTE — Telephone Encounter (Signed)
Hara called back today and scheduled an appointment for 04/08/19 for OV and TSH, she is having OV and labs for surgery follow up today, I ask her to have those notes faxed to [place]. She needs refill on synthroid to get by until office visit in July

## 2019-04-08 ENCOUNTER — Encounter: Payer: Self-pay | Admitting: Internal Medicine

## 2019-04-08 ENCOUNTER — Other Ambulatory Visit: Payer: Self-pay

## 2019-04-08 ENCOUNTER — Ambulatory Visit: Payer: Medicare Other | Admitting: Internal Medicine

## 2019-04-08 VITALS — BP 100/60 | HR 62 | Ht 62.5 in | Wt 161.0 lb

## 2019-04-08 DIAGNOSIS — F411 Generalized anxiety disorder: Secondary | ICD-10-CM

## 2019-04-08 DIAGNOSIS — Z8639 Personal history of other endocrine, nutritional and metabolic disease: Secondary | ICD-10-CM | POA: Diagnosis not present

## 2019-04-08 DIAGNOSIS — I10 Essential (primary) hypertension: Secondary | ICD-10-CM | POA: Diagnosis not present

## 2019-04-08 DIAGNOSIS — E039 Hypothyroidism, unspecified: Secondary | ICD-10-CM | POA: Diagnosis not present

## 2019-04-08 LAB — TSH: TSH: 0.32 mIU/L — ABNORMAL LOW (ref 0.40–4.50)

## 2019-04-08 LAB — T4, FREE: Free T4: 1.4 ng/dL (ref 0.8–1.8)

## 2019-04-08 MED ORDER — LEVOFLOXACIN 500 MG PO TABS: 500.0000 mg | ORAL_TABLET | Freq: Every day | ORAL | 0 refills | Status: DC

## 2019-04-08 NOTE — Progress Notes (Signed)
   Subjective:    Patient ID: Courtney Ryan, female    DOB: [birth year], 73 y.o.   MRN: [id number]  HPI 73 year old  For 6 month follow up.  Recently had a parathyroid adenoma removed by Dr. Harlow Asa and did well with her surgery.  She is feeling fairly well status post surgery.  .Hgb AIC drawn on May 26 was elevated at 6.5%.  The best hemoglobin A1c was 2 years ago at 6.1%.  7 months ago it was 6.4%.  History of hypothyroidism.  TSH is low at 0.32.  We will reduce thyroid replacement medication and she needs to follow-up here in 6 weeks.  She will now be on levothyroxine 0.05 mg daily.   Husband diagnosed with gastroesophageal cancer. Had surgery recently by Dr. Servando Snare.  She is anxious about this.  She has a history of hypertension and is on amlodipine and Tenormin.  Blood pressure is stable.  Have prescribed in January Xanax 0.25 mg twice daily as needed for anxiety.      Review of Systems complaining of mild ear pain.  She does get coughs from time to time and they usually are treated successfully with Levaquin.     Objective:   Physical Exam Vital signs reviewed and are stable.  Skin warm and dry.  Nodes none.  Neck is supple.  No adenopathy.  No thyromegaly.  Chest clear to auscultation.  Cardiac exam regular rate and rhythm.  Extremities without edema.  TMs and pharynx are clear.  There may be slight fullness of the TMs but they are not red.       Assessment & Plan:  Status post removal of parathyroid adenoma- incision is healing well.  She looks well.  Anxiety state secondary to husband's recent diagnosis of cancer.  She has Xanax to take for anxiety  Hypertension-stable on current regimen  Impaired glucose tolerance-stable  Appears to have serous otitis media.  TMs are full but not red.  Treat with Levaquin for 10 days since she recently had surgery.  Low TSH.  Decrease dose of thyroid replacement medication and follow-up in August.  25 minutes spent with patient  including interview, physical examination, medical decision making and prescribing medications.  Plan: Dose of thyroid replacement has been reduced and she will follow-up in August.  Her physical exam is due in December.  Levaquin 500 mg daily for 10 days for ear pain/respiratory infection status post parathyroid adenoma removal.  Xanax if needed for anxiety given husband's recent diagnosis of cancer.

## 2019-04-08 NOTE — Patient Instructions (Addendum)
Take Levaquin 500 mg daily x 10 days for ear pain.  Decrease thyroid replacement 0.05 mg daily and follow-up in August.  We will need RTC December for CPE and Medicare wellness.  Take Xanax for anxiety.

## 2019-04-09 ENCOUNTER — Other Ambulatory Visit: Payer: Self-pay

## 2019-04-09 MED ORDER — LEVOTHYROXINE SODIUM 50 MCG PO TABS: 50.0000 ug | ORAL_TABLET | Freq: Every day | ORAL | 1 refills | Status: DC

## 2019-04-12 MED ORDER — LEVOTHYROXINE SODIUM 50 MCG PO TABS: 50.0000 ug | ORAL_TABLET | Freq: Every day | ORAL | 1 refills | Status: DC

## 2019-05-20 ENCOUNTER — Other Ambulatory Visit: Payer: Medicare Other | Admitting: Internal Medicine

## 2019-05-20 ENCOUNTER — Other Ambulatory Visit: Payer: Self-pay

## 2019-05-20 DIAGNOSIS — E039 Hypothyroidism, unspecified: Secondary | ICD-10-CM

## 2019-05-21 ENCOUNTER — Encounter: Payer: Self-pay | Admitting: Internal Medicine

## 2019-05-21 ENCOUNTER — Ambulatory Visit: Payer: Medicare Other | Admitting: Internal Medicine

## 2019-05-21 DIAGNOSIS — Z23 Encounter for immunization: Secondary | ICD-10-CM | POA: Diagnosis not present

## 2019-05-21 DIAGNOSIS — F411 Generalized anxiety disorder: Secondary | ICD-10-CM

## 2019-05-21 LAB — TSH: TSH: 2.58 mIU/L (ref 0.40–4.50)

## 2019-06-02 NOTE — Patient Instructions (Signed)
Continue with same dose of thyroid replacement 0.05 mg daily.  Had physical exam in December.  Have flu vaccine early.

## 2019-06-02 NOTE — Progress Notes (Signed)
   Subjective:    Patient ID: Courtney Ryan, female    DOB: 1946-08-06, 73 y.o.   MRN: [id number]  HPI 73 year old Female last seen here in early July.  Dr. Lynford Humphrey had removed a parathyroid adenoma in June.  She did well with surgery.  In July her TSH was low at 0.32.  Thyroid replacement medication was reduced to 0.95 mg daily and she is now here for follow-up. Her husband has been ill.  It has been stressful.  Her TSH is now normal on this dose of thyroid replacement medication.   Review of Systems no new complaints     Objective:   Physical Exam  No thyromegaly.  Chest clear.  Cardiac exam regular rate and rhythm normal S1 and S2.  Extremities without edema.      Assessment & Plan:  Hypothyroidism-stable on current dose of levothyroxine 0.05 mg daily  Situational stress with husband's illness  Plan: Continue current medications and follow-up with annual health maintenance exam and Medicare wellness visit December 2020.  Have flu vaccine early

## 2019-06-13 ENCOUNTER — Other Ambulatory Visit: Payer: Self-pay

## 2019-06-13 MED ORDER — LEVOTHYROXINE SODIUM 50 MCG PO TABS: 50.0000 ug | ORAL_TABLET | Freq: Every day | ORAL | 1 refills | Status: DC

## 2019-06-14 ENCOUNTER — Other Ambulatory Visit: Payer: Self-pay | Admitting: Internal Medicine

## 2019-07-01 ENCOUNTER — Other Ambulatory Visit: Payer: Self-pay | Admitting: Internal Medicine

## 2019-07-01 DIAGNOSIS — Z1231 Encounter for screening mammogram for malignant neoplasm of breast: Secondary | ICD-10-CM

## 2019-07-05 ENCOUNTER — Ambulatory Visit: Payer: Medicare Other

## 2019-08-26 ENCOUNTER — Ambulatory Visit
Admission: RE | Admit: 2019-08-26 | Discharge: 2019-08-26 | Disposition: A | Discharge status: Home / Self Care | Payer: Medicare Other | Source: Ambulatory Visit | Attending: Internal Medicine | Admitting: Internal Medicine

## 2019-08-26 ENCOUNTER — Other Ambulatory Visit: Payer: Self-pay

## 2019-08-26 DIAGNOSIS — Z1231 Encounter for screening mammogram for malignant neoplasm of breast: Secondary | ICD-10-CM

## 2019-08-28 ENCOUNTER — Other Ambulatory Visit: Payer: Self-pay | Admitting: Internal Medicine

## 2019-08-28 DIAGNOSIS — R928 Other abnormal and inconclusive findings on diagnostic imaging of breast: Secondary | ICD-10-CM

## 2019-09-10 ENCOUNTER — Other Ambulatory Visit: Payer: Self-pay

## 2019-09-10 ENCOUNTER — Ambulatory Visit
Admission: RE | Admit: 2019-09-10 | Discharge: 2019-09-10 | Disposition: A | Discharge status: Home / Self Care | Payer: Medicare Other | Source: Ambulatory Visit | Attending: Internal Medicine | Admitting: Internal Medicine

## 2019-09-12 ENCOUNTER — Telehealth: Payer: Self-pay | Admitting: Internal Medicine

## 2019-09-12 MED ORDER — FUROSEMIDE 40 MG PO TABS: 40.0000 mg | ORAL_TABLET | Freq: Every day | ORAL | 3 refills | Status: DC

## 2019-09-12 MED ORDER — AMLODIPINE BESYLATE 5 MG PO TABS: 5.0000 mg | ORAL_TABLET | Freq: Every day | ORAL | 3 refills | Status: DC

## 2019-09-12 NOTE — Telephone Encounter (Signed)
Received Fax RX request from  [facility] [phone] Lady Gary, [place] Phone:  [phone]  Fax:  [phone]       Medication - amLODipine (NORVASC) 5 MG tablet  furosemide (LASIX) 40 MG tablet  Last Refill - 06/14/19  Last OV - 05/21/19  Last CPE - 09/21/18  Next Appointment - 09/22/2020

## 2019-09-13 ENCOUNTER — Other Ambulatory Visit: Payer: Medicare Other

## 2019-09-17 ENCOUNTER — Other Ambulatory Visit: Payer: Medicare Other | Admitting: Internal Medicine

## 2019-09-17 ENCOUNTER — Other Ambulatory Visit: Payer: Self-pay

## 2019-09-17 DIAGNOSIS — E119 Type 2 diabetes mellitus without complications: Secondary | ICD-10-CM

## 2019-09-17 DIAGNOSIS — E039 Hypothyroidism, unspecified: Secondary | ICD-10-CM

## 2019-09-17 DIAGNOSIS — Z0000 Encounter for general adult medical examination without abnormal findings: Secondary | ICD-10-CM

## 2019-09-17 DIAGNOSIS — E8881 Metabolic syndrome: Secondary | ICD-10-CM

## 2019-09-17 DIAGNOSIS — R7302 Impaired glucose tolerance (oral): Secondary | ICD-10-CM

## 2019-09-17 DIAGNOSIS — I10 Essential (primary) hypertension: Secondary | ICD-10-CM

## 2019-09-17 DIAGNOSIS — E782 Mixed hyperlipidemia: Secondary | ICD-10-CM

## 2019-09-18 LAB — CBC WITH DIFFERENTIAL/PLATELET
Absolute Monocytes: 720 cells/uL (ref 200–950)
Basophils Absolute: 63 cells/uL (ref 0–200)
Basophils Relative: 0.7 %
Eosinophils Absolute: 36 cells/uL (ref 15–500)
Eosinophils Relative: 0.4 %
HCT: 42.6 % (ref 35.0–45.0)
Hemoglobin: 14.3 g/dL (ref 11.7–15.5)
Lymphs Abs: 2934 cells/uL (ref 850–3900)
MCH: 29.7 pg (ref 27.0–33.0)
MCHC: 33.6 g/dL (ref 32.0–36.0)
MCV: 88.4 fL (ref 80.0–100.0)
MPV: 9.4 fL (ref 7.5–12.5)
Monocytes Relative: 8 %
Neutro Abs: 5247 cells/uL (ref 1500–7800)
Neutrophils Relative %: 58.3 %
Platelets: 308 10*3/uL (ref 140–400)
RBC: 4.82 10*6/uL (ref 3.80–5.10)
RDW: 14.3 % (ref 11.0–15.0)
Total Lymphocyte: 32.6 %
WBC: 9 10*3/uL (ref 3.8–10.8)

## 2019-09-18 LAB — COMPLETE METABOLIC PANEL WITH GFR
AG Ratio: 1.4 (calc) (ref 1.0–2.5)
ALT: 13 U/L (ref 6–29)
AST: 14 U/L (ref 10–35)
Albumin: 4 g/dL (ref 3.6–5.1)
Alkaline phosphatase (APISO): 78 U/L (ref 37–153)
BUN/Creatinine Ratio: 17 (calc) (ref 6–22)
BUN: 17 mg/dL (ref 7–25)
CO2: 28 mmol/L (ref 20–32)
Calcium: 9.7 mg/dL (ref 8.6–10.4)
Chloride: 105 mmol/L (ref 98–110)
Creat: 1 mg/dL — ABNORMAL HIGH (ref 0.60–0.93)
GFR, Est African American: 64 mL/min/{1.73_m2} (ref >=60)
GFR, Est Non African American: 55 mL/min/{1.73_m2} — ABNORMAL LOW (ref >=60)
Globulin: 2.8 g/dL (calc) (ref 1.9–3.7)
Glucose, Bld: 101 mg/dL — ABNORMAL HIGH (ref 65–99)
Potassium: 4.3 mmol/L (ref 3.5–5.3)
Sodium: 141 mmol/L (ref 135–146)
Total Bilirubin: 0.3 mg/dL (ref 0.2–1.2)
Total Protein: 6.8 g/dL (ref 6.1–8.1)

## 2019-09-18 LAB — MICROALBUMIN / CREATININE URINE RATIO
Creatinine, Urine: 256 mg/dL (ref 20–275)
Microalb Creat Ratio: 6 mcg/mg creat (ref <30)
Microalb, Ur: 1.6 mg/dL

## 2019-09-18 LAB — LIPID PANEL
Cholesterol: 166 mg/dL (ref <200)
HDL: 45 mg/dL — ABNORMAL LOW (ref >=50)
LDL Cholesterol (Calc): 95 mg/dL (calc)
Non-HDL Cholesterol (Calc): 121 mg/dL (calc) (ref <130)
Total CHOL/HDL Ratio: 3.7 (calc) (ref <5.0)
Triglycerides: 164 mg/dL — ABNORMAL HIGH (ref <150)

## 2019-09-18 LAB — HEMOGLOBIN A1C
Hgb A1c MFr Bld: 6.6 % of total Hgb — ABNORMAL HIGH (ref <5.7)
Mean Plasma Glucose: 143 (calc)
eAG (mmol/L): 7.9 (calc)

## 2019-09-18 LAB — TSH: TSH: 2.2 mIU/L (ref 0.40–4.50)

## 2019-09-23 ENCOUNTER — Encounter: Payer: Self-pay | Admitting: Internal Medicine

## 2019-09-23 ENCOUNTER — Ambulatory Visit (INDEPENDENT_AMBULATORY_CARE_PROVIDER_SITE_OTHER): Payer: Medicare Other | Admitting: Internal Medicine

## 2019-09-23 ENCOUNTER — Other Ambulatory Visit: Payer: Self-pay

## 2019-09-23 VITALS — BP 100/60 | HR 69 | Ht 62.5 in | Wt 154.0 lb

## 2019-09-23 DIAGNOSIS — J0101 Acute recurrent maxillary sinusitis: Secondary | ICD-10-CM

## 2019-09-23 DIAGNOSIS — Z8639 Personal history of other endocrine, nutritional and metabolic disease: Secondary | ICD-10-CM | POA: Diagnosis not present

## 2019-09-23 DIAGNOSIS — F4320 Adjustment disorder, unspecified: Secondary | ICD-10-CM

## 2019-09-23 DIAGNOSIS — E1169 Type 2 diabetes mellitus with other specified complication: Secondary | ICD-10-CM

## 2019-09-23 DIAGNOSIS — F411 Generalized anxiety disorder: Secondary | ICD-10-CM

## 2019-09-23 DIAGNOSIS — I10 Essential (primary) hypertension: Secondary | ICD-10-CM

## 2019-09-23 DIAGNOSIS — E039 Hypothyroidism, unspecified: Secondary | ICD-10-CM | POA: Diagnosis not present

## 2019-09-23 DIAGNOSIS — Z0001 Encounter for general adult medical examination with abnormal findings: Secondary | ICD-10-CM

## 2019-09-23 DIAGNOSIS — F4321 Adjustment disorder with depressed mood: Secondary | ICD-10-CM

## 2019-09-23 DIAGNOSIS — Z0000 Encounter for general adult medical examination without abnormal findings: Secondary | ICD-10-CM

## 2019-09-23 DIAGNOSIS — R609 Edema, unspecified: Secondary | ICD-10-CM

## 2019-09-23 DIAGNOSIS — E785 Hyperlipidemia, unspecified: Secondary | ICD-10-CM

## 2019-09-23 LAB — POCT URINALYSIS DIPSTICK
Appearance: NEGATIVE
Bilirubin, UA: NEGATIVE
Blood, UA: NEGATIVE
Glucose, UA: NEGATIVE
Ketones, UA: NEGATIVE
Leukocytes, UA: NEGATIVE
Nitrite, UA: NEGATIVE
Odor: NEGATIVE
Protein, UA: NEGATIVE
Spec Grav, UA: 1.01 (ref 1.010–1.025)
Urobilinogen, UA: 0.2 E.U./dL
pH, UA: 6.5 (ref 5.0–8.0)

## 2019-09-23 MED ORDER — PREDNISONE 10 MG PO TABS: ORAL_TABLET | ORAL | 0 refills | Status: DC

## 2019-09-23 MED ORDER — ALPRAZOLAM 0.25 MG PO TABS: ORAL_TABLET | ORAL | 2 refills | Status: DC

## 2019-09-23 MED ORDER — LEVOFLOXACIN 500 MG PO TABS: 500.0000 mg | ORAL_TABLET | Freq: Every day | ORAL | 0 refills | Status: DC

## 2019-09-23 NOTE — Progress Notes (Signed)
Subjective:    Patient ID: Courtney Ryan, female    DOB: 05-Sep-1946, 73 y.o.   MRN: [id number]  HPI 73 year old Female seen today for Medicare wellness, annual health maintenance exam and evaluation of medical issues.  She tells me today that her husband has terminal esophageal cancer and has been referred to Hospice.  She is dealing with this extremely well.  Requested a refill of her Xanax because sometimes she does not sleep well.  I have agreed to do this.  Recently has developed a cough.  Has history of cough and recurrent sinusitis that responds to Levaquin and a tapering course of prednisone.  Complaining of postnasal drip with her cough.  Says cough is not productive.  These medications were refilled today.  She has a history of hyperparathyroidism and in June 2020 underwent removal of right inferior parathyroid gland by Dr. Harlow Asa.  She has a history of hypothyroidism with history of goiter, dependent edema, hypertension, hyperlipidemia, fibrocystic breast disease, history of migraine headaches, osteoarthritis, osteopenia and vitamin D deficiency. She is allergic to penicillin and sulfa.  Sulfa causes rash and hives.  Penicillin causes vomiting.  She saw Dr. Zollie Beckers in December 2017 for trochanteric bursitis of left hip.  He did x-ray of her hip December 2017 hip joint was unremarkable.  History of bacterial vaginosis.  In March 2018 she saw Dr. Fredna Dow for trigger finger of right thumb.  Past medical history: In 1968 she had an ovary removed because of the cyst.  In the 1970s she had an abnormal nevus removed from her left calf.  She had colonoscopy in 2006 in the [place].  Right breast biopsy January 2009 which was benign.  In 2017 she was complaining of right groin and right lower quadrant abdominal pain.  CT of the abdomen and pelvis were performed.  She was found to have fatty liver and a notable ventral hernia containing only fat.  She had a left adrenal adenoma.  She  had aortoiliac atherosclerosis.  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  No demonstrable mass or adenopathy in the abdomen.  She has a history of irritable bowel syndrome.  Social history: She smokes about 5 cigarettes daily.  Drinks wine a couple times a week.  She and her husband are retired.  She has a college degree.  No children.  Relatives live in [place] but cannot visit now due to the COVID-19 outbreak.  She has good friends here and neighbors.  Family history: Father died at age 52 of lung cancer.  Mother died at age 41 with cancer of the liver in 97.   Review of Systems  Constitutional: Negative.   HENT: Positive for postnasal drip.   Respiratory: Positive for cough.   Cardiovascular: Negative.   Genitourinary: Negative.   Neurological: Negative.   Psychiatric/Behavioral: Negative.        Objective:   Physical Exam Weight is 154 pounds.  BMI 27.72.  Weight is actually 5 pounds less than last year at this time.  Pulse 69 and regular.  Pulse oximetry 93%.  Blood pressure 100/60.  Skin warm and dry.  Nodes none.  No adenopathy.  No thyromegaly.  Chest is clear to auscultation.  TMs are clear.  No carotid bruits.  Cardiac exam regular rate and rhythm normal S1 and S2 without murmurs or gallops.  Breast without masses.  Abdomen soft nondistended without hepatosplenomegaly masses or tenderness.  Bimanual exam is normal.  No lower extremity edema.  She is alert and  oriented x3 with no gross focal deficits on brief neurological exam.  Judgment follow-up and affect are normal.       Assessment & Plan:  Cough and postnasal drip with history of recurrent sinusitis that generally responds to Levaquin and prednisone.  Was prescribed Levaquin 500 mg daily for 10 days and a tapering course of prednisone 10 mg (# 21 tablets) going from 60 mg to 0 mg over 7 days.  Situational stress with husband who has been placed in hospice care due to terminal esophageal cancer.  Xanax refilled at patient  request.  History of hyperparathyroidism status post removal of right inferior parathyroid gland by Dr. Lynford Humphrey June 2020.  No symptoms.  Impaired glucose tolerance- does not want to be on medication- continue to monitor. It is 6.6%. Follow up in 6 months.  Hyperlipidemia-she is on Crestor 5 mg twice a week- triglycerides 164 and HDL low at 45. Must better lipid control than 2 years ago.  History of vitamin D deficiency-May take over-the-counter vitamin D  History of irritable bowel syndrome  History of left trochanteric bursitis  Hypertension treated with 2 drug regimen and Lasix  Dependent edema treated with Lasix  Hypothyroidism stable on thyroid replacement  History of fibrocystic breast disease-has annual mammogram  Plan: Return in 6 months or as needed.  May take Xanax for anxiety with husband's illness and continue current medications.  She will call if she needs to be seen earlier than 6 months or if we can help in any way through this difficult time.  Subjective:   Patient presents for Medicare Annual/Subsequent preventive examination.  Review Past Medical/Family/Social:   Risk Factors  Current exercise habits:  Dietary issues discussed:   Cardiac risk factors:  Depression Screen  (Note: if answer to either of the following is "Yes", a more complete depression screening is indicated)   Over the past two weeks, have you felt down, depressed or hopeless?  Yes due to my husband's terminal illness Over the past two weeks, have you felt little interest or pleasure in doing things?  Yes due to my husband's terminal illness Have you lost interest or pleasure in daily life? No Do you often feel hopeless?  Yes due to my husband's terminal illness Do you cry easily over simple problems? No   Activities of Daily Living  In your present state of health, do you have any difficulty performing the following activities?:   Driving? No  Managing money? No  Feeding yourself?  No  Getting from bed to chair? No  Climbing a flight of stairs? No  Preparing food and eating?: No  Bathing or showering? No  Getting dressed: No  Getting to the toilet? No  Using the toilet:No  Moving around from place to place: No  In the past year have you fallen or had a near fall?:No  Are you sexually active? No  Do you have more than one partner? No   Hearing Difficulties: No  Do you often ask people to speak up or repeat themselves? No  Do you experience ringing or noises in your ears? No  Do you have difficulty understanding soft or whispered voices? No  Do you feel that you have a problem with memory? No Do you often misplace items? No    Home Safety:  Do you have a smoke alarm at your residence? Yes Do you have grab bars in the bathroom?  None Do you have throw rugs in your house?  None  Cognitive Testing  Alert? Yes Normal Appearance?Yes  Oriented to person? Yes Place? Yes  Time? Yes  Recall of three objects? Yes  Can perform simple calculations? Yes  Displays appropriate judgment?Yes  Can read the correct time from a watch face?Yes   List the Names of Other Physician/Practitioners you currently use:  See referral list for the physicians patient is currently seeing.     Review of Systems: See above   Objective:     General appearance: Appears younger than stated age  Head: Normocephalic, without obvious abnormality, atraumatic  Eyes: conj clear, EOMi PEERLA  Ears: normal TM's and external ear canals both ears  Nose: Nares normal. Septum midline. Mucosa normal. No drainage or sinus tenderness.  Throat: lips, mucosa, and tongue normal; teeth and gums normal  Neck: no adenopathy, no carotid bruit, no JVD, supple, symmetrical, trachea midline and thyroid not enlarged, symmetric, no tenderness/mass/nodules  No CVA tenderness.  Lungs: clear to auscultation bilaterally  Breasts: normal appearance, no masses or tenderness Heart: regular rate and rhythm,  S1, S2 normal, no murmur, click, rub or gallop  Abdomen: soft, non-tender; bowel sounds normal; no masses, no organomegaly  Musculoskeletal: ROM normal in all joints, no crepitus, no deformity, Normal muscle strengthen. Back  is symmetric, no curvature. Skin: Skin color, texture, turgor normal. No rashes or lesions  Lymph nodes: Cervical, supraclavicular, and axillary nodes normal.  Neurologic: CN 2 -12 Normal, Normal symmetric reflexes. Normal coordination and gait  Psych: Alert & Oriented x 3, Mood appear stable.    Assessment:    Annual wellness medicare exam   Plan:    During the course of the visit the patient was educated and counseled about appropriate screening and preventive services including:   Have COVID-19 vaccine when available     Patient Instructions (the written plan) was given to the patient.  Medicare Attestation  I have personally reviewed:  The patient's medical and social history  Their use of alcohol, tobacco or illicit drugs  Their current medications and supplements  The patient's functional ability including ADLs,fall risks, home safety risks, cognitive, and hearing and visual impairment  Diet and physical activities  Evidence for depression or mood disorders  The patient's weight, height, BMI, and visual acuity have been recorded in the chart. I have made referrals, counseling, and provided education to the patient based on review of the above and I have provided the patient with a written personalized care plan for preventive services.

## 2019-10-22 ENCOUNTER — Telehealth: Payer: Self-pay | Admitting: Internal Medicine

## 2019-10-22 NOTE — Telephone Encounter (Signed)
Called and let the [facility] know that patient would call and schedule appointment as soon as she is able to. She is taking care of terminally ill husband at this time.

## 2019-10-22 NOTE — Telephone Encounter (Signed)
Called and spoke with patient about letter we received from the [facility] about scheduling follow up appointment. She is unable to schedule anything at this time, her husband is terminal ill, hospice has been called in. She will address this at a later date.

## 2019-10-27 NOTE — Patient Instructions (Signed)
Take antibiotic and steroid as prescribed for sinusitis symptoms.  We are sorry to hear of your husband's terminal illness.  Take Xanax sparingly for anxiety.  Have COVID-19 vaccine when available.  Continue current medications as prescribed.  Return in 6 months or as needed.

## 2019-11-11 ENCOUNTER — Telehealth: Payer: Self-pay | Admitting: Internal Medicine

## 2019-11-11 MED ORDER — ROSUVASTATIN CALCIUM 5 MG PO TABS: ORAL_TABLET | ORAL | 3 refills | Status: DC

## 2019-11-11 NOTE — Telephone Encounter (Signed)
Received Fax RX request from  [facility] [phone] Lady Gary, [place] Phone:  [phone]  Fax:  [phone]       Medication - rosuvastatin (CRESTOR) 5 MG tablet    Last Refill - 08/15/19  Last OV - 09/23/19  Last CPE - 09/23/19  Next Appointment -

## 2019-11-20 ENCOUNTER — Other Ambulatory Visit: Payer: Self-pay

## 2019-11-20 ENCOUNTER — Encounter: Payer: Self-pay | Admitting: Internal Medicine

## 2019-11-20 ENCOUNTER — Telehealth: Payer: Self-pay | Admitting: Internal Medicine

## 2019-11-20 ENCOUNTER — Ambulatory Visit (INDEPENDENT_AMBULATORY_CARE_PROVIDER_SITE_OTHER): Payer: Medicare Other | Admitting: Internal Medicine

## 2019-11-20 VITALS — Ht 62.5 in | Wt 154.0 lb

## 2019-11-20 DIAGNOSIS — F439 Reaction to severe stress, unspecified: Secondary | ICD-10-CM

## 2019-11-20 DIAGNOSIS — J0101 Acute recurrent maxillary sinusitis: Secondary | ICD-10-CM

## 2019-11-20 MED ORDER — LEVOFLOXACIN 500 MG PO TABS: 500.0000 mg | ORAL_TABLET | Freq: Every day | ORAL | 0 refills | Status: DC

## 2019-11-20 MED ORDER — PREDNISONE 10 MG PO TABS: ORAL_TABLET | ORAL | 0 refills | Status: DC

## 2019-11-20 NOTE — Telephone Encounter (Signed)
Scheduled

## 2019-11-20 NOTE — Telephone Encounter (Signed)
Set up virtual visit now

## 2019-11-20 NOTE — Patient Instructions (Signed)
We are sorry you are not feeling well today.  We are calling in Levaquin 500 mg daily for 10 days for you.  We are calling in a prednisone tapering course as you have taken previously.  Please go get COVID-19 testing at test center.

## 2019-11-20 NOTE — Progress Notes (Signed)
   Subjective:    Patient ID: Courtney Ryan, female    DOB: 03-22-1946, 74 y.o.   MRN: [id number]  HPI 74 year old Female with history of bouts of sinusitis from time to time called with complaint of sinus headache and runny nose.  Was seen in late December for health maintenance exam and had a bout of sinusitis at that time and was treated with a tapering course of prednisone and the 10-day course of Levaquin.  Her husband is under hospice care.  She says he recently has been placed on methadone and she is taking care of him at home.  The only person to enter the home and is a hospice nurse.  Otherwise she has no known COVID-19 exposure but I would like for her to be tested with her symptoms.  She agrees to get an appointment for testing.  She has no fever or shaking chills.  Slight dysgeusia but she contributes that to her sinus infection.  Due to her situation and the Coronavirus pandemic, she is seen today via interactive audio and video telecommunications.  She is identified using 2 identifiers as Courtney Ryan, a longstanding patient in this practice.  She is agreeable to visit in this format today.    Review of Systems see above-no nausea     Objective:   Physical Exam  Seen virtually in no acute distress.  Has not checked her vital signs at home.  She sounds nasally congested and is somewhat tearful when discussing her husband's situation      Assessment & Plan:  Acute maxillary sinusitis  Rule out COVID-19 infection-recommend testing  Situational stress with husband with end-stage cancer under Hospice care  Plan: Levaquin 500 mg daily for 10 days.  Prednisone 10 mg (#21) going from 60 mg to 0 mg over 7 days.  May take over-the-counter analgesic for headache.  Recommend COVID-19 testing.

## 2019-11-20 NOTE — Telephone Encounter (Signed)
Courtney Ryan [phone]  Courtney Ryan called to say that she is having her normal sinus infection, Headache, nasal congestion, runny nose, coughing a little for last 3-4 days. No COVID exposure, No fever. She wanted to knoe if you could call her in an antibiotic.

## 2019-11-24 ENCOUNTER — Ambulatory Visit: Payer: Medicare Other | Attending: Internal Medicine

## 2019-11-24 DIAGNOSIS — Z23 Encounter for immunization: Secondary | ICD-10-CM

## 2019-11-24 NOTE — Progress Notes (Signed)
   Covid-19 Vaccination Clinic  Name:  Raksha Najafi    MRN: [id number] DOB: 08/26/1946  11/24/2019  Ms. Crichlow was observed post Covid-19 immunization for 30 minutes based on pre-vaccination screening without incidence. She was provided with Vaccine Information Sheet and instruction to access the V-Safe system.   Ms. Bastion was instructed to call 911 with any severe reactions post vaccine: Marland Kitchen Difficulty breathing  . Swelling of your face and throat  . A fast heartbeat  . A bad rash all over your body  . Dizziness and weakness    Immunizations Administered    Name Date Dose VIS Date Route   Pfizer COVID-19 Vaccine 11/24/2019  8:19 AM 0.3 mL 09/13/2019 Intramuscular   Manufacturer: [place]   Lot: [id number]   [place]: [id number]

## 2019-11-25 ENCOUNTER — Ambulatory Visit: Payer: Medicare Other | Attending: Internal Medicine

## 2019-11-25 DIAGNOSIS — Z20822 Contact with and (suspected) exposure to covid-19: Secondary | ICD-10-CM

## 2019-11-26 LAB — NOVEL CORONAVIRUS, NAA: SARS-CoV-2, NAA: NOT DETECTED

## 2019-12-09 ENCOUNTER — Other Ambulatory Visit: Payer: Self-pay

## 2019-12-09 MED ORDER — LEVOTHYROXINE SODIUM 50 MCG PO TABS: 50.0000 ug | ORAL_TABLET | Freq: Every day | ORAL | 1 refills | Status: DC

## 2019-12-17 ENCOUNTER — Ambulatory Visit: Payer: Medicare Other | Attending: Internal Medicine

## 2019-12-17 DIAGNOSIS — Z23 Encounter for immunization: Secondary | ICD-10-CM

## 2019-12-17 NOTE — Progress Notes (Signed)
   Covid-19 Vaccination Clinic  Name:  Enayah Overson    MRN: [id number] DOB: [birth year]  12/17/2019  Ms. Thane was observed post Covid-19 immunization for 15 minutes without incident. She was provided with Vaccine Information Sheet and instruction to access the V-Safe system.   Ms. Schranz was instructed to call 911 with any severe reactions post vaccine: Marland Kitchen Difficulty breathing  . Swelling of face and throat  . A fast heartbeat  . A bad rash all over body  . Dizziness and weakness   Immunizations Administered    Name Date Dose VIS Date Route   Pfizer COVID-19 Vaccine 12/17/2019  2:37 PM 0.3 mL 09/13/2019 Intramuscular   Manufacturer: [place]   Lot: [id number]   [place]: [id number]

## 2020-03-06 ENCOUNTER — Other Ambulatory Visit: Payer: Self-pay

## 2020-03-06 ENCOUNTER — Other Ambulatory Visit: Payer: Medicare Other | Admitting: Internal Medicine

## 2020-03-06 DIAGNOSIS — E039 Hypothyroidism, unspecified: Secondary | ICD-10-CM

## 2020-03-06 DIAGNOSIS — E119 Type 2 diabetes mellitus without complications: Secondary | ICD-10-CM

## 2020-03-06 DIAGNOSIS — Z0000 Encounter for general adult medical examination without abnormal findings: Secondary | ICD-10-CM

## 2020-03-06 DIAGNOSIS — E782 Mixed hyperlipidemia: Secondary | ICD-10-CM

## 2020-03-07 LAB — HEPATIC FUNCTION PANEL
AG Ratio: 1.6 (calc) (ref 1.0–2.5)
ALT: 13 U/L (ref 6–29)
AST: 13 U/L (ref 10–35)
Albumin: 4.2 g/dL (ref 3.6–5.1)
Alkaline phosphatase (APISO): 83 U/L (ref 37–153)
Bilirubin, Direct: 0.1 mg/dL (ref 0.0–0.2)
Globulin: 2.6 g/dL (calc) (ref 1.9–3.7)
Indirect Bilirubin: 0.3 mg/dL (calc) (ref 0.2–1.2)
Total Bilirubin: 0.4 mg/dL (ref 0.2–1.2)
Total Protein: 6.8 g/dL (ref 6.1–8.1)

## 2020-03-07 LAB — TSH: TSH: 2.3 mIU/L (ref 0.40–4.50)

## 2020-03-07 LAB — LIPID PANEL
Cholesterol: 166 mg/dL (ref <200)
HDL: 44 mg/dL — ABNORMAL LOW (ref >=50)
LDL Cholesterol (Calc): 94 mg/dL (calc)
Non-HDL Cholesterol (Calc): 122 mg/dL (calc) (ref <130)
Total CHOL/HDL Ratio: 3.8 (calc) (ref <5.0)
Triglycerides: 181 mg/dL — ABNORMAL HIGH (ref <150)

## 2020-03-07 LAB — HEMOGLOBIN A1C
Hgb A1c MFr Bld: 6.4 % of total Hgb — ABNORMAL HIGH (ref <5.7)
Mean Plasma Glucose: 137 (calc)
eAG (mmol/L): 7.6 (calc)

## 2020-03-09 ENCOUNTER — Encounter: Payer: Self-pay | Admitting: Internal Medicine

## 2020-03-09 ENCOUNTER — Other Ambulatory Visit: Payer: Self-pay

## 2020-03-09 ENCOUNTER — Ambulatory Visit: Payer: Medicare Other | Admitting: Internal Medicine

## 2020-03-09 VITALS — BP 110/70 | HR 76 | Ht 62.5 in | Wt 150.0 lb

## 2020-03-09 DIAGNOSIS — E039 Hypothyroidism, unspecified: Secondary | ICD-10-CM

## 2020-03-09 DIAGNOSIS — F4321 Adjustment disorder with depressed mood: Secondary | ICD-10-CM

## 2020-03-09 DIAGNOSIS — I10 Essential (primary) hypertension: Secondary | ICD-10-CM

## 2020-03-09 DIAGNOSIS — E1169 Type 2 diabetes mellitus with other specified complication: Secondary | ICD-10-CM

## 2020-03-09 DIAGNOSIS — E785 Hyperlipidemia, unspecified: Secondary | ICD-10-CM

## 2020-03-09 DIAGNOSIS — Z8639 Personal history of other endocrine, nutritional and metabolic disease: Secondary | ICD-10-CM

## 2020-03-09 DIAGNOSIS — J22 Unspecified acute lower respiratory infection: Secondary | ICD-10-CM

## 2020-03-09 MED ORDER — LEVOFLOXACIN 500 MG PO TABS: 500.0000 mg | ORAL_TABLET | Freq: Every day | ORAL | 0 refills | Status: DC

## 2020-03-09 MED ORDER — PREDNISONE 10 MG PO TABS: ORAL_TABLET | ORAL | 0 refills | Status: DC

## 2020-03-09 NOTE — Progress Notes (Signed)
   Subjective:    Patient ID: Courtney Ryan, female    DOB: [birth year], 74 y.o.   MRN: [id number]  HPI 74 year old Female for 6 month recheck.  Her husband passed away from terminal esophageal cancer.  She is downsizing to a smaller home.  She has been selling some of her furniture.  She has a history of hyperparathyroidism.  In June 2020 underwent removal of right inferior parathyroid gland by Dr. Harlow Asa.  She has a history of hypothyroidism with goiter, dependent edema, hypertension, hyperlipidemia, fibrocystic breast disease, history of migraine headaches, osteoarthritis, osteopenia and vitamin D deficiency.  Has seen Dr. Ninfa Linden in December 2017 for trochanteric bursitis of left hip.  X-ray was unremarkable.  In March 2018 she saw Dr. Fredna Dow for trigger finger right thumb.  She has a history of irritable bowel syndrome.  She smokes about 5 cigarettes daily and drinks a couple of glasses of wine a week.  Her relatives live in [place] but cannot visit due to the COVID-19 outbreak.  She has good friends.  Neighbors.  With regard to impaired glucose tolerance hemoglobin A1c is stable at 6.4% with diet alone which is excellent.  Her TSH is normal at 2.30.  She has elevated triglycerides of 181 and previously were 164 in December 2020.  Liver functions are normal on Crestor 5 mg 3 times a week.  Review of Systems-appropriate grief with loss of husband. She has had 2 COVID-19 immunizations.  Complaining of cough    Objective:   Physical Exam Blood pressure 110/70 pulse 76 pulse oximetry 97% weight 150 pounds height 5 feet 2.5 inches BMI 27  Skin warm and dry.  No cervical adenopathy.  No carotid bruits.  No thyromegaly.  Chest clear to auscultation.  Cardiac exam regular rate and rhythm.  No lower extremity edema.  Affect thought and judgment are normal.       Assessment & Plan:  Grief reaction due to loss of husband  Essential hypertension stable on current regimen of Tenormin and  amlodipine as well as Lasix   Impaired glucose tolerance-stable with diet alone  Hyperlipidemia-stable with Crestor 5 mg 3 times a week  History of removal of parathyroid adenoma by Dr. Harlow Asa  Lower respiratory infection to be treated with Levaquin 500 mg daily for 10 days and tapering course of prednisone starting with 60 mg and decreasing by 10 mg daily 6-5-4-3-2-1 taper.  Antibiotic and steroids generally resolve her cough

## 2020-04-01 NOTE — Patient Instructions (Signed)
Take Levaquin 500 mg daily for 10 days and prednisone in tapering course as directed.  Continue diet and exercise efforts.  No change in medications that you have been taking chronically.  Follow-up in 6 months.  We are sorry to hear about your husband's passing.

## 2020-06-08 ENCOUNTER — Other Ambulatory Visit: Payer: Self-pay | Admitting: Internal Medicine

## 2020-06-09 ENCOUNTER — Telehealth: Payer: Self-pay | Admitting: Internal Medicine

## 2020-06-09 NOTE — Telephone Encounter (Signed)
Patient scheduled.

## 2020-06-09 NOTE — Telephone Encounter (Signed)
We can do car visit tomorrow with Covid and Respiratory virus panels

## 2020-06-09 NOTE — Telephone Encounter (Signed)
Pt said she has a sinus infection coming back up, she said she had one 3 or 4 months ago as well. She wanted to know what you suggest

## 2020-06-10 ENCOUNTER — Ambulatory Visit (INDEPENDENT_AMBULATORY_CARE_PROVIDER_SITE_OTHER): Payer: Medicare Other | Admitting: Internal Medicine

## 2020-06-10 ENCOUNTER — Ambulatory Visit (HOSPITAL_COMMUNITY)
Admission: RE | Admit: 2020-06-10 | Discharge: 2020-06-10 | Disposition: A | Discharge status: Home / Self Care | Payer: Medicare Other | Source: Ambulatory Visit | Attending: Internal Medicine | Admitting: Internal Medicine

## 2020-06-10 ENCOUNTER — Other Ambulatory Visit: Payer: Self-pay

## 2020-06-10 ENCOUNTER — Other Ambulatory Visit: Payer: Self-pay | Admitting: Internal Medicine

## 2020-06-10 ENCOUNTER — Encounter: Payer: Self-pay | Admitting: Internal Medicine

## 2020-06-10 VITALS — HR 62 | Temp 98.7°F

## 2020-06-10 DIAGNOSIS — R059 Cough, unspecified: Secondary | ICD-10-CM

## 2020-06-10 DIAGNOSIS — I10 Essential (primary) hypertension: Secondary | ICD-10-CM

## 2020-06-10 DIAGNOSIS — R0789 Other chest pain: Secondary | ICD-10-CM

## 2020-06-10 DIAGNOSIS — R05 Cough: Secondary | ICD-10-CM | POA: Diagnosis not present

## 2020-06-10 DIAGNOSIS — R0902 Hypoxemia: Secondary | ICD-10-CM

## 2020-06-10 DIAGNOSIS — E1169 Type 2 diabetes mellitus with other specified complication: Secondary | ICD-10-CM

## 2020-06-10 DIAGNOSIS — E785 Hyperlipidemia, unspecified: Secondary | ICD-10-CM

## 2020-06-10 DIAGNOSIS — R0602 Shortness of breath: Secondary | ICD-10-CM

## 2020-06-10 DIAGNOSIS — J22 Unspecified acute lower respiratory infection: Secondary | ICD-10-CM

## 2020-06-10 MED ORDER — LEVOFLOXACIN 500 MG PO TABS
500.0000 mg | ORAL_TABLET | Freq: Every day | ORAL | 0 refills | Status: DC
Start: 2020-06-10 — End: 2020-06-19

## 2020-06-10 MED ORDER — PREDNISONE 10 MG PO TABS
ORAL_TABLET | ORAL | 0 refills | Status: DC
Start: 2020-06-10 — End: 2020-06-19

## 2020-06-10 NOTE — Progress Notes (Addendum)
   Subjective:    Patient ID: Courtney Ryan, female    DOB: March 09, 1946, 74 y.o.   MRN: [id number]  HPI 74 year old Female former smoker with history of essential hypertension, impaired glucose tolerance, and hyperlipidemia status post removal of parathyroid adenoma by Dr. Harlow Asa presents with a several day history of shortness of breath, fatigue, malaise but no documented fever.  No known COVID-19 exposure.  Was vaccinated for Covid in February and March of this year.  Patient feels that she has sinusitis symptoms with pressure in her maxillary sinuses bilaterally.  Has felt short of breath at times.  No hemoptysis.  Has felt some chest tightness but no wheezing.  Was seen in June with maxillary sinusitis treated with Levaquin and a course of steroids and improved.      Review of Systems see above-denies fever or shaking chills nausea vomiting dysgeusia or myalgias or headache     Objective:   Physical Exam Temperature 98.7 degrees pulse 62 regular pulse oximetry 94% and briefly improved to 95% with deep breathing.  Skin pale warm and dry.  Left TM obscured by cerumen right TM is clear.  Pharynx is slightly injected without exudate.  Neck is supple.  Chest clear to auscultation without rales or wheezing.  Cardiac exam regular rate and rhythm.       Assessment & Plan:  Shortness of breath-pulse oximetry 94% which is concerning.  Pulse oximetry in June was 97%.  She will have chest x-ray now.  Starting her on Levaquin 500 mg daily for 10 days and a course of prednisone starting with 60 mg day 1 and decreasing by 10 mg daily over 6 days.  She is to purchase a pulse oximeter and monitor her pulse ox.  Covid-19 test ordered. Respiratory virus panel ordered.  Rest and stay at home. Stay well hydrated.  Addendum: Patient has left linear basilar opacities with radiologist favoring atelectasis. Suggest patient return tomorrow for CBC,diff, and BNP. Consider chest CT. Message left at 5:45pm

## 2020-06-10 NOTE — Addendum Note (Signed)
Addended by: Mady Haagensen on: 06/10/2020 12:58 PM   Modules accepted: Orders

## 2020-06-10 NOTE — Patient Instructions (Addendum)
Patient is to have CXR now.  COVID-19 test obtained as well as respiratory virus panel.  Patient will take Levaquin 500 mg daily for 10 days and a tapering course of prednisone starting with 60 mg day 1 and decreasing by 10 mg daily over 6 days.  She is to purchase a pulse oximetry on device and measure her pulse oximetry frequently.

## 2020-06-11 ENCOUNTER — Encounter: Payer: Self-pay | Admitting: Internal Medicine

## 2020-06-11 ENCOUNTER — Ambulatory Visit (INDEPENDENT_AMBULATORY_CARE_PROVIDER_SITE_OTHER): Payer: Medicare Other | Admitting: Internal Medicine

## 2020-06-11 VITALS — BP 110/60 | HR 74 | Temp 98.6°F | Ht 62.5 in | Wt 150.0 lb

## 2020-06-11 DIAGNOSIS — R0602 Shortness of breath: Secondary | ICD-10-CM

## 2020-06-11 DIAGNOSIS — J22 Unspecified acute lower respiratory infection: Secondary | ICD-10-CM | POA: Diagnosis not present

## 2020-06-11 LAB — RESPIRATORY VIRUS PANEL

## 2020-06-11 LAB — SARS-COV-2 RNA,(COVID-19) QUALITATIVE NAAT: SARS CoV2 RNA: NOT DETECTED

## 2020-06-11 NOTE — Patient Instructions (Signed)
Continue Levaquin and prednisone as prescribed yesterday.  Await results of COVID-19 testing and respiratory virus panel results.  Rest at home.  Cultures pulse oximeter and measure oxygen level 2-3 times daily.  Follow-up next week.

## 2020-06-11 NOTE — Progress Notes (Signed)
   Subjective:    Patient ID: Courtney Ryan, female    DOB: 1946-01-14, 74 y.o.   MRN: [id number]  HPI She was here yesterday with complaint of lower respiratory infection symptoms.  Says she has not really been outside playing cards with some friends and none of them are ill.  She is a former smoker.  History of essential hypertension impaired glucose tolerance and hyperlipidemia, history of parathyroid adenoma removed by Dr. Harlow Asa.  Patient complains of shortness of breath fatigue malaise but no documented fever.  Had Covid vaccines in February and March of this year.  Feels sinus pressure.  Has maxillary sinus pressure bilaterally.  Has felt some shortness of breath.  Pulse oximetry today has improved from yesterday when it was 94% and it is now 96%.  Because of hypoxia yesterday we got a chest x-ray which showed linear left basilar opacities, favoring atelectasis according to radiologist.  No consolidation.  Borderline cardiomegaly.  Today we drew a CBC and a BMP.  COVID-19 and respiratory virus panel was obtained yesterday are still pending.  She failed to purchase a pulse oximetry at pharmacy and was advised to do so.  Yesterday she was treated with Levaquin and steroids orally.  I think the steroids have helped her pulse oximetry.  Review of Systems see above-no nausea and vomiting fever chills dysgeusia     Objective:   Physical Exam Blood pressure 110/60 pulse 74 regular temperature 98.6 degrees pulse oximetry 96% weight 150 pounds  Her chest is clear to auscultation today.  I did not appreciate any rales or wheezing.     Assessment & Plan:  Protracted lower respiratory infection with?  Atelectasis  Acute maxillary sinusitis  Plan: Continue course of Levaquin and prednisone as prescribed yesterday.  Await results for COVID-19 testing and respiratory virus panel results.  Rest at home.  Purchase pulse oximetry and measure oxygen at least 2-3 times a day.  Follow-up next week.  Call  if symptoms worsen.

## 2020-06-12 LAB — CBC WITH DIFFERENTIAL/PLATELET
Absolute Monocytes: 760 cells/uL (ref 200–950)
Basophils Absolute: 67 cells/uL (ref 0–200)
Basophils Relative: 0.7 %
Eosinophils Absolute: 67 cells/uL (ref 15–500)
Eosinophils Relative: 0.7 %
HCT: 42.5 % (ref 35.0–45.0)
Hemoglobin: 14 g/dL (ref 11.7–15.5)
Lymphs Abs: 1786 cells/uL (ref 850–3900)
MCH: 29.2 pg (ref 27.0–33.0)
MCHC: 32.9 g/dL (ref 32.0–36.0)
MCV: 88.5 fL (ref 80.0–100.0)
MPV: 9.5 fL (ref 7.5–12.5)
Monocytes Relative: 8 %
Neutro Abs: 6821 cells/uL (ref 1500–7800)
Neutrophils Relative %: 71.8 %
Platelets: 290 10*3/uL (ref 140–400)
RBC: 4.8 10*6/uL (ref 3.80–5.10)
RDW: 13.8 % (ref 11.0–15.0)
Total Lymphocyte: 18.8 %
WBC: 9.5 10*3/uL (ref 3.8–10.8)

## 2020-06-12 LAB — BRAIN NATRIURETIC PEPTIDE: Brain Natriuretic Peptide: 38 pg/mL (ref <100)

## 2020-06-19 ENCOUNTER — Encounter: Payer: Self-pay | Admitting: Internal Medicine

## 2020-06-19 ENCOUNTER — Other Ambulatory Visit: Payer: Self-pay

## 2020-06-19 ENCOUNTER — Ambulatory Visit: Payer: Medicare Other | Admitting: Internal Medicine

## 2020-06-19 VITALS — BP 120/70 | HR 70 | Temp 97.7°F | Ht 62.5 in | Wt 153.0 lb

## 2020-06-19 DIAGNOSIS — I10 Essential (primary) hypertension: Secondary | ICD-10-CM | POA: Diagnosis not present

## 2020-06-19 DIAGNOSIS — E1169 Type 2 diabetes mellitus with other specified complication: Secondary | ICD-10-CM | POA: Diagnosis not present

## 2020-06-19 DIAGNOSIS — Z8639 Personal history of other endocrine, nutritional and metabolic disease: Secondary | ICD-10-CM

## 2020-06-19 DIAGNOSIS — K580 Irritable bowel syndrome with diarrhea: Secondary | ICD-10-CM

## 2020-06-19 DIAGNOSIS — E785 Hyperlipidemia, unspecified: Secondary | ICD-10-CM

## 2020-06-19 DIAGNOSIS — J22 Unspecified acute lower respiratory infection: Secondary | ICD-10-CM

## 2020-06-19 DIAGNOSIS — E039 Hypothyroidism, unspecified: Secondary | ICD-10-CM | POA: Diagnosis not present

## 2020-06-19 NOTE — Progress Notes (Signed)
   Subjective:    Patient ID: Courtney Ryan, female    DOB: 05/20/1946, 74 y.o.   MRN: [id number]  HPI 74 year old Female seen today for follow-up of lower respiratory infection symptoms. Had negative COVID-19 testing. BNP was normal. CBC was normal. Respiratory virus panel was negative.  She was treated with tapering course of steroids and Levaquin and has improved.  She has a history of essential hypertension, impaired glucose tolerance and hyperlipidemia. History of parathyroid adenoma removed by Dr. Harlow Asa  Says pulse oximetry has been running between 94 and 98% at home   Review of Systems today she is complaining of diarrhea in the afternoons. This has been present for well over 2 or 3 years. Sounds like irritable bowel symptoms. I recommended probiotics. Given handout on irritable bowel syndrome.    Objective:   Physical Exam Blood pressure 120/70 pulse 70 temperature 97.7 degrees pulse oximetry 94% weight 153 pounds BMI 27.54 Neck is supple. No carotid bruits. Chest clear to auscultation. Cardiac exam regular rate and rhythm.      Assessment & Plan:  Acute lower respiratory infection-improved with Levaquin and steroids. Negative for COVID-19. Respiratory virus panel was also negative.  Diarrhea present for 2 or 3 years. 1 episode daily generally in the afternoons. Sometimes has significant fecal urgency.  Recommend probiotics. Irritable bowel handout given. We can treat with medication if she does not respond to probiotics. She lost her husband a few months ago. This has been stressful. She also moved to a new home. There is situational stress at this point.  Medicare annual wellness visit and health maintenance exam is due in December.

## 2020-06-19 NOTE — Patient Instructions (Signed)
May take probiotics for diarrhea. Irritable bowel handout given. If symptoms not improving we can try medication by prescription. I am glad you are better for respiratory infection. You tested negative for COVID-19 and your respiratory virus panel was negative. Your annual exam and Medicare wellness visit is due in December.

## 2020-06-24 ENCOUNTER — Telehealth: Payer: Self-pay | Admitting: Internal Medicine

## 2020-06-24 NOTE — Telephone Encounter (Signed)
LVM to call office to schedule CPE due after 09/22/2020

## 2020-07-08 ENCOUNTER — Other Ambulatory Visit: Payer: Self-pay

## 2020-07-08 ENCOUNTER — Encounter: Payer: Self-pay | Admitting: Internal Medicine

## 2020-07-08 ENCOUNTER — Ambulatory Visit (INDEPENDENT_AMBULATORY_CARE_PROVIDER_SITE_OTHER): Payer: Medicare Other | Admitting: Internal Medicine

## 2020-07-08 VITALS — BP 110/80 | HR 60 | Temp 97.8°F | Ht 62.5 in | Wt 153.0 lb

## 2020-07-08 DIAGNOSIS — Z23 Encounter for immunization: Secondary | ICD-10-CM | POA: Diagnosis not present

## 2020-07-08 NOTE — Patient Instructions (Signed)
Patient received a flu vaccine IM L deltoid, AV, CMA  

## 2020-07-09 NOTE — Progress Notes (Signed)
Flu Vaccine per CMA 

## 2020-09-06 ENCOUNTER — Other Ambulatory Visit: Payer: Self-pay | Admitting: Internal Medicine

## 2020-09-21 ENCOUNTER — Other Ambulatory Visit: Payer: Self-pay

## 2020-09-21 ENCOUNTER — Other Ambulatory Visit: Payer: Medicare Other | Admitting: Internal Medicine

## 2020-09-21 DIAGNOSIS — E039 Hypothyroidism, unspecified: Secondary | ICD-10-CM

## 2020-09-21 DIAGNOSIS — E559 Vitamin D deficiency, unspecified: Secondary | ICD-10-CM

## 2020-09-21 DIAGNOSIS — E1169 Type 2 diabetes mellitus with other specified complication: Secondary | ICD-10-CM

## 2020-09-21 DIAGNOSIS — Z0000 Encounter for general adult medical examination without abnormal findings: Secondary | ICD-10-CM

## 2020-09-21 DIAGNOSIS — E119 Type 2 diabetes mellitus without complications: Secondary | ICD-10-CM

## 2020-09-21 DIAGNOSIS — E213 Hyperparathyroidism, unspecified: Secondary | ICD-10-CM

## 2020-09-21 DIAGNOSIS — I10 Essential (primary) hypertension: Secondary | ICD-10-CM

## 2020-09-22 LAB — CBC WITH DIFFERENTIAL/PLATELET
Absolute Monocytes: 681 cells/uL (ref 200–950)
Basophils Absolute: 83 cells/uL (ref 0–200)
Basophils Relative: 0.9 %
Eosinophils Absolute: 92 cells/uL (ref 15–500)
Eosinophils Relative: 1 %
HCT: 44 % (ref 35.0–45.0)
Hemoglobin: 14.8 g/dL (ref 11.7–15.5)
Lymphs Abs: 2346 cells/uL (ref 850–3900)
MCH: 30 pg (ref 27.0–33.0)
MCHC: 33.6 g/dL (ref 32.0–36.0)
MCV: 89.2 fL (ref 80.0–100.0)
MPV: 9.4 fL (ref 7.5–12.5)
Monocytes Relative: 7.4 %
Neutro Abs: 5998 cells/uL (ref 1500–7800)
Neutrophils Relative %: 65.2 %
Platelets: 281 10*3/uL (ref 140–400)
RBC: 4.93 10*6/uL (ref 3.80–5.10)
RDW: 14.4 % (ref 11.0–15.0)
Total Lymphocyte: 25.5 %
WBC: 9.2 10*3/uL (ref 3.8–10.8)

## 2020-09-22 LAB — COMPLETE METABOLIC PANEL WITH GFR
AG Ratio: 1.5 (calc) (ref 1.0–2.5)
ALT: 16 U/L (ref 6–29)
AST: 14 U/L (ref 10–35)
Albumin: 4.4 g/dL (ref 3.6–5.1)
Alkaline phosphatase (APISO): 89 U/L (ref 37–153)
BUN/Creatinine Ratio: 21 (calc) (ref 6–22)
BUN: 22 mg/dL (ref 7–25)
CO2: 28 mmol/L (ref 20–32)
Calcium: 9.7 mg/dL (ref 8.6–10.4)
Chloride: 104 mmol/L (ref 98–110)
Creat: 1.03 mg/dL — ABNORMAL HIGH (ref 0.60–0.93)
GFR, Est African American: 61 mL/min/{1.73_m2} (ref >=60)
GFR, Est Non African American: 53 mL/min/{1.73_m2} — ABNORMAL LOW (ref >=60)
Globulin: 3 g/dL (calc) (ref 1.9–3.7)
Glucose, Bld: 100 mg/dL — ABNORMAL HIGH (ref 65–99)
Potassium: 4.4 mmol/L (ref 3.5–5.3)
Sodium: 141 mmol/L (ref 135–146)
Total Bilirubin: 0.4 mg/dL (ref 0.2–1.2)
Total Protein: 7.4 g/dL (ref 6.1–8.1)

## 2020-09-22 LAB — LIPID PANEL
Cholesterol: 182 mg/dL (ref <200)
HDL: 53 mg/dL (ref >=50)
LDL Cholesterol (Calc): 104 mg/dL (calc) — ABNORMAL HIGH
Non-HDL Cholesterol (Calc): 129 mg/dL (calc) (ref <130)
Total CHOL/HDL Ratio: 3.4 (calc) (ref <5.0)
Triglycerides: 150 mg/dL — ABNORMAL HIGH (ref <150)

## 2020-09-22 LAB — HEMOGLOBIN A1C
Hgb A1c MFr Bld: 6.7 % of total Hgb — ABNORMAL HIGH (ref <5.7)
Mean Plasma Glucose: 146 mg/dL
eAG (mmol/L): 8.1 mmol/L

## 2020-09-22 LAB — VITAMIN D 25 HYDROXY (VIT D DEFICIENCY, FRACTURES): Vit D, 25-Hydroxy: 28 ng/mL — ABNORMAL LOW (ref 30–100)

## 2020-09-22 LAB — TSH: TSH: 3.14 mIU/L (ref 0.40–4.50)

## 2020-09-28 ENCOUNTER — Ambulatory Visit (INDEPENDENT_AMBULATORY_CARE_PROVIDER_SITE_OTHER): Payer: Medicare Other | Admitting: Internal Medicine

## 2020-09-28 ENCOUNTER — Encounter: Payer: Self-pay | Admitting: Internal Medicine

## 2020-09-28 ENCOUNTER — Other Ambulatory Visit: Payer: Self-pay

## 2020-09-28 VITALS — BP 130/80 | HR 66 | Ht 62.5 in | Wt 158.0 lb

## 2020-09-28 DIAGNOSIS — I10 Essential (primary) hypertension: Secondary | ICD-10-CM | POA: Diagnosis not present

## 2020-09-28 DIAGNOSIS — E039 Hypothyroidism, unspecified: Secondary | ICD-10-CM | POA: Diagnosis not present

## 2020-09-28 DIAGNOSIS — K439 Ventral hernia without obstruction or gangrene: Secondary | ICD-10-CM

## 2020-09-28 DIAGNOSIS — Z8639 Personal history of other endocrine, nutritional and metabolic disease: Secondary | ICD-10-CM

## 2020-09-28 DIAGNOSIS — E1169 Type 2 diabetes mellitus with other specified complication: Secondary | ICD-10-CM | POA: Diagnosis not present

## 2020-09-28 DIAGNOSIS — Z0000 Encounter for general adult medical examination without abnormal findings: Secondary | ICD-10-CM | POA: Diagnosis not present

## 2020-09-28 DIAGNOSIS — E785 Hyperlipidemia, unspecified: Secondary | ICD-10-CM

## 2020-09-28 DIAGNOSIS — F411 Generalized anxiety disorder: Secondary | ICD-10-CM

## 2020-09-28 LAB — POCT URINALYSIS DIPSTICK
Appearance: NEGATIVE
Bilirubin, UA: NEGATIVE
Blood, UA: NEGATIVE
Glucose, UA: NEGATIVE
Ketones, UA: NEGATIVE
Leukocytes, UA: NEGATIVE
Nitrite, UA: NEGATIVE
Odor: NEGATIVE
Protein, UA: NEGATIVE
Spec Grav, UA: 1.01 (ref 1.010–1.025)
Urobilinogen, UA: 0.2 E.U./dL
pH, UA: 6.5 (ref 5.0–8.0)

## 2020-10-17 ENCOUNTER — Other Ambulatory Visit: Payer: Self-pay | Admitting: Internal Medicine

## 2020-10-27 ENCOUNTER — Ambulatory Visit: Payer: Self-pay

## 2020-10-27 ENCOUNTER — Ambulatory Visit: Payer: Medicare Other | Admitting: Orthopaedic Surgery

## 2020-10-27 ENCOUNTER — Encounter: Payer: Self-pay | Admitting: Orthopaedic Surgery

## 2020-10-27 VITALS — Ht 62.5 in | Wt 158.0 lb

## 2020-10-27 DIAGNOSIS — G8929 Other chronic pain: Secondary | ICD-10-CM | POA: Diagnosis not present

## 2020-10-27 DIAGNOSIS — M25562 Pain in left knee: Secondary | ICD-10-CM

## 2020-10-27 MED ORDER — METHYLPREDNISOLONE ACETATE 40 MG/ML IJ SUSP
40.0000 mg | INTRAMUSCULAR | Status: AC | PRN
  Administered 2020-10-27: 40 mg via INTRA_ARTICULAR

## 2020-10-27 MED ORDER — LIDOCAINE HCL 1 % IJ SOLN
5.0000 mL | INTRAMUSCULAR | Status: AC | PRN
  Administered 2020-10-27: 5 mL

## 2020-10-27 NOTE — Progress Notes (Signed)
Office Visit Note   Patient: Courtney Ryan           Date of Birth: 04/01/1946           MRN: [id number] Visit Date: 10/27/2020              Requested by: Elby Showers, MD [address] [place],  [place] [address] PCP: Elby Showers, MD   Assessment & Plan: Visit Diagnoses:  1. Chronic pain of left knee     Plan: We will see her back in 2 weeks see what type of response she had to the injection.  If she continues to have recurrent effusion or develops any mechanical symptoms recommend MRI to rule out meniscal tear.  Questions encouraged and answered.  Follow-Up Instructions: Return in about 2 weeks (around 11/10/2020).   Orders:  Orders Placed This Encounter  Procedures  . Large Joint Inj  . XR Knee 1-2 Views Left   No orders of the defined types were placed in this encounter.     Procedures: Large Joint Inj: L knee on 10/27/2020 10:42 AM Indications: pain Details: 22 G 1.5 in needle, anterolateral approach  Arthrogram: No  Medications: 40 mg methylPREDNISolone acetate 40 MG/ML; 5 mL lidocaine 1 % Aspirate: 10 mL yellow and blood-tinged Outcome: tolerated well, no immediate complications Procedure, treatment alternatives, risks and benefits explained, specific risks discussed. Consent was given by the patient. Immediately prior to procedure a time out was called to verify the correct patient, procedure, equipment, support staff and site/side marked as required. Patient was prepped and draped in the usual sterile fashion.       Clinical Data: No additional findings.   Subjective: Chief Complaint  Patient presents with  . Left Knee - Pain    HPI Courtney Ryan is a 75 year old female well-known to Dr. Creta Levin service comes in today with left knee pain for several months.  She states she has had knee pain on and off for years but now the knee pain is becoming constant over the last few months.  And even worse over the last couple weeks.  She denies any mechanical  symptoms.  Denies any known injury however she did have a fall on the ice just the other day.  Denies any swelling in the knee.  She has been taking aspirin for the pain.  She notes she had flu earlier this year.  Currently no fevers chills or any recent vaccines.  Review of Systems See HPI.  Objective: Vital Signs: Ht 5' 2.5" (1.588 m)   Wt 158 lb (71.7 kg)   BMI 28.44 kg/m   Physical Exam General: Well-developed well-nourished female no acute distress. Psych: Alert and oriented x3 Ortho Exam Left knee she has full extension full flexion.  Tenderness along medial lateral joint line.  No instability valgus varus stressing.  Full range of motion of the right knee without pain.  Bilateral knees without ecchymosis or erythema.  Slight effusion of the left knee though.  No rashes skin lesions about the left knee.  Right knee negative McMurray's.  Left knee positive McMurray's. Specialty Comments:  No specialty comments available.  Imaging: XR Knee 1-2 Views Left  Result Date: 10/27/2020 Left knee two views: No acute fracture.  No bony abnormalities.  Mild patellofemoral changes otherwise knee is well-preserved.  Slight effusion.    PMFS History: Patient Active Problem List   Diagnosis Date Noted  . Primary hyperparathyroidism ([facility]) 10/18/2018  . Trigger finger of right thumb 12/21/2016  .  Intramural and subserous leiomyoma of uterus 10/01/2016  . Deviated nasal septum 12/09/2015  . Sinusitis, chronic 11/29/2015  . Controlled diabetes mellitus type II without complication ([facility]) 03/70/4888  . Dependent edema 04/24/2014  . Hyperlipidemia 06/16/2011  . Hypothyroidism 06/16/2011  . Osteoarthritis 06/16/2011  . Goiter 06/16/2011  . Fibrocystic breast disease 06/16/2011  . Osteopenia 06/16/2011  . Vitamin D deficiency 06/16/2011  . Hypertension 06/16/2011  . Migraine headache 06/16/2011   Past Medical History:  Diagnosis Date  . Anxiety    takes xanax as needed  . Arthritis     bottom of spine and end of fingers  . Bursitis of both knees   . Bursitis of left hip   . COPD (chronic obstructive pulmonary disease) ([facility])    test done 15 years ago "had the beginnings of COPD"  . Depression   . Fibrocystic breast disease   . Goiter   . Hyperlipidemia   . Hypertension 2008   Dr. Renold Genta  . Osteopenia   . Polyp in anterior nares    just in Rt nostral  . Vitamin D deficiency     Family History  Problem Relation Age of Onset  . Cancer Mother   . Cancer Father     Past Surgical History:  Procedure Laterality Date  . OOPHORECTOMY    . OVARIAN CYST REMOVAL    . PARATHYROIDECTOMY N/A 03/07/2019   Procedure: RIGHT INFERIOR PARATHYROIDECTOMY;  Surgeon: [name], MD;  Location: [facility];  Service: General;  Laterality: N/A;   Social History   Occupational History  . Not on file  Tobacco Use  . Smoking status: Former Smoker    Packs/day: 0.40    Types: Cigarettes    Quit date: 10/03/2018    Years since quitting: 2.0  . Smokeless tobacco: Never Used  Vaping Use  . Vaping Use: Never used  Substance and Sexual Activity  . Alcohol use: Yes    Comment: occationally  . Drug use: Never  . Sexual activity: Not on file

## 2020-10-28 ENCOUNTER — Other Ambulatory Visit: Payer: Self-pay | Admitting: Internal Medicine

## 2020-11-01 NOTE — Progress Notes (Signed)
Subjective:    Patient ID: Courtney Ryan, female    DOB: [birth year], 75 y.o.   MRN: [id number]  HPI 75 year old Female seen today for health maintenance exam, evaluation of medical issues and Medicare wellness visit.  Patient has history of impaired glucose tolerance, hypertension, hyperlipidemia status post removal of parathyroid adenoma by Dr. Harlow Asa in June 2020.  History of hypothyroidism with goiter, dependent edema, fibrocystic breast disease, history of migraine headaches, osteoarthritis, osteopenia and vitamin D deficiency.  In March 2018 she saw Dr. Fredna Dow for trigger finger right thumb.  Saw Dr. Ninfa Linden in December 2017 for trochanteric bursitis of left hip.  X-ray was unremarkable.  History of irritable bowel syndrome.  History of bacterial vaginosis.  Past medical history: She had an ovary removed due to a cyst in 1968.  In the 1970s she had abnormal nevus removed from her left calf.  She had colonoscopy in 2006 in [place].  Right breast biopsy January 2009 which was benign.  In 2017 she was complaining of right groin and right lower quadrant abdominal pain.  CT of abdomen and pelvis were performed and she was found to have fatty liver and a notable ventral hernia containing only fat.  She had a left adrenal adenoma.  She had aortoiliac atherosclerosis.  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  No demonstrable mass or adenopathy in the abdomen.  Social history: She is a widow.  Husband died several months ago of esophageal cancer.  She smokes about 5 cigarettes daily and drinks a couple glasses of wine a week.  Has good friends and neighbors.  Family history: Father died at age 36 of lung cancer.  Mother died at age 33 with cancer of the liver in 84.      Review of Systems  Constitutional: Negative.   Respiratory: Negative.   Cardiovascular: Negative.   Gastrointestinal: Negative.   Genitourinary: Negative.   Neurological: Negative.   Psychiatric/Behavioral:  Negative.        Objective:   Physical Exam Vitals reviewed.  Constitutional:      Appearance: Normal appearance.  HENT:     Head: Normocephalic and atraumatic.     Right Ear: Tympanic membrane normal.     Left Ear: Tympanic membrane normal.     Nose: Nose normal.  Eyes:     General: No scleral icterus.       Right eye: No discharge.        Left eye: No discharge.     Extraocular Movements: Extraocular movements intact.     Pupils: Pupils are equal, round, and reactive to light.  Neck:     Vascular: No carotid bruit.     Comments: No thyromegaly. Cardiovascular:     Rate and Rhythm: Normal rate and regular rhythm.     Heart sounds: Normal heart sounds. No murmur heard.     Comments: Breast without masses. Pulmonary:     Effort: Pulmonary effort is normal.     Breath sounds: Normal breath sounds. No rales.  Abdominal:     General: There is no distension.     Palpations: Abdomen is soft. There is no mass.     Tenderness: There is no right CVA tenderness, left CVA tenderness, guarding or rebound.  Genitourinary:    Comments: Bimanual normal.  Pap deferred due to age. Musculoskeletal:     Cervical back: Neck supple.     Right lower leg: No edema.     Left lower leg: No edema.  Lymphadenopathy:  Cervical: No cervical adenopathy.  Skin:    General: Skin is warm and dry.  Neurological:     General: No focal deficit present.     Mental Status: She is alert and oriented to person, place, and time.     Cranial Nerves: No cranial nerve deficit.     Motor: No weakness.     Gait: Gait normal.  Psychiatric:        Mood and Affect: Mood normal.        Behavior: Behavior normal.        Thought Content: Thought content normal.        Judgment: Judgment normal.           Assessment & Plan:  Type 2 diabetes mellitus.  Hemoglobin A1c 6.7% does not want to be on glucose lowering medication  Hypothyroidism stable with levothyroxine 50 mcg daily  History of  hypertension treated with Tenormin and Norvasc as well as Lasix and stable  Hyperlipidemia-triglycerides are 150 and LDL cholesterol 104.  Asking patient to take Crestor 5 mg 3 times a week with supper.  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  Calcium level is normal.  History of ventral hernia on CT scan in 2017 asymptomatic  Plan: Continue current medications and follow-up here in 6 months.  Subjective:   Patient presents for Medicare Annual/Subsequent preventive examination.  Review Past Medical/Family/Social: See above   Risk Factors  Current exercise habits: Light exercise Dietary issues discussed: Low-fat low carbohydrate  Cardiac risk factors: Hyperlipidemia and glucose intolerance  Depression Screen  (Note: if answer to either of the following is "Yes", a more complete depression screening is indicated)   Over the past two weeks, have you felt down, depressed or hopeless? No  Over the past two weeks, have you felt little interest or pleasure in doing things? No Have you lost interest or pleasure in daily life? No Do you often feel hopeless? No Do you cry easily over simple problems? No   Activities of Daily Living  In your present state of health, do you have any difficulty performing the following activities?:   Driving? No  Managing money? No  Feeding yourself? No  Getting from bed to chair? No  Climbing a flight of stairs? No  Preparing food and eating?: No  Bathing or showering? No  Getting dressed: No  Getting to the toilet? No  Using the toilet:No  Moving around from place to place: No  In the past year have you fallen or had a near fall?:No  Are you sexually active? No  Do you have more than one partner? No   Hearing Difficulties: No  Do you often ask people to speak up or repeat themselves? No  Do you experience ringing or noises in your ears? No  Do you have difficulty  understanding soft or whispered voices? No  Do you feel that you have a problem with memory? No Do you often misplace items? No    Home Safety:  Do you have a smoke alarm at your residence? Yes Do you have grab bars in the bathroom? Do you have throw rugs in your house?   Cognitive Testing  Alert? Yes Normal Appearance?Yes  Oriented to person? Yes Place? Yes  Time? Yes  Recall of three objects? Yes  Can perform simple calculations? Yes  Displays appropriate judgment?Yes  Can read the correct time from a watch face?Yes  List the Names of Other Physician/Practitioners you currently use:  See referral list for the physicians patient is currently seeing.     Review of Systems: See above  Objective:     General appearance: Appears younger than stated age Head: Normocephalic, without obvious abnormality, atraumatic  Eyes: conj clear, EOMi PEERLA  Ears: normal TM's and external ear canals both ears  Nose: Nares normal. Septum midline. Mucosa normal. No drainage or sinus tenderness.  Throat: lips, mucosa, and tongue normal; teeth and gums normal  Neck: no adenopathy, no carotid bruit, no JVD, supple, symmetrical, trachea midline and thyroid not enlarged, symmetric, no tenderness/mass/nodules  No CVA tenderness.  Lungs: clear to auscultation bilaterally  Breasts: normal appearance, no masses or tenderness Heart: regular rate and rhythm, S1, S2 normal, no murmur, click, rub or gallop  Abdomen: soft, non-tender; bowel sounds normal; no masses, no organomegaly  Musculoskeletal: ROM normal in all joints, no crepitus, no deformity, Normal muscle strengthen. Back  is symmetric, no curvature. Skin: Skin color, texture, turgor normal. No rashes or lesions  Lymph nodes: Cervical, supraclavicular, and axillary nodes normal.  Neurologic: CN 2 -12 Normal, Normal symmetric reflexes. Normal coordination and gait  Psych: Alert & Oriented x 3, Mood appear stable.    Assessment:    Annual  wellness medicare exam   Plan:    During the course of the visit the patient was educated and counseled about appropriate screening and preventive services including:   Annual mammogram  Had colonoscopy in 2019  Immunizations are up-to-date   Patient Instructions (the written plan) was given to the patient.  Medicare Attestation  I have personally reviewed:  The patient's medical and social history  Their use of alcohol, tobacco or illicit drugs  Their current medications and supplements  The patient's functional ability including ADLs,fall risks, home safety risks, cognitive, and hearing and visual impairment  Diet and physical activities  Evidence for depression or mood disorders  The patient's weight, height, BMI, and visual acuity have been recorded in the chart. I have made referrals, counseling, and provided education to the patient based on review of the above and I have provided the patient with a written personalized care plan for preventive services.

## 2020-11-01 NOTE — Patient Instructions (Signed)
It was a pleasure to see you today.  Continue current medications and follow-up here in 6 months.  Watch diet particularly carbohydrates.  Continue light exercise.

## 2020-11-10 ENCOUNTER — Ambulatory Visit: Payer: Medicare Other | Admitting: Orthopaedic Surgery

## 2020-12-07 ENCOUNTER — Encounter: Payer: Self-pay | Admitting: Orthopaedic Surgery

## 2020-12-07 ENCOUNTER — Ambulatory Visit: Payer: Medicare Other | Admitting: Orthopaedic Surgery

## 2020-12-07 ENCOUNTER — Other Ambulatory Visit: Payer: Self-pay

## 2020-12-07 DIAGNOSIS — G8929 Other chronic pain: Secondary | ICD-10-CM

## 2020-12-07 DIAGNOSIS — M25562 Pain in left knee: Secondary | ICD-10-CM | POA: Diagnosis not present

## 2020-12-07 MED ORDER — MELOXICAM 15 MG PO TABS: 15.0000 mg | ORAL_TABLET | Freq: Every day | ORAL | 1 refills | Status: DC | PRN

## 2020-12-07 MED ORDER — METHYLPREDNISOLONE 4 MG PO TABS: ORAL_TABLET | ORAL | 0 refills | Status: DC

## 2020-12-07 NOTE — Progress Notes (Signed)
Patient is an active 75 year old female who comes in with continued left knee pain and swelling.  She was seen 2 weeks ago and had a bloody effusion drained from her knee and placed a steroid in the knee.  She says she got excellent relief for 10 days.  She says the pain is now again as bad as it was when she came in to see [place].  She is having enough pain in that knee that does warrant further treatment.  Examination of her left knee today does show again a mild effusion.  She has significant medial joint line tenderness and a positive McMurray exam to the medial compartment.  X-rays from last visit of the left knee showed well-maintained medial lateral compartments with no significant arthritic changes.  Given the failed conservative treatment combined with her positive Murray's exam, locking catching with left knee and a bloody effusion, a MRI of the left knee is warranted to rule out a meniscal tear and to assess the cartilage of her knee.  I am going to place her on a 6-day steroid taper and some meloxicam in the interim.  All question concerns were answered addressed.  She will call for follow-up appointment once she has the date of the MRI of her left knee.

## 2020-12-08 ENCOUNTER — Telehealth: Payer: Self-pay | Admitting: Orthopaedic Surgery

## 2020-12-08 NOTE — Telephone Encounter (Signed)
Pt called and she was prescribed medrol and I says to take as instructed but she was never told how many to take or for how long. Could you please call her and tell her how much to take.  Cb [phone]

## 2020-12-08 NOTE — Telephone Encounter (Signed)
Patient called requesting a call back. Pharmacy did not put on bottle what dosage and how much should be taken. Patient did not give the name of the prescription. Please call patient [phone].

## 2020-12-08 NOTE — Telephone Encounter (Signed)
Was this the one Artis Delay was telling you about yesterday

## 2020-12-08 NOTE — Telephone Encounter (Signed)
Called and advised pt.

## 2020-12-12 ENCOUNTER — Other Ambulatory Visit: Payer: Self-pay | Admitting: Internal Medicine

## 2020-12-14 ENCOUNTER — Telehealth: Payer: Self-pay | Admitting: Internal Medicine

## 2020-12-14 NOTE — Telephone Encounter (Signed)
Mishel Sans [phone]  [facility] called to say she is changing pharmacies. She wants all of her prescriptions to be filled at the below pharmacy only.  [id number] [address], [address] AT [facility] [address] Phone:  [phone]  Fax:  [phone]

## 2020-12-15 NOTE — Telephone Encounter (Signed)
Pharmacy was changed

## 2020-12-28 ENCOUNTER — Other Ambulatory Visit: Payer: Self-pay | Admitting: Internal Medicine

## 2020-12-28 ENCOUNTER — Ambulatory Visit
Admission: RE | Admit: 2020-12-28 | Discharge: 2020-12-28 | Disposition: A | Discharge status: Home / Self Care | Payer: Medicare Other | Source: Ambulatory Visit | Attending: Orthopaedic Surgery | Admitting: Orthopaedic Surgery

## 2020-12-28 DIAGNOSIS — M25562 Pain in left knee: Secondary | ICD-10-CM

## 2020-12-28 DIAGNOSIS — G8929 Other chronic pain: Secondary | ICD-10-CM

## 2020-12-30 ENCOUNTER — Ambulatory Visit: Payer: Medicare Other | Admitting: Orthopaedic Surgery

## 2020-12-30 ENCOUNTER — Encounter: Payer: Self-pay | Admitting: Orthopaedic Surgery

## 2020-12-30 DIAGNOSIS — M25562 Pain in left knee: Secondary | ICD-10-CM | POA: Diagnosis not present

## 2020-12-30 DIAGNOSIS — G8929 Other chronic pain: Secondary | ICD-10-CM | POA: Diagnosis not present

## 2020-12-30 NOTE — Progress Notes (Signed)
The patient comes in today to go over MRI of her left knee.  She is a very active 75 year old female has been hurting for several months on the medial aspect of her left knee especially with pivoting activities.  The steroid injection and some anti-inflammatories did help calm her pain down some but she still has pain in that area of her knee.  Her x-ray showed a very well-maintained knee joint and after the failed conservative treatment, we thought MRI was warranted of this left knee.  On exam there is no effusion of her knee with excellent range of motion.  She still does have medial joint line tenderness.  The MRI of her left knee is reviewed and it shows intact cartilage throughout the knee except for some mild arthritis of the patellofemoral joint.  The meniscus are intact as well as the collateral ligaments.  The ACL and PCL are also intact.  There is no irregularity seen on the medial compartment of the knee.  This did give her some reassurance that there is not a surgery is indicated for her knee.  I would recommend continued anti-inflammatories and even considering topical Voltaren gel for the medial side of her knee.  We can always place another steroid injection in her knee down the road if needed.  All question concerns were answered addressed.  She can still work on activity modification and quad exercises.

## 2021-01-01 LAB — HM DIABETES EYE EXAM

## 2021-03-08 ENCOUNTER — Other Ambulatory Visit: Payer: Self-pay | Admitting: Internal Medicine

## 2021-03-22 ENCOUNTER — Other Ambulatory Visit: Payer: Self-pay

## 2021-03-22 ENCOUNTER — Other Ambulatory Visit: Payer: Medicare Other | Admitting: Internal Medicine

## 2021-03-22 DIAGNOSIS — E039 Hypothyroidism, unspecified: Secondary | ICD-10-CM

## 2021-03-22 DIAGNOSIS — K580 Irritable bowel syndrome with diarrhea: Secondary | ICD-10-CM

## 2021-03-22 DIAGNOSIS — E785 Hyperlipidemia, unspecified: Secondary | ICD-10-CM

## 2021-03-22 DIAGNOSIS — I10 Essential (primary) hypertension: Secondary | ICD-10-CM

## 2021-03-23 LAB — COMPLETE METABOLIC PANEL WITH GFR
AG Ratio: 1.5 (calc) (ref 1.0–2.5)
ALT: 18 U/L (ref 6–29)
AST: 16 U/L (ref 10–35)
Albumin: 4.3 g/dL (ref 3.6–5.1)
Alkaline phosphatase (APISO): 87 U/L (ref 37–153)
BUN/Creatinine Ratio: 25 (calc) — ABNORMAL HIGH (ref 6–22)
BUN: 27 mg/dL — ABNORMAL HIGH (ref 7–25)
CO2: 29 mmol/L (ref 20–32)
Calcium: 9.6 mg/dL (ref 8.6–10.4)
Chloride: 105 mmol/L (ref 98–110)
Creat: 1.07 mg/dL — ABNORMAL HIGH (ref 0.60–0.93)
GFR, Est African American: 58 mL/min/{1.73_m2} — ABNORMAL LOW (ref >=60)
GFR, Est Non African American: 50 mL/min/{1.73_m2} — ABNORMAL LOW (ref >=60)
Globulin: 2.9 g/dL (calc) (ref 1.9–3.7)
Glucose, Bld: 103 mg/dL — ABNORMAL HIGH (ref 65–99)
Potassium: 4.2 mmol/L (ref 3.5–5.3)
Sodium: 142 mmol/L (ref 135–146)
Total Bilirubin: 0.4 mg/dL (ref 0.2–1.2)
Total Protein: 7.2 g/dL (ref 6.1–8.1)

## 2021-03-23 LAB — MICROALBUMIN / CREATININE URINE RATIO
Creatinine, Urine: 166 mg/dL (ref 20–275)
Microalb Creat Ratio: 8 mcg/mg creat (ref <30)
Microalb, Ur: 1.3 mg/dL

## 2021-03-23 LAB — LIPID PANEL
Cholesterol: 178 mg/dL (ref <200)
HDL: 46 mg/dL — ABNORMAL LOW (ref >=50)
LDL Cholesterol (Calc): 104 mg/dL (calc) — ABNORMAL HIGH
Non-HDL Cholesterol (Calc): 132 mg/dL (calc) — ABNORMAL HIGH (ref <130)
Total CHOL/HDL Ratio: 3.9 (calc) (ref <5.0)
Triglycerides: 168 mg/dL — ABNORMAL HIGH (ref <150)

## 2021-03-23 LAB — TSH: TSH: 3.21 mIU/L (ref 0.40–4.50)

## 2021-03-23 LAB — HEMOGLOBIN A1C
Hgb A1c MFr Bld: 6.6 % of total Hgb — ABNORMAL HIGH (ref <5.7)
Mean Plasma Glucose: 143 mg/dL
eAG (mmol/L): 7.9 mmol/L

## 2021-03-29 ENCOUNTER — Other Ambulatory Visit: Payer: Self-pay

## 2021-03-29 ENCOUNTER — Encounter: Payer: Self-pay | Admitting: Internal Medicine

## 2021-03-29 ENCOUNTER — Ambulatory Visit: Payer: Medicare Other | Admitting: Internal Medicine

## 2021-03-29 VITALS — BP 120/80 | HR 84 | Ht 62.5 in | Wt 155.0 lb

## 2021-03-29 DIAGNOSIS — E1169 Type 2 diabetes mellitus with other specified complication: Secondary | ICD-10-CM

## 2021-03-29 DIAGNOSIS — I10 Essential (primary) hypertension: Secondary | ICD-10-CM

## 2021-03-29 DIAGNOSIS — Z8639 Personal history of other endocrine, nutritional and metabolic disease: Secondary | ICD-10-CM

## 2021-03-29 DIAGNOSIS — E785 Hyperlipidemia, unspecified: Secondary | ICD-10-CM

## 2021-03-29 DIAGNOSIS — E039 Hypothyroidism, unspecified: Secondary | ICD-10-CM

## 2021-03-29 DIAGNOSIS — F411 Generalized anxiety disorder: Secondary | ICD-10-CM

## 2021-03-29 MED ORDER — LEVOTHYROXINE SODIUM 75 MCG PO TABS: ORAL_TABLET | ORAL | 1 refills | Status: DC

## 2021-03-29 NOTE — Progress Notes (Addendum)
   Subjective:    Patient ID: Courtney Ryan, female    DOB: [birth year], 75 y.o.   MRN: [id number]  HPI 75 year old Female seen today for 6 month appointment.  In March she had MRI of her left knee and was seen by Dr. Ninfa Linden.  Had steroid injection that helped along with anti-inflammatory medication.  She has intact cartilage throughout the knee except for some mild arthritis at the patellofemoral joint.  Menisci are intact as well as collateral ligaments.  Was advised to use topical Voltaren gel and continue anti-inflammatory oral medication.  Can always have another steroid injection if necessary.  History of impaired glucose tolerance, hypertension, hyperlipidemia, status post removal of parathyroid adenoma by Dr. Harlow Asa in June 2020.  History of hypothyroidism with goiter, dependent edema, fibrocystic breast disease, migraine headaches, osteoarthritis, osteopenia and vitamin D deficiency.  In March 2018 she saw Dr. Fredna Dow for trigger finger right thumb.  She saw Dr. Ninfa Linden in December 2017 for trochanteric bursitis of left hip and x-ray was unremarkable.  History of bacterial vaginosis and irritable bowel syndrome.  Social history: She is a widow.  She plays cards every Thursday.  This has been helpful during the pandemic and also after she lost her husband.  He passed away several months ago of complications of esophageal cancer.  Patient smokes about 5 cigarettes daily and drinks a couple glasses of wine a week.  Review of Systems see above     Objective:   Physical Exam Blood pressure excellent 120/80, pulse 84, pulse oximetry 96% weight 155 pounds BMI 27.90 height 5 feet 2.5 inches.  Skin: Warm and dry.  No thyromegaly or carotid bruits.  Chest clear to auscultation.  Cardiac exam: Regular rate and rhythm without ectopy or murmurs.  No lower extremity pitting edema.  Affect thought and judgment appear to be normal.       Assessment & Plan:  Type 2 diabetes mellitus with recent  hemoglobin A1c 6.6%.  This is treated with diet only and stable.  Has not wanted to be on medication for this.  This has been discussed previously.  Hypothyroidism treated with levothyroxine 75 mcg daily and stable  History of anxiety-treated with Xanax  Hypertension-stable on current regimen of Lasix and Tenormin and amlodipine  History of parathyroid adenoma removed by Dr. Harlow Asa   Hyperlipidemia treated with Crestor 3 times a week  Allergic rhinitis treated with Claritin  Recommend COVID booster  Plan: Return in 6 months for Medicare wellness and health maintenance exam.  She will continue with current medications.  She will try to get a bit more exercise and watch her diet.

## 2021-04-02 ENCOUNTER — Telehealth: Payer: Self-pay | Admitting: Internal Medicine

## 2021-04-02 NOTE — Telephone Encounter (Signed)
Let patient know I had contacted pharmacy about Thyroid replacement medication new dose

## 2021-04-20 ENCOUNTER — Ambulatory Visit: Payer: Medicare Other | Admitting: Internal Medicine

## 2021-04-20 ENCOUNTER — Telehealth: Payer: Self-pay | Admitting: Internal Medicine

## 2021-04-20 ENCOUNTER — Encounter: Payer: Self-pay | Admitting: Internal Medicine

## 2021-04-20 ENCOUNTER — Other Ambulatory Visit: Payer: Self-pay

## 2021-04-20 VITALS — BP 110/60 | HR 60 | Temp 98.0°F | Ht 62.5 in | Wt 153.0 lb

## 2021-04-20 DIAGNOSIS — H6502 Acute serous otitis media, left ear: Secondary | ICD-10-CM | POA: Diagnosis not present

## 2021-04-20 DIAGNOSIS — J029 Acute pharyngitis, unspecified: Secondary | ICD-10-CM

## 2021-04-20 MED ORDER — LEVOFLOXACIN 500 MG PO TABS: 500.0000 mg | ORAL_TABLET | Freq: Every day | ORAL | 0 refills | Status: AC

## 2021-04-20 MED ORDER — HYDROCODONE BIT-HOMATROP MBR 5-1.5 MG/5ML PO SOLN: 5.0000 mL | Freq: Three times a day (TID) | ORAL | 0 refills | Status: DC | PRN

## 2021-04-20 NOTE — Telephone Encounter (Signed)
Courtney Ryan called back to say she is negative, do you want to do office visit this afternoon? 2:45

## 2021-04-20 NOTE — Progress Notes (Signed)
   Subjective:    Patient ID: Courtney Ryan, female    DOB: 1946-08-06, 75 y.o.   MRN: [id number]  HPI 75 year old Female  seen with sore throat and left ear pain onset on Friday July 15 after playing cards on Thursday.  No one there was ill that she is aware of.  Has not touch base with anyone there to see if they are ill at the present time.  She has no fever, chills, nausea, vomiting, dysgeusia.  Records indicate she has had 3 COVID-19 immunizations the last 1 in October 2021.  She was advised to get a booster soon if she tests negative for COVID now.  She has a history of essential hypertension, impaired glucose tolerance and hyperlipidemia.  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  She is a widow and resides alone.  No travel history.  Main social outlet currently during the pandemic is playing cards on Thursday.    Review of Systems denies shortness of breath, nausea vomiting or diarrhea     Objective:   Physical Exam Blood pressure 110/60 pulse 60 temperature 98 degrees orally pulse oximetry 96% weight 153 pounds BMI 27.54  Skin: Warm and dry.  No cervical adenopathy.  Left TM slightly full.  It is not red.  Right TM clear.  Pharynx is clear.  Neck is supple.  Chest is clear to auscultation.       Assessment & Plan:  Left serous otitis media  Acute pharyngitis  Suspected upper respiratory infection  Plan: We talked about testing for COVID-19 with PCR test.  She is being treated today with Levaquin 500 mg daily for 7 days.  May take Hycodan 1 teaspoon by mouth every 8 hours as needed for cough or sore throat pain.  Rest and drink fluids.  Call if symptoms worsen.

## 2021-04-20 NOTE — Telephone Encounter (Signed)
Courtney Ryan [phone]  [name] called to see if you could call her in something for bad ear ache right ear and sore throat. She stated it has been going on since Friday and is very painful. She has had COVID vaccines and 1 booster. She is going to do a home COVID test and call back with results. I let her know we would need to do some type of visit before calling in any medication. Once we get COVID results would decide what type of visit.

## 2021-04-20 NOTE — Telephone Encounter (Signed)
scheduled

## 2021-04-23 ENCOUNTER — Encounter: Payer: Self-pay | Admitting: Internal Medicine

## 2021-04-23 NOTE — Patient Instructions (Signed)
It was a pleasure to see you today.  Sorry you are not feeling well.  Take Levaquin 500 mg daily for 7 days.  May take Hycodan 1 teaspoon every 8 hours as needed for cough and sore throat pain.  Stay well-hydrated.  Call if symptoms worsen.

## 2021-05-02 NOTE — Patient Instructions (Addendum)
Continue with current medications as previously prescribed for glucose intolerance, hypothyroidism, anxiety, hypertension and hyperlipidemia.  Watch diet.  Try to get a bit more exercise.  Follow-up in 6 months for health maintenance exam and Medicare wellness visit.  Recommend COVID booster.

## 2021-05-20 IMAGING — US US THYROID
1 series · 14 of 25 positions shown · non-contrast
Comparison: 09/04/2007

CLINICAL DATA: Prior ultrasound follow-up.  Follow-up nodule.

EXAM:
THYROID ULTRASOUND
TECHNIQUE: Ultrasound examination of the thyroid gland and adjacent soft
tissues was performed.

[Series 1: us thyroid · 0.06mm/px · 14 of 42 slices shown]
[im 1/42]
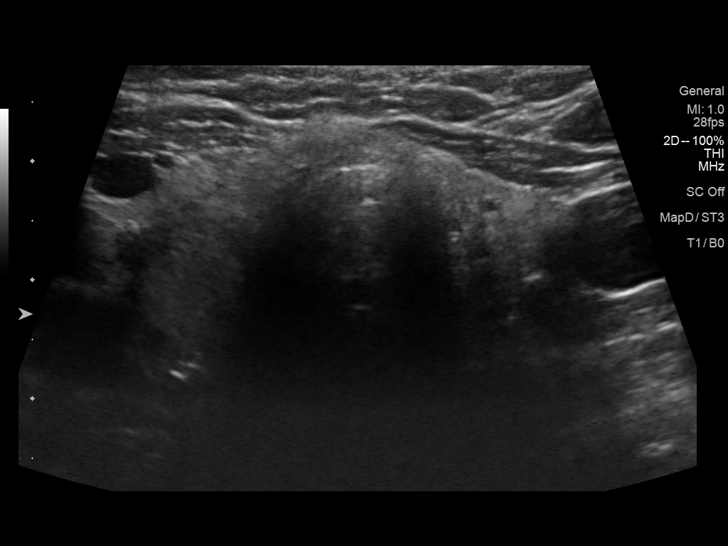
[im 4/42]
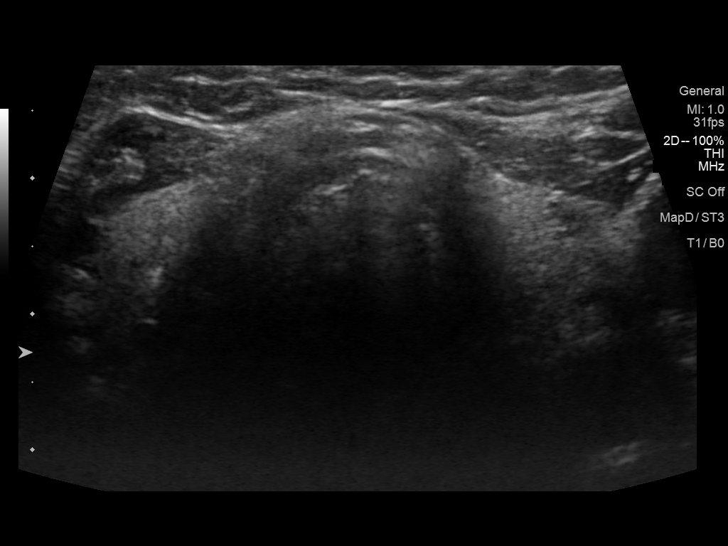
[im 7/42]
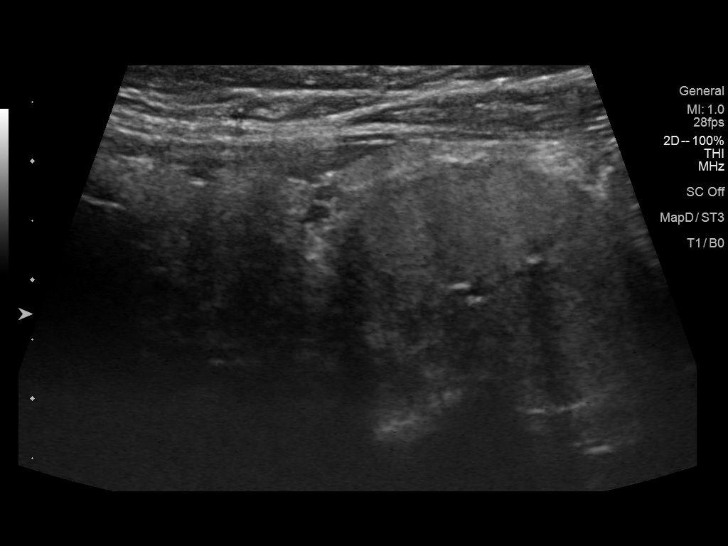
[im 11/42]
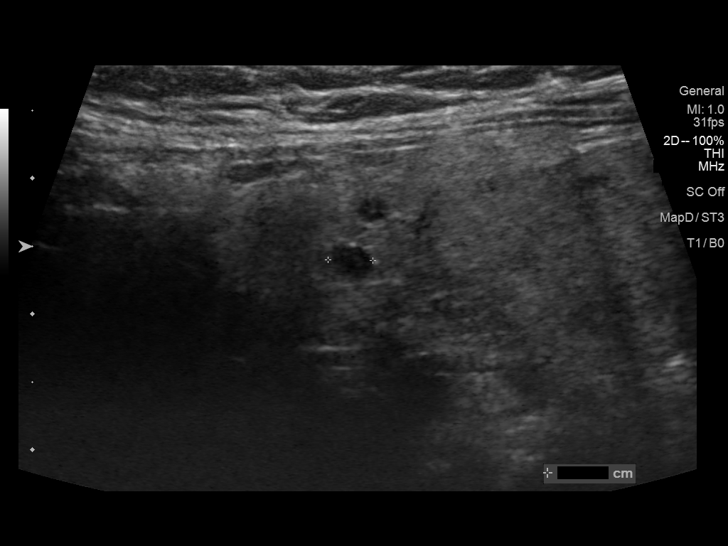
[im 14/42]
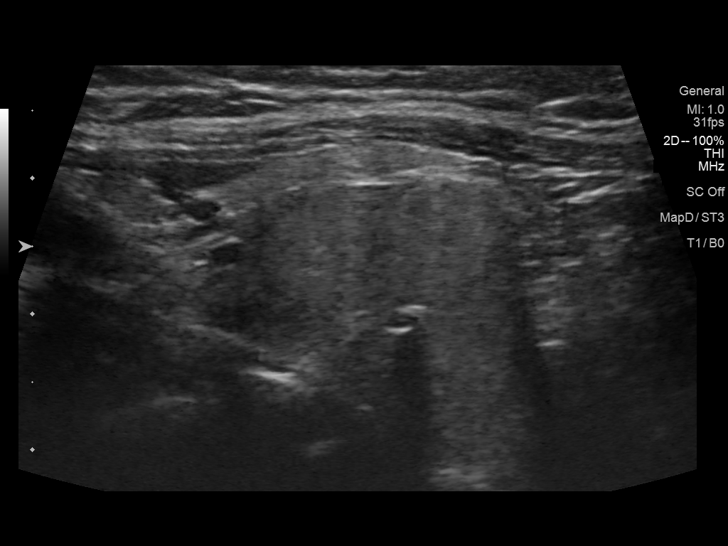
[im 16/42]
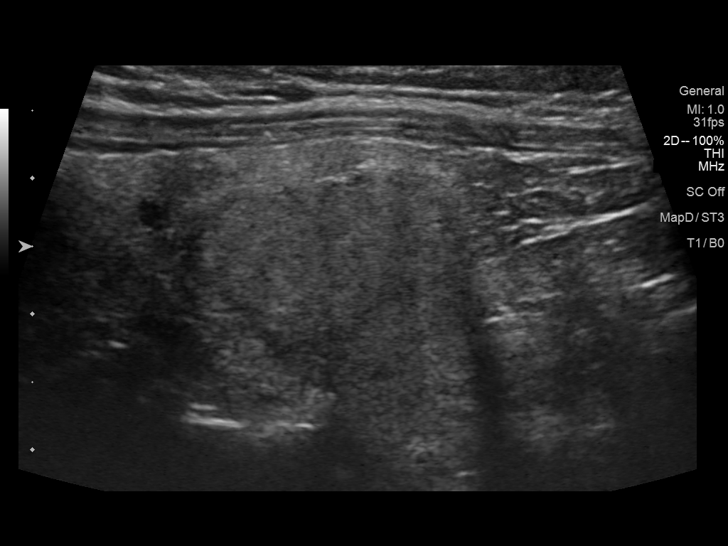
[im 19/42]
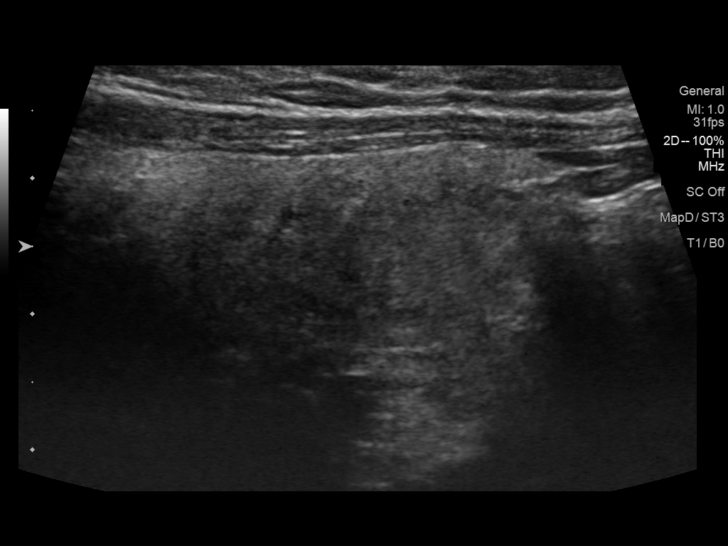
[im 23/42]
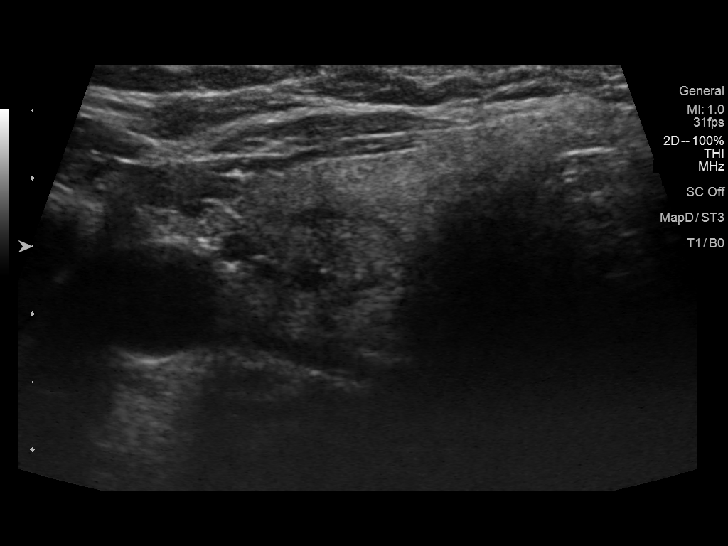
[im 26/42]
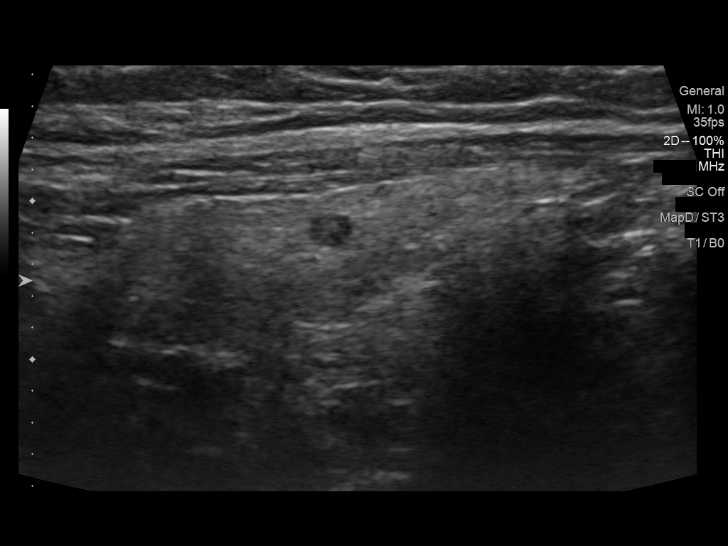
[im 28/42]
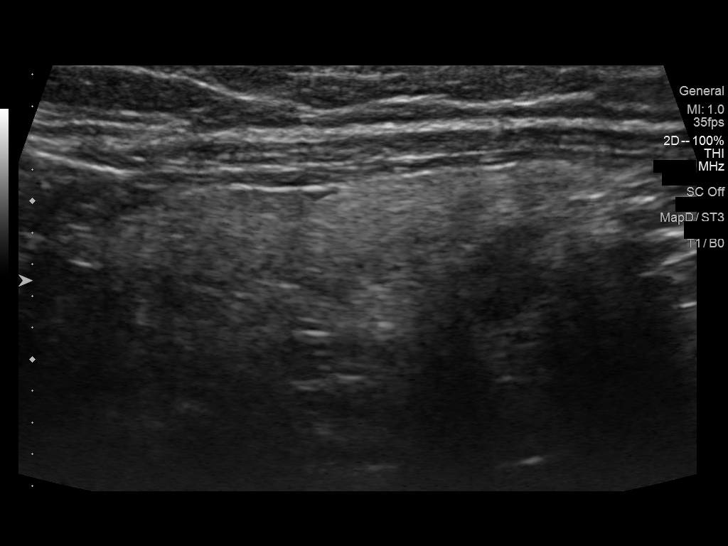
[im 31/42]
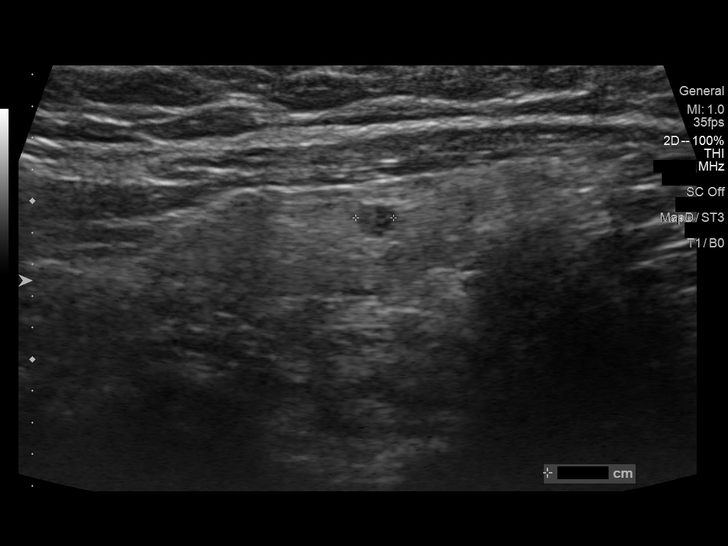
[im 35/42]
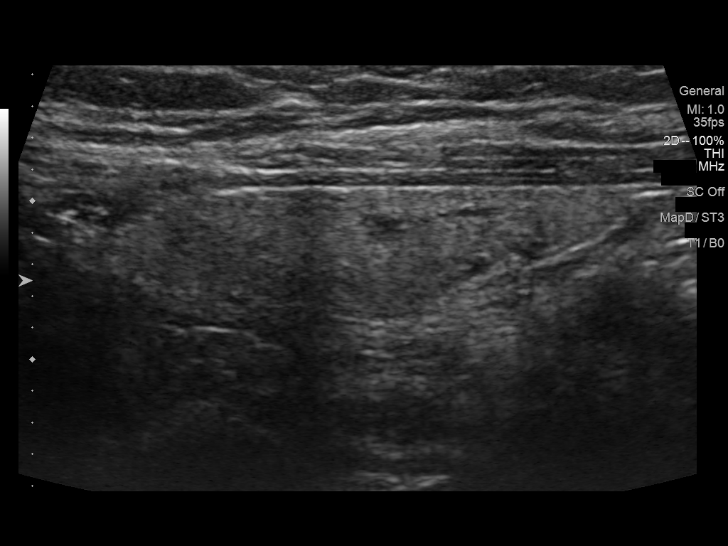
[im 38/42]
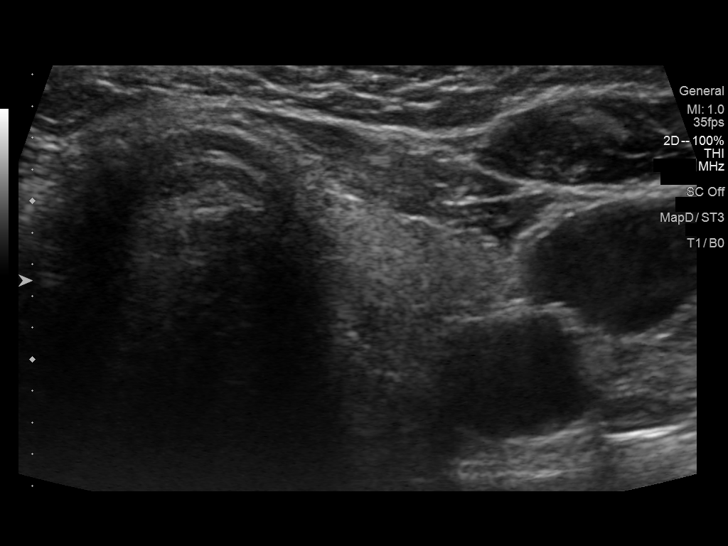
[im 42/42]
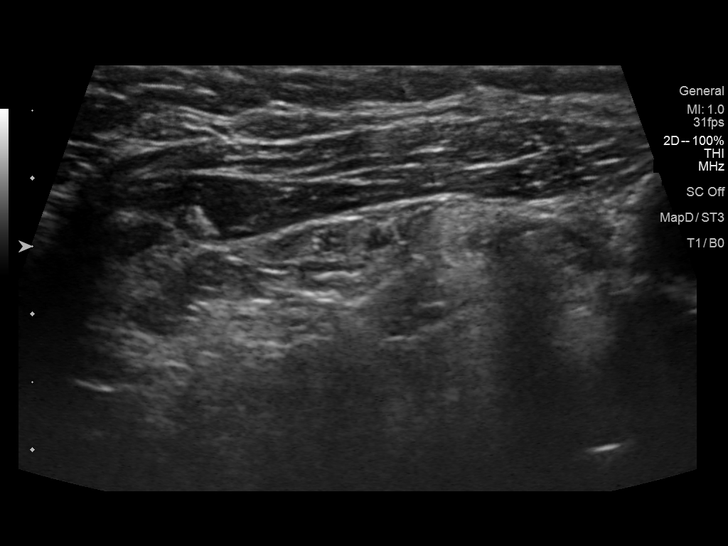

[14 of 25 positions shown; findings below may reference images not displayed]

FINDINGS: Parenchymal Echotexture: Mildly heterogenous

Isthmus: 0.3 cm, previously 1.6 cm

Right lobe: 4.9 x 2.1 x 1.6 cm, previously 4.5 x 1.8 x 1.5 cm

Left lobe: 3.5 x 1.0 x 1.2 cm, previously 3.6 x 0.8 x 1.1 cm

_________________________________________________________

Estimated total number of nodules >/= 1 cm: 1

Number of spongiform nodules >/=  2 cm not described below (TR1): 0

Number of mixed cystic and solid nodules >/= 1.5 cm not described
below (TR2): 0

_________________________________________________________

Right lower pole nodule 1 measures 2.7 x 2.0 x 1.5 cm and previously
measured 3.0 x 1.8 x 1.5 cm. Biopsy was performed 09/12/2007.
IMPRESSION: Right nodule 1 is stable for more than 10 years supporting benign
etiology. This was biopsied previously, on 09/12/2007.

The above is in keeping with the ACR TI-RADS recommendations - [HOSPITAL] 8529;[DATE].

## 2021-05-21 ENCOUNTER — Other Ambulatory Visit: Payer: Self-pay | Admitting: Internal Medicine

## 2021-06-09 ENCOUNTER — Encounter: Payer: Self-pay | Admitting: Internal Medicine

## 2021-06-09 LAB — HM COLONOSCOPY

## 2021-06-20 ENCOUNTER — Other Ambulatory Visit: Payer: Self-pay | Admitting: Internal Medicine

## 2021-06-21 ENCOUNTER — Other Ambulatory Visit: Payer: Self-pay | Admitting: Internal Medicine

## 2021-06-28 ENCOUNTER — Other Ambulatory Visit: Payer: Self-pay | Admitting: Internal Medicine

## 2021-07-02 ENCOUNTER — Other Ambulatory Visit: Payer: Self-pay | Admitting: Internal Medicine

## 2021-07-07 ENCOUNTER — Telehealth: Payer: Self-pay | Admitting: Internal Medicine

## 2021-07-07 NOTE — Telephone Encounter (Signed)
scheduled

## 2021-07-07 NOTE — Telephone Encounter (Addendum)
Kameisha Malicki [phone]  Raynell has called with left ear pain. No other symptoms.

## 2021-07-08 ENCOUNTER — Ambulatory Visit: Payer: Medicare Other | Admitting: Internal Medicine

## 2021-07-08 ENCOUNTER — Other Ambulatory Visit: Payer: Self-pay

## 2021-07-08 ENCOUNTER — Encounter: Payer: Self-pay | Admitting: Internal Medicine

## 2021-07-08 VITALS — BP 140/74 | HR 61 | Temp 98.0°F | Ht 62.5 in | Wt 154.0 lb

## 2021-07-08 DIAGNOSIS — J22 Unspecified acute lower respiratory infection: Secondary | ICD-10-CM

## 2021-07-08 DIAGNOSIS — H6505 Acute serous otitis media, recurrent, left ear: Secondary | ICD-10-CM

## 2021-07-08 MED ORDER — AZITHROMYCIN 250 MG PO TABS: ORAL_TABLET | ORAL | 0 refills | Status: DC

## 2021-07-08 NOTE — Progress Notes (Signed)
   Subjective:    Patient ID: [name], female    DOB: 1945-12-17, 75 y.o.   MRN: [id number]  HPI 75 year old Female seen left ear pain.  No fever, chills, headache, sore throat, dysgeusia.  Was seen in July with acute serous otitis media of left ear treated with Levaquin.  Plays cards with some friends but does not think she has COVID-19.  Last COVID booster was October 2021.  History of osteoarthritis of left knee, impaired glucose tolerance, hypertension, hyperlipidemia, history of parathyroid adenoma removed by Dr. Harlow Asa in June 2020, hypothyroidism with goiter, dependent edema, migraine headaches, fibrocystic breast disease, osteoarthritis, osteopenia, vitamin D deficiency.  Review of Systems complains of cough with light green sputum     Objective:   Physical Exam She is afebrile.  Vital signs reviewed.  Left TM is full but not red.  Right TM clear.  Pharynx is clear.  Neck is supple without adenopathy or thyromegaly.  Chest is clear to auscultation.       Assessment & Plan:  Left serous otitis media  Fever  Acute lower respiratory infection with cough and discolored sputum  Plan: Zithromax Z-PAK take 2 tabs day 1 followed by 1 tab days 2 through 5.  Rest and drink fluids.

## 2021-07-30 NOTE — Patient Instructions (Signed)
Take Zithromax Z-PAK 2 tabs day 1 followed by 1 tab days 2 through 5.  Rest and drink fluids.

## 2021-08-03 ENCOUNTER — Telehealth: Payer: Self-pay | Admitting: Internal Medicine

## 2021-08-03 ENCOUNTER — Telehealth (INDEPENDENT_AMBULATORY_CARE_PROVIDER_SITE_OTHER): Payer: Medicare Other | Admitting: Internal Medicine

## 2021-08-03 ENCOUNTER — Other Ambulatory Visit: Payer: Self-pay

## 2021-08-03 VITALS — Temp 97.8°F | Ht 62.5 in | Wt 158.0 lb

## 2021-08-03 DIAGNOSIS — U071 COVID-19: Secondary | ICD-10-CM | POA: Diagnosis not present

## 2021-08-03 MED ORDER — AZITHROMYCIN 250 MG PO TABS: ORAL_TABLET | ORAL | 0 refills | Status: AC

## 2021-08-03 MED ORDER — BENZONATATE 100 MG PO CAPS: 100.0000 mg | ORAL_CAPSULE | Freq: Three times a day (TID) | ORAL | 0 refills | Status: DC | PRN

## 2021-08-03 NOTE — Telephone Encounter (Signed)
Courtney Ryan [phone]  [name] called to say she tested positive for COVID on Sunday and today, she has runny nose, cough, sore throat.

## 2021-08-03 NOTE — Telephone Encounter (Signed)
scheduled

## 2021-08-06 ENCOUNTER — Telehealth: Payer: Self-pay | Admitting: Internal Medicine

## 2021-08-06 NOTE — Telephone Encounter (Signed)
[place] results to [facility] [phone], phone [phone]  +COVID 08/01/2021

## 2021-08-13 ENCOUNTER — Other Ambulatory Visit: Payer: Self-pay

## 2021-08-13 ENCOUNTER — Telehealth (INDEPENDENT_AMBULATORY_CARE_PROVIDER_SITE_OTHER): Payer: Medicare Other | Admitting: Internal Medicine

## 2021-08-13 ENCOUNTER — Encounter: Payer: Self-pay | Admitting: Internal Medicine

## 2021-08-13 VITALS — Temp 98.8°F

## 2021-08-13 DIAGNOSIS — J22 Unspecified acute lower respiratory infection: Secondary | ICD-10-CM | POA: Diagnosis not present

## 2021-08-13 DIAGNOSIS — E1169 Type 2 diabetes mellitus with other specified complication: Secondary | ICD-10-CM | POA: Diagnosis not present

## 2021-08-13 DIAGNOSIS — E785 Hyperlipidemia, unspecified: Secondary | ICD-10-CM | POA: Diagnosis not present

## 2021-08-13 DIAGNOSIS — Z8616 Personal history of COVID-19: Secondary | ICD-10-CM | POA: Diagnosis not present

## 2021-08-13 MED ORDER — METHYLPREDNISOLONE 4 MG PO TABS: ORAL_TABLET | ORAL | 0 refills | Status: DC

## 2021-08-13 MED ORDER — LEVOFLOXACIN 500 MG PO TABS: 500.0000 mg | ORAL_TABLET | Freq: Every day | ORAL | 0 refills | Status: AC

## 2021-08-13 NOTE — Telephone Encounter (Signed)
Courtney Ryan called to say she is still testing positive for COVID, she had gotten a little better and now seems to be getting worse again with cough, sore throat, body aches, tired and ears plugged up

## 2021-08-13 NOTE — Progress Notes (Signed)
   Subjective:    Patient ID: [name], female    DOB: [birth year], 75 y.o.   MRN: [id number]  HPI 75 year old Female seen today via interactive audio and video telecommunications due to the Coronavirus  pandemic.  She is identified using 2 identifiers as Rochella Benner, a patient in this practice.  She is at her home and I am at my office.  She is agreeable to visit in this format today.  Patient called today complaining of protracted cough.  She continues to test positive for COVID-19.  She called on November 1 to say that she had tested positive for COVID-19 on October 30.  She was complaining of runny nose, cough and sore throat.  She was seen virtually on November 1.  She was treated with Tessalon Perles and a Zithromax Z-PAK.  Patient says cough is productive.  Slight discoloration of sputum.  She says she is currently afebrile.  Has no nausea, no vomiting.  Still has dysgeusia.  She has been going outside to take her dogs out a couple of times a day but has not been doing a lot of walking.  Denies shortness of breath.  Has fatigue.  She has a history of hyperlipidemia, hypertension, impaired glucose tolerance, history of parathyroid adenoma removed by Dr. Harlow Asa in June 2020.  She is a widow and resides alone.  Review of Systems no diarrhea, nausea, vomiting or headache.     Objective:   Physical Exam She is seen virtually in no acute distress.  Does not appear to be tachypneic but looks a little pale and fatigued.       Assessment & Plan:  History of COVID-19 infection diagnosed by home COVID test on October 30.  Was seen virtually on November 1 and is still testing positive which is not unusual.  She was reassured about that.  However she still has cough which is productive and that is concerning to her.  No fever or shaking chills.  Very slight discoloration to sputum.  Does not appear to be short of breath on video visit.  I do think she needs to walk outside when the weather is  suitable to get her strength back.  Previously was treated with Zithromax Z-PAK.  I will call in for her today Levaquin 500 mg daily for 7 days which she has taken before and done well with.  We will also prescribe Medrol 4 mg 6-day Dosepak to take for protracted cough and respiratory congestion.  If not improving in 7 to 10 days, we will see her in the office and get chest x-ray.  Encouraged her to walk more when weather is suitable outside to get her strength back and to monitor pulse oximetry.  She does have Tessalon Perles as needed for cough.  Continue to stay well-hydrated which she has been doing.  Time spent reviewing medical record, interviewing patient on video visit, medical decision making and E scribing medications is 20 minutes.

## 2021-08-13 NOTE — Patient Instructions (Signed)
Take Levaquin 500 mg daily for 7 days.  Take Medrol Dosepak in tapering course as directed.  Stay well-hydrated.  When the weather is suitable, try walking outside a bit more than just taking the dogs out twice daily.  Call if not better in 7 to 10 days or sooner if worse.

## 2021-08-16 ENCOUNTER — Encounter: Payer: Self-pay | Admitting: Internal Medicine

## 2021-08-16 NOTE — Patient Instructions (Signed)
We are sorry you are not feeling well.  Monitor your pulse oximetry and stay well-hydrated.  Take Zithromax Z-PAK 2 tabs day 1 followed by 1 tab days 2 through 5.  Call if symptoms worsen or not improving in a couple of days.  Walk some around  your home to keep lungs open.

## 2021-08-16 NOTE — Progress Notes (Signed)
   Subjective:    Patient ID: Courtney Ryan, female    DOB: 1946/09/29, 75 y.o.   MRN: [id number]  HPI 75 year old Female seen today by interactive audio and video connection due to the coronavirus pandemic.  Patient is agreeable to visit in this format today.  She is identified using 2 identifiers as Courtney Ryan, a patient in this practice.  She is at her home and I am at my office.  Patient called to say she was having runny nose, cough, sore throat myalgias onset on October 28.  Has been taking aspirin without little relief.  Tested positive for COVID-19 on home test on October 30.  Patient has a history of impaired glucose tolerance, hypertension, hyperlipidemia, status post removal of parathyroid adenoma by Dr. Harlow Asa in June 2020.  History of hypothyroidism with goiter, dependent edema, fibrocystic breast disease, history of migraine headaches, osteoarthritis, osteopenia and vitamin D deficiency.  History of trochanteric bursitis of the left hip in December 31, 2015.  History of irritable bowel syndrome.  She is a widow and resides alone.  Husband died in 2019-12-31 of esophageal cancer.  She smokes about 5 cigarettes daily and drinks a couple of glasses of wine a week.  Enjoys playing mah-jongg with friends.    Review of Systems she is not sure where she was exposed to [place] but may be with the group that plays mah-jongg.  Our records indicates she has had at least 3 COVID vaccines the last one on file in our records was October 2021.     Objective:   Physical Exam  She is seen virtually in no acute distress.  Does not appear to be tachypneic.  Is able to give a clear concise history but looks fatigued and slightly pale.      Assessment & Plan:  Acute COVID-19 virus infection  Plan: Zithromax Z-PAK take 2 tabs day 1 followed by 1 tab days 2 through 5.  Rest and drink plenty of fluids.  Call if symptoms worsen.  Monitor pulse oximetry.  Time spent on this visit is 20 minutes including chart review,  interviewing patient, medical decision making and E- scribing medication.

## 2021-08-17 ENCOUNTER — Other Ambulatory Visit: Payer: Self-pay | Admitting: Internal Medicine

## 2021-09-14 ENCOUNTER — Other Ambulatory Visit: Payer: Self-pay | Admitting: Internal Medicine

## 2021-09-28 ENCOUNTER — Other Ambulatory Visit: Payer: Medicare Other | Admitting: Internal Medicine

## 2021-09-28 ENCOUNTER — Other Ambulatory Visit: Payer: Self-pay

## 2021-09-28 DIAGNOSIS — E119 Type 2 diabetes mellitus without complications: Secondary | ICD-10-CM

## 2021-09-28 DIAGNOSIS — I10 Essential (primary) hypertension: Secondary | ICD-10-CM

## 2021-09-28 DIAGNOSIS — E785 Hyperlipidemia, unspecified: Secondary | ICD-10-CM

## 2021-09-28 DIAGNOSIS — E039 Hypothyroidism, unspecified: Secondary | ICD-10-CM

## 2021-09-29 LAB — MICROALBUMIN / CREATININE URINE RATIO
Creatinine, Urine: 149 mg/dL (ref 20–275)
Microalb Creat Ratio: 7 mcg/mg creat (ref <30)
Microalb, Ur: 1.1 mg/dL

## 2021-09-29 LAB — CBC WITH DIFFERENTIAL/PLATELET
Absolute Monocytes: 574 cells/uL (ref 200–950)
Basophils Absolute: 82 cells/uL (ref 0–200)
Basophils Relative: 1 %
Eosinophils Absolute: 98 cells/uL (ref 15–500)
Eosinophils Relative: 1.2 %
HCT: 41.2 % (ref 35.0–45.0)
Hemoglobin: 14 g/dL (ref 11.7–15.5)
Lymphs Abs: 2419 cells/uL (ref 850–3900)
MCH: 29.7 pg (ref 27.0–33.0)
MCHC: 34 g/dL (ref 32.0–36.0)
MCV: 87.5 fL (ref 80.0–100.0)
MPV: 9.6 fL (ref 7.5–12.5)
Monocytes Relative: 7 %
Neutro Abs: 5027 cells/uL (ref 1500–7800)
Neutrophils Relative %: 61.3 %
Platelets: 277 10*3/uL (ref 140–400)
RBC: 4.71 10*6/uL (ref 3.80–5.10)
RDW: 14.7 % (ref 11.0–15.0)
Total Lymphocyte: 29.5 %
WBC: 8.2 10*3/uL (ref 3.8–10.8)

## 2021-09-29 LAB — COMPLETE METABOLIC PANEL WITH GFR
AG Ratio: 1.5 (calc) (ref 1.0–2.5)
ALT: 16 U/L (ref 6–29)
AST: 16 U/L (ref 10–35)
Albumin: 4.1 g/dL (ref 3.6–5.1)
Alkaline phosphatase (APISO): 83 U/L (ref 37–153)
BUN: 18 mg/dL (ref 7–25)
CO2: 28 mmol/L (ref 20–32)
Calcium: 9.2 mg/dL (ref 8.6–10.4)
Chloride: 106 mmol/L (ref 98–110)
Creat: 0.91 mg/dL (ref 0.60–1.00)
Globulin: 2.7 g/dL (calc) (ref 1.9–3.7)
Glucose, Bld: 98 mg/dL (ref 65–99)
Potassium: 4.3 mmol/L (ref 3.5–5.3)
Sodium: 142 mmol/L (ref 135–146)
Total Bilirubin: 0.3 mg/dL (ref 0.2–1.2)
Total Protein: 6.8 g/dL (ref 6.1–8.1)
eGFR: 65 mL/min/{1.73_m2} (ref >=60)

## 2021-09-29 LAB — LIPID PANEL
Cholesterol: 167 mg/dL (ref <200)
HDL: 47 mg/dL — ABNORMAL LOW (ref >=50)
LDL Cholesterol (Calc): 93 mg/dL (calc)
Non-HDL Cholesterol (Calc): 120 mg/dL (calc) (ref <130)
Total CHOL/HDL Ratio: 3.6 (calc) (ref <5.0)
Triglycerides: 177 mg/dL — ABNORMAL HIGH (ref <150)

## 2021-09-29 LAB — HEMOGLOBIN A1C
Hgb A1c MFr Bld: 6.8 % of total Hgb — ABNORMAL HIGH (ref <5.7)
Mean Plasma Glucose: 148 mg/dL
eAG (mmol/L): 8.2 mmol/L

## 2021-09-29 LAB — TSH: TSH: 0.71 mIU/L (ref 0.40–4.50)

## 2021-09-30 ENCOUNTER — Encounter: Payer: Self-pay | Admitting: Internal Medicine

## 2021-09-30 ENCOUNTER — Ambulatory Visit (INDEPENDENT_AMBULATORY_CARE_PROVIDER_SITE_OTHER): Payer: Medicare Other | Admitting: Internal Medicine

## 2021-09-30 ENCOUNTER — Other Ambulatory Visit: Payer: Self-pay

## 2021-09-30 VITALS — BP 118/58 | HR 63 | Temp 97.8°F | Ht 62.75 in | Wt 157.0 lb

## 2021-09-30 DIAGNOSIS — J22 Unspecified acute lower respiratory infection: Secondary | ICD-10-CM

## 2021-09-30 DIAGNOSIS — E039 Hypothyroidism, unspecified: Secondary | ICD-10-CM

## 2021-09-30 DIAGNOSIS — R059 Cough, unspecified: Secondary | ICD-10-CM | POA: Diagnosis not present

## 2021-09-30 DIAGNOSIS — Z23 Encounter for immunization: Secondary | ICD-10-CM | POA: Diagnosis not present

## 2021-09-30 DIAGNOSIS — E785 Hyperlipidemia, unspecified: Secondary | ICD-10-CM

## 2021-09-30 DIAGNOSIS — Z8616 Personal history of COVID-19: Secondary | ICD-10-CM | POA: Diagnosis not present

## 2021-09-30 DIAGNOSIS — Z0000 Encounter for general adult medical examination without abnormal findings: Secondary | ICD-10-CM

## 2021-09-30 DIAGNOSIS — I10 Essential (primary) hypertension: Secondary | ICD-10-CM

## 2021-09-30 DIAGNOSIS — E1169 Type 2 diabetes mellitus with other specified complication: Secondary | ICD-10-CM

## 2021-09-30 LAB — POCT INFLUENZA A/B
Influenza A, POC: NEGATIVE
Influenza B, POC: NEGATIVE

## 2021-09-30 LAB — POC COVID19 BINAXNOW: SARS Coronavirus 2 Ag: POSITIVE — AB

## 2021-09-30 MED ORDER — BENZONATATE 100 MG PO CAPS: 100.0000 mg | ORAL_CAPSULE | Freq: Three times a day (TID) | ORAL | 0 refills | Status: DC | PRN

## 2021-09-30 MED ORDER — LEVOFLOXACIN 500 MG PO TABS: 500.0000 mg | ORAL_TABLET | Freq: Every day | ORAL | 0 refills | Status: AC

## 2021-09-30 NOTE — Progress Notes (Deleted)
IElby Showers, MD, have reviewed all documentation for this visit. The documentation on 09/30/21 for the exam, diagnosis, procedures, and orders are all accurate and complete.

## 2021-09-30 NOTE — Progress Notes (Signed)
° ° ° °Annual Wellness Visit ° °  ° °Patient: Courtney Ryan, Female    DOB: 05/24/1946, 75 y.o.   MRN: [id number] °Visit Date: 09/30/2021 ° °Chief Complaint  °Patient presents with  ° Medicare Wellness  ° °Subjective  °  °Courtney Ryan is a 75 y.o. female who presents today for her Annual Wellness Visit. ° °HPI She also presents for annual health maintenance exam but is ill today with a respiratory infection. She played cards almost a week ago with friends and could have been exposed to a respiratory pathogen. Has cough and congestion. Has been prescribed Levaquin and Tessalon perles and is awaiting results of PCR testing. Rapid flu test was negative and Covid Rapid test was equivocal. ° °Had acute lower respiratory infection in October. Had earache with sore throat in July. ° ° °Social History  ° °Social History Narrative  ° Social history: She is a widow.  Husband died several months ago of esophageal cancer.  She smokes about 5 cigarettes daily and drinks a couple glasses of wine a week.  Has good friends and neighbors.  °    ° Family history: Father died at age 89 of lung cancer.  Mother died at age 50 with cancer of the liver in 1963  ° ° °Patient Care Team: °Baxley, Mary J, MD as PCP - General (Internal Medicine) ° °Review of Systems-has nasal congestion malaise and fatigue ° ° Objective  °  °Vitals: There were no vitals taken for this visit. ° °Physical Exam neck is supple.  Chest clear to auscultation.  Cardiac exam: Regular rate and rhythm.  TMs are slightly full.  Pharynx slightly injected without exudate.  No lower extremity pitting edema. ° ° °Most recent functional status assessment: °In your present state of health, do you have any difficulty performing the following activities: 09/30/2021  °Hearing? N  °Vision? N  °Difficulty concentrating or making decisions? N  °Walking or climbing stairs? N  °Dressing or bathing? N  °Doing errands, shopping? N  °Preparing Food and eating ? N  °Using the Toilet? N  °In  the past six months, have you accidently leaked urine? N  °Do you have problems with loss of bowel control? Y  °Comment IBS  °Managing your Medications? N  °Managing your Finances? N  °Housekeeping or managing your Housekeeping? N  °Some recent data might be hidden  ° °Most recent fall risk assessment: °Fall Risk  09/30/2021  °Falls in the past year? 0  °Number falls in past yr: 0  °Injury with Fall? 0  °Risk for fall due to : No Fall Risks  °Follow up Falls evaluation completed  ° ° Most recent depression screenings: °PHQ 2/9 Scores 09/30/2021 09/28/2020  °PHQ - 2 Score 4 2  °PHQ- 9 Score 4 2  ° °Most recent cognitive screening: °6CIT Screen 09/30/2021  °What Year? 0 points  °What month? 0 points  °What time? 0 points  °Count back from 20 0 points  °Months in reverse 0 points  °Repeat phrase 0 points  °Total Score 0  ° ° ° ° ° Assessment & Plan  °  ° °Annual wellness visit done today including the all of the following: °Reviewed patient's Family Medical History °Reviewed and updated list of patient's medical providers °Assessment of cognitive impairment was done °Assessed patient's functional ability °Established a written schedule for health screening services °Health Risk Assessent Completed and Reviewed ° °Discussed health benefits of physical activity, and encouraged her to engage in regular exercise   appropriate for her age and condition.  °  ° °COVID PCR testing is pending.  She was started on Levaquin 500 mg daily for 7 days.  Take Tessalon Perles 100 mg up to 3 times daily as needed for cough.  Stay well-hydrated.  Monitor pulse oximetry.  Rest and call if symptoms worsen. ° °Her labs were reviewed today including hemoglobin A1c which is 6.8%, CBC is normal, c-Met is normal and triglycerides are elevated at 177.  Total cholesterol is 167 and LDL cholesterol 93. ° °Flu vaccine declined because she is ill today °In-house COVID testing is weakly positive.  PCR test is pending.  Rapid flu test is  negative. ° °{I, Mary J Baxley, MD, have reviewed all documentation for this visit. The documentation ond complete.n 09/30/21 for the exam, diagnosis, procedures, and orders are all accurate. ° ° °Arielle N Mathews, CMA  °

## 2021-09-30 NOTE — Patient Instructions (Addendum)
Just had Covid booster. Has lower respiratory infection today. Tested for Covid and influenza A.  Rapid flu test was negative.  COVID PCR test has been sent to lab.  Has had Covid booster but not flu vaccine.  Flu vaccine NOT given today because she is ill.  Take Levaquin 500 mg daily for 7 days and Tessalon Perles 100 mg up to 3 times daily as needed for cough.

## 2021-10-01 LAB — SARS-COV-2 RNA,(COVID-19) QUALITATIVE NAAT: SARS CoV2 RNA: NOT DETECTED

## 2021-11-01 ENCOUNTER — Other Ambulatory Visit: Payer: Self-pay | Admitting: Internal Medicine

## 2021-11-12 ENCOUNTER — Other Ambulatory Visit: Payer: Self-pay | Admitting: Internal Medicine

## 2021-11-12 DIAGNOSIS — M8580 Other specified disorders of bone density and structure, unspecified site: Secondary | ICD-10-CM

## 2021-11-12 DIAGNOSIS — Z0000 Encounter for general adult medical examination without abnormal findings: Secondary | ICD-10-CM

## 2021-11-12 DIAGNOSIS — Z780 Asymptomatic menopausal state: Secondary | ICD-10-CM

## 2021-12-19 ENCOUNTER — Other Ambulatory Visit: Payer: Self-pay | Admitting: Internal Medicine

## 2021-12-23 ENCOUNTER — Telehealth: Payer: Self-pay

## 2021-12-23 NOTE — Telephone Encounter (Signed)
Tried for PA on name brand Synthroid. She has only eve tried levothyroxine and didn't feel good while taking it.  ?

## 2022-01-02 ENCOUNTER — Other Ambulatory Visit: Payer: Self-pay | Admitting: Internal Medicine

## 2022-01-31 DIAGNOSIS — C801 Malignant (primary) neoplasm, unspecified: Secondary | ICD-10-CM

## 2022-01-31 HISTORY — DX: Malignant (primary) neoplasm, unspecified: C80.1

## 2022-02-21 LAB — HM DIABETES EYE EXAM

## 2022-03-03 HISTORY — PX: MOHS SURGERY: SUR867

## 2022-03-21 ENCOUNTER — Other Ambulatory Visit: Payer: Medicare Other

## 2022-03-21 DIAGNOSIS — E039 Hypothyroidism, unspecified: Secondary | ICD-10-CM

## 2022-03-21 DIAGNOSIS — E1169 Type 2 diabetes mellitus with other specified complication: Secondary | ICD-10-CM

## 2022-03-21 DIAGNOSIS — E119 Type 2 diabetes mellitus without complications: Secondary | ICD-10-CM

## 2022-03-24 ENCOUNTER — Encounter: Payer: Self-pay | Admitting: Internal Medicine

## 2022-03-24 ENCOUNTER — Ambulatory Visit: Payer: Medicare Other | Admitting: Internal Medicine

## 2022-03-24 VITALS — BP 128/66 | HR 65 | Temp 97.4°F | Ht 62.75 in | Wt 156.8 lb

## 2022-03-24 DIAGNOSIS — F411 Generalized anxiety disorder: Secondary | ICD-10-CM

## 2022-03-24 DIAGNOSIS — E785 Hyperlipidemia, unspecified: Secondary | ICD-10-CM

## 2022-03-24 DIAGNOSIS — I10 Essential (primary) hypertension: Secondary | ICD-10-CM | POA: Diagnosis not present

## 2022-03-24 DIAGNOSIS — Z23 Encounter for immunization: Secondary | ICD-10-CM

## 2022-03-24 DIAGNOSIS — E039 Hypothyroidism, unspecified: Secondary | ICD-10-CM | POA: Diagnosis not present

## 2022-03-24 DIAGNOSIS — E1169 Type 2 diabetes mellitus with other specified complication: Secondary | ICD-10-CM | POA: Diagnosis not present

## 2022-03-24 MED ORDER — METFORMIN HCL 500 MG PO TABS: 500.0000 mg | ORAL_TABLET | Freq: Two times a day (BID) | ORAL | 3 refills | Status: DC

## 2022-03-24 MED ORDER — GABAPENTIN 300 MG PO CAPS: 300.0000 mg | ORAL_CAPSULE | Freq: Two times a day (BID) | ORAL | 1 refills | Status: DC

## 2022-03-24 NOTE — Progress Notes (Signed)
   Subjective:    Patient ID: Courtney Ryan, female    DOB: 1946/08/26, 76 y.o.   MRN: [id number]  HPI Here for follow up. Had Moh's surgery recently for [facility] at [place] between eyebrows and has another one left preauricular facial area.  Has had Covid-19 x 2 and suggested booster this Fall.Discussed tetanus vaccine. Discussed Pneumococcal 20 vaccine.  Immunizations reviewed. Recommend Shingrix, Covid booster and Tdap. Prevnar 20 vaccine recommended.  Continue Rosuvaststain 3 times a week.  She has mixed hyperlipidemia.  She has type 2 diabetes mellitus with hemoglobin A1c 6.7%.  Vitamin B12 is over replaced at 1133 and she needs to cut back on her supplement.  Mammogram and bone density ordered for July  She has elevated serum creatinine of 1.41-likely was not well-hydrated when she came in.  Liver functions are normal.  Colonoscopy up to date done Sept 2022  TSH checked and stable. Continue same dose of thyroid replacement  BP stable on current antihypertensive meds   Review of Systems see above-I think she misses her husband.  She does try to do things with friends but is not quite the same.     Objective:   Physical Exam  Bp 128/66 pulse 65 Pulse ox 96% Weight 156 pounds, BMI 27.99  Neck is supple without JVD thyromegaly and carotid bruits.  Chest clear.  Cardiac exam regular rate and rhythm.  No lower extremity pitting edema.     Assessment & Plan:  Need for Prevnar 20 vaccine-this was given today  Elevated serum creatinine 1.41-I doubt she was well-hydrated when she had these labs drawn fasting.  Consider repeating at next visit when she is better hydrated.  B12 is over replaced at 1133-watch amount of B12 she is taking  Lipid panel and liver functions are stable  Blood pressure is stable-continue Tenormin, amlodipine, and Lasix  Continue Crestor 5 mg 3 times a week  TSH is within normal limits.  Type 2 diabetes mellitus-diet controlled and  hemoglobin A1c is 6.7%  Plan: Follow-up in September.  Prevnar 20 vaccine given today.  Cut back a bit on vitamin D supplement since it appears to be a bit high.  Continue rosuvastatin 3 times a week.

## 2022-03-25 ENCOUNTER — Other Ambulatory Visit: Payer: Self-pay | Admitting: Internal Medicine

## 2022-03-25 LAB — LIPID PANEL
Cholesterol: 175 mg/dL (ref <200)
HDL: 47 mg/dL — ABNORMAL LOW (ref >=50)
LDL Cholesterol (Calc): 102 mg/dL (calc) — ABNORMAL HIGH
Non-HDL Cholesterol (Calc): 128 mg/dL (calc) (ref <130)
Total CHOL/HDL Ratio: 3.7 (calc) (ref <5.0)
Triglycerides: 154 mg/dL — ABNORMAL HIGH (ref <150)

## 2022-03-25 LAB — HEPATIC FUNCTION PANEL
AG Ratio: 1.4 (calc) (ref 1.0–2.5)
ALT: 13 U/L (ref 6–29)
AST: 11 U/L (ref 10–35)
Albumin: 4.2 g/dL (ref 3.6–5.1)
Alkaline phosphatase (APISO): 116 U/L (ref 37–153)
Bilirubin, Direct: 0 mg/dL (ref 0.0–0.2)
Globulin: 3 g/dL (calc) (ref 1.9–3.7)
Indirect Bilirubin: 0.4 mg/dL (calc) (ref 0.2–1.2)
Total Bilirubin: 0.4 mg/dL (ref 0.2–1.2)
Total Protein: 7.2 g/dL (ref 6.1–8.1)

## 2022-03-25 LAB — VITAMIN B12: Vitamin B-12: 1133 pg/mL — ABNORMAL HIGH (ref 200–1100)

## 2022-03-25 LAB — HEMOGLOBIN A1C
Hgb A1c MFr Bld: 6.7 % of total Hgb — ABNORMAL HIGH (ref <5.7)
Mean Plasma Glucose: 146 mg/dL
eAG (mmol/L): 8.1 mmol/L

## 2022-03-25 LAB — TSH: TSH: 2.59 mIU/L (ref 0.40–4.50)

## 2022-03-29 ENCOUNTER — Telehealth: Payer: Self-pay

## 2022-03-29 NOTE — Telephone Encounter (Signed)
Patient is paying for the Synthroid out of pocket. She does not want to change medications and PA was denied. She wanted to let you know that the gabapentin is giving her diarrhea. She wanted to know if this is something that stops after some time or if she needs a different medication.

## 2022-04-07 ENCOUNTER — Telehealth: Payer: Self-pay

## 2022-04-07 ENCOUNTER — Ambulatory Visit: Admission: RE | Admit: 2022-04-07 | Payer: Medicare Other | Source: Ambulatory Visit

## 2022-04-07 ENCOUNTER — Ambulatory Visit
Admission: RE | Admit: 2022-04-07 | Discharge: 2022-04-07 | Disposition: A | Discharge status: Home / Self Care | Payer: Medicare Other | Source: Ambulatory Visit | Attending: Internal Medicine | Admitting: Internal Medicine

## 2022-04-07 NOTE — Telephone Encounter (Signed)
Patient states that she has stopped the gabapentin and the metformin. She stopped the gabapentin last week and the metformin 3 days ago. She states that she felt nauseous, had diarrhea and had a low blood sugar attack( felt cold and shaky). She did not check her blood sugar and doesn't know what it was then or now, but she has had no other episodes since stopping medications. She feels like they are too strong for her.

## 2022-04-07 NOTE — Telephone Encounter (Signed)
Left message for patient to return call to office at [phone].  

## 2022-04-08 ENCOUNTER — Other Ambulatory Visit: Payer: Self-pay | Admitting: Internal Medicine

## 2022-04-08 DIAGNOSIS — N6320 Unspecified lump in the left breast, unspecified quadrant: Secondary | ICD-10-CM

## 2022-04-08 NOTE — Telephone Encounter (Signed)
Results have been relayed to the patient. The patient verbalized understanding. No questions at this time.  Appt has been made for Monday.

## 2022-04-11 ENCOUNTER — Ambulatory Visit: Payer: Medicare Other | Admitting: Internal Medicine

## 2022-04-11 ENCOUNTER — Telehealth: Payer: Self-pay | Admitting: Internal Medicine

## 2022-04-11 ENCOUNTER — Ambulatory Visit
Admission: RE | Admit: 2022-04-11 | Discharge: 2022-04-11 | Disposition: A | Discharge status: Home / Self Care | Payer: Medicare Other | Source: Ambulatory Visit | Attending: Internal Medicine | Admitting: Internal Medicine

## 2022-04-11 VITALS — BP 112/60 | HR 70 | Temp 98.1°F

## 2022-04-11 DIAGNOSIS — R1013 Epigastric pain: Secondary | ICD-10-CM

## 2022-04-11 DIAGNOSIS — R509 Fever, unspecified: Secondary | ICD-10-CM

## 2022-04-11 DIAGNOSIS — Z1383 Encounter for screening for respiratory disorder NEC: Secondary | ICD-10-CM

## 2022-04-11 DIAGNOSIS — R059 Cough, unspecified: Secondary | ICD-10-CM | POA: Diagnosis not present

## 2022-04-11 DIAGNOSIS — R82998 Other abnormal findings in urine: Secondary | ICD-10-CM

## 2022-04-11 DIAGNOSIS — R109 Unspecified abdominal pain: Secondary | ICD-10-CM | POA: Diagnosis not present

## 2022-04-11 LAB — POC COVID19 BINAXNOW: SARS Coronavirus 2 Ag: NEGATIVE

## 2022-04-11 MED ORDER — AZITHROMYCIN 250 MG PO TABS: ORAL_TABLET | ORAL | 0 refills | Status: DC

## 2022-04-11 NOTE — Telephone Encounter (Signed)
I called her at 5:40 pm to give her results of Xrays. She will perform urine specimen and bring to office within one hour tomorrow. MJB, MD

## 2022-04-11 NOTE — Progress Notes (Signed)
Subjective:    Patient ID: Courtney Ryan, female    DOB: [birth year], 76 y.o.   MRN: [id number]  HPI 75 year old Female called saying she had developed cough, temperature of 100.6 degrees, some chest discomfort, anorexia and nausea with symptoms having started on Thursday, July 6.  She said that the COVID test she took was negative.  She was here on June 22 for 75-monthfollow-up appointment.  At that time hemoglobin A1c was 6.7%, B12 level was high at 1133, lipid panel showed improved triglycerides at 154 compared to 177 in December 2022.  LDL was 102 and total cholesterol was normal at 175.  TSH was normal at 2.59.  She is on levothyroxine for hypothyroidism.  She has mild hypertension treated with amlodipine and Tenormin.  In April 2023, she had been prescribed Xanax twice daily as needed for mild anxiety 0.25 mg.  In June, I convinced her to try metformin 500 mg twice daily for hemoglobin A1c of 6.7%.  She was also prescribed gabapentin 300 mg twice daily to help with restless legs.  [facility] sessile serrated adenoma and tubular adenoma.  No high-grade dysplasia noted.  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  He has had no recent travel and has not been around anyone ill that she is aware of.  She is a widow.  She seems a bit lonely.  Was playing cards with friends back in the winter but recently has been spending more time alone.   She had a bone density study done on July 6 showing a T score forearm -1.7 consistent with osteopenia.  Today her white blood cell count is elevated at 13,400.  Her hemoglobin is 15.1 g.  She generally runs a hemoglobin around 14.0.  She  has small ketones in her urinalysis and urine specific gravity was 1.020.  She had small LE on urine dipstick.  Culture was sent and showed mixed genital flora.  Respiratory virus panel was obtained which proved to be negative.  [facility]  Respiratory virus panel was obtained and proved to be negative.  It was thought perhaps she could have a viral syndrome.  She felt some of the medications that cause the symptoms.  I do not think the medications would cause a fever nor cough.    Review of Systems see aOlga Millersis also complaining of some vague epigastric abdominal pain and obstipation.  We sent her for a KUB and it showed a normal bowel gas pattern.  Chest x-ray was negative today.     Objective:   Physical Exam Vital signs were reviewed.  Blood pressure 112/60 pulse 70 temperature 98.1 degrees pulse oximetry 94% on room air Skin: Warm and dry.  No cervical adenopathy.  No thyromegaly.  Pharynx is very slightly injected without exudate.  TMs clear.  Neck is supple.  Chest is clear to auscultation without rales or wheezing.  Abdomen is slightly tender in the epigastrium without rebound tenderness.  No lower extremity edema.  Her affect is slightly anxious.      Assessment & Plan:  ?  Viral syndrome  ?  Intolerant of gabapentin- however that would not cause an elevated temperature.  Elevated temperature with suggest  some type of infection either bacterial or viral.  Plan: Conservative care and watchful waiting.    She will stay hydrated and call me if symptoms worsen.  She will follow-up here in person on July 14.  Started on Zithromax Z-PAK for respiratory symptoms.  Addendum: Chest x-ray shows no pneumonia, abdominal film shows no obstruction.  White blood cell count is 13,400 on July 10.  Etiology of this elevation of white blood cell count is not clear to me at this time.  She could have a viral syndrome.  She will follow-up here on July 14.   She will call sooner if symptoms worsen.  We should have more lab information at that time.  She will start on the Zithromax Z-PAK.

## 2022-04-11 NOTE — Telephone Encounter (Signed)
Courtney Ryan [phone]  [name] called to say she has cough, 100.6, chest hurts, not eating, nausea, started Thursday night, COVID test negative, her appointment today was about a medication reaction.

## 2022-04-12 LAB — CBC WITH DIFFERENTIAL/PLATELET
Absolute Monocytes: 1554 cells/uL — ABNORMAL HIGH (ref 200–950)
Basophils Absolute: 67 cells/uL (ref 0–200)
Basophils Relative: 0.5 %
Eosinophils Absolute: 27 cells/uL (ref 15–500)
Eosinophils Relative: 0.2 %
HCT: 43.9 % (ref 35.0–45.0)
Hemoglobin: 15.1 g/dL (ref 11.7–15.5)
Lymphs Abs: 1648 cells/uL (ref 850–3900)
MCH: 29.9 pg (ref 27.0–33.0)
MCHC: 34.4 g/dL (ref 32.0–36.0)
MCV: 86.9 fL (ref 80.0–100.0)
MPV: 9.6 fL (ref 7.5–12.5)
Monocytes Relative: 11.6 %
Neutro Abs: 10104 cells/uL — ABNORMAL HIGH (ref 1500–7800)
Neutrophils Relative %: 75.4 %
Platelets: 347 10*3/uL (ref 140–400)
RBC: 5.05 10*6/uL (ref 3.80–5.10)
RDW: 14.1 % (ref 11.0–15.0)
Total Lymphocyte: 12.3 %
WBC: 13.4 10*3/uL — ABNORMAL HIGH (ref 3.8–10.8)

## 2022-04-12 LAB — COMPLETE METABOLIC PANEL WITH GFR
AG Ratio: 1.1 (calc) (ref 1.0–2.5)
ALT: 19 U/L (ref 6–29)
AST: 17 U/L (ref 10–35)
Albumin: 4.1 g/dL (ref 3.6–5.1)
Alkaline phosphatase (APISO): 133 U/L (ref 37–153)
BUN/Creatinine Ratio: 18 (calc) (ref 6–22)
BUN: 25 mg/dL (ref 7–25)
CO2: 26 mmol/L (ref 20–32)
Calcium: 9.9 mg/dL (ref 8.6–10.4)
Chloride: 98 mmol/L (ref 98–110)
Creat: 1.41 mg/dL — ABNORMAL HIGH (ref 0.60–1.00)
Globulin: 3.7 g/dL (calc) (ref 1.9–3.7)
Glucose, Bld: 86 mg/dL (ref 65–99)
Potassium: 4.4 mmol/L (ref 3.5–5.3)
Sodium: 137 mmol/L (ref 135–146)
Total Bilirubin: 0.7 mg/dL (ref 0.2–1.2)
Total Protein: 7.8 g/dL (ref 6.1–8.1)
eGFR: 38 mL/min/{1.73_m2} — ABNORMAL LOW (ref >=60)

## 2022-04-12 LAB — POCT URINALYSIS DIPSTICK
Bilirubin, UA: NEGATIVE
Blood, UA: NEGATIVE
Glucose, UA: NEGATIVE
Nitrite, UA: NEGATIVE
Protein, UA: POSITIVE — AB
Spec Grav, UA: 1.02 (ref 1.010–1.025)
Urobilinogen, UA: 0.2 E.U./dL
pH, UA: 6 (ref 5.0–8.0)

## 2022-04-13 LAB — RESPIRATORY VIRUS PANEL

## 2022-04-14 LAB — URINE CULTURE
MICRO NUMBER:: 13630987
SPECIMEN QUALITY:: ADEQUATE

## 2022-04-15 ENCOUNTER — Ambulatory Visit: Payer: Medicare Other | Admitting: Internal Medicine

## 2022-04-15 ENCOUNTER — Encounter: Payer: Self-pay | Admitting: Internal Medicine

## 2022-04-15 VITALS — BP 122/64 | HR 70 | Temp 97.5°F

## 2022-04-15 DIAGNOSIS — D72829 Elevated white blood cell count, unspecified: Secondary | ICD-10-CM

## 2022-04-15 DIAGNOSIS — R7989 Other specified abnormal findings of blood chemistry: Secondary | ICD-10-CM

## 2022-04-15 DIAGNOSIS — E869 Volume depletion, unspecified: Secondary | ICD-10-CM

## 2022-04-15 DIAGNOSIS — B349 Viral infection, unspecified: Secondary | ICD-10-CM

## 2022-04-15 LAB — CBC WITH DIFFERENTIAL/PLATELET
Absolute Monocytes: 755 cells/uL (ref 200–950)
Basophils Absolute: 27 cells/uL (ref 0–200)
Basophils Relative: 0.3 %
Eosinophils Absolute: 100 cells/uL (ref 15–500)
Eosinophils Relative: 1.1 %
HCT: 37.1 % (ref 35.0–45.0)
Hemoglobin: 12.5 g/dL (ref 11.7–15.5)
Lymphs Abs: 1929 cells/uL (ref 850–3900)
MCH: 28.8 pg (ref 27.0–33.0)
MCHC: 33.7 g/dL (ref 32.0–36.0)
MCV: 85.5 fL (ref 80.0–100.0)
MPV: 9.6 fL (ref 7.5–12.5)
Monocytes Relative: 8.3 %
Neutro Abs: 6288 cells/uL (ref 1500–7800)
Neutrophils Relative %: 69.1 %
Platelets: 346 10*3/uL (ref 140–400)
RBC: 4.34 10*6/uL (ref 3.80–5.10)
RDW: 13.8 % (ref 11.0–15.0)
Total Lymphocyte: 21.2 %
WBC: 9.1 10*3/uL (ref 3.8–10.8)

## 2022-04-15 NOTE — Progress Notes (Unsigned)
   Subjective:    Patient ID: [name], female    DOB: 1945/12/24, 76 y.o.   MRN: [id number]  HPI 76 year old seen here on July 10 with cough and low-grade fever, chest discomfort nausea, vague abdominal symptoms.  COVID test was negative.  Respiratory virus panel was negative.  Urine culture showed mixed genital flora.  Her creatinine was elevated at 1.41.  This is a  high value for her .  Creatinine is usually around 0.91 or 1.03.  It was felt she was volume depleted on July 10.  She is feeling better.  Says she is drinking fluids.  Repeat CBC today is normal at 9100.  Clinically looks better and is in no acute distress.  She was started on a Zithromax Z-PAK for respiratory symptoms on July 10.  Chest x-ray was negative for pneumonia and abdominal film showed no obstruction.    Review of Systems see above.  No nausea, vomiting, diarrhea or significant obstipation at this time.  No abdominal pain.  No fever or shaking chills.  Not much energy.     Objective:   Physical Exam She looks less frail today and  stronger.  Temperature is 97.5 degrees by ear thermometer pulse 70 blood pressure 122/64 pulse oximetry 96% Skin: Warm and dry.  TMs clear.  Pharynx clear.  Neck is supple.  No adenopathy in cervical area.  Chest is clear to auscultation without rales or wheezing.  Abdomen: No tenderness in the epigastric or lower abdominal areas.  No lower extremity edema.  Her affect is brighter.      Assessment & Plan:  Apparent viral syndrome with respiratory symptoms and vague abdominal pain as well as nausea. Has  had cough.  COVID test was negative.  Respiratory virus panel was negative.  She had elevated creatinine of 1.41 felt to be consistent with volume depletion.  Chest x-ray and abdominal films were negative.  She is feeling better and she will finish her Z-Pak and call if symptoms return.  She should advance her diet slowly.

## 2022-04-16 ENCOUNTER — Encounter: Payer: Self-pay | Admitting: Internal Medicine

## 2022-04-16 NOTE — Patient Instructions (Addendum)
Rest and stay well-hydrated.  Call if symptoms worsen.  Labs have been drawn and are pending.  KUB shows no bowel obstruction.  Follow-up on July 14.  Call sooner if worse.  Started on Zithromax for respiratory symptoms.

## 2022-04-16 NOTE — Patient Instructions (Addendum)
Finish Zithromax Z-PAK as directed.  Return as needed.  Advance diet slowly.  Stay well-hydrated.  Call if symptoms return.

## 2022-04-28 ENCOUNTER — Other Ambulatory Visit: Payer: Self-pay | Admitting: Internal Medicine

## 2022-04-28 ENCOUNTER — Ambulatory Visit
Admission: RE | Admit: 2022-04-28 | Discharge: 2022-04-28 | Disposition: A | Discharge status: Home / Self Care | Payer: Medicare Other | Source: Ambulatory Visit | Attending: Internal Medicine | Admitting: Internal Medicine

## 2022-04-28 DIAGNOSIS — N6320 Unspecified lump in the left breast, unspecified quadrant: Secondary | ICD-10-CM

## 2022-04-28 DIAGNOSIS — N6310 Unspecified lump in the right breast, unspecified quadrant: Secondary | ICD-10-CM

## 2022-05-01 NOTE — Patient Instructions (Addendum)
Watch diet and continue to exercise regularly.  Follow-up in September with office visit and hemoglobin A1c.  Cut back a bit on vitamin B12 supplement since it appears to be over replaced at 1133.  Have ordered mammogram and bone density study for July.  Continue rosuvastatin 3 times a week.  Recommend Shingrix, COVID booster and Tdap as well as Prevnar 20.  Prevnar 20 vaccine given today.

## 2022-05-19 ENCOUNTER — Other Ambulatory Visit: Payer: Self-pay | Admitting: Internal Medicine

## 2022-05-30 ENCOUNTER — Other Ambulatory Visit: Payer: Self-pay | Admitting: Internal Medicine

## 2022-05-30 DIAGNOSIS — N6320 Unspecified lump in the left breast, unspecified quadrant: Secondary | ICD-10-CM

## 2022-05-30 DIAGNOSIS — N6310 Unspecified lump in the right breast, unspecified quadrant: Secondary | ICD-10-CM

## 2022-06-11 ENCOUNTER — Other Ambulatory Visit: Payer: Self-pay | Admitting: Internal Medicine

## 2022-06-23 ENCOUNTER — Other Ambulatory Visit: Payer: Medicare Other

## 2022-06-24 ENCOUNTER — Ambulatory Visit: Payer: Medicare Other | Admitting: Internal Medicine

## 2022-08-01 ENCOUNTER — Other Ambulatory Visit: Payer: Self-pay

## 2022-08-01 MED ORDER — ROSUVASTATIN CALCIUM 5 MG PO TABS: ORAL_TABLET | ORAL | 3 refills | Status: DC

## 2022-08-22 ENCOUNTER — Ambulatory Visit: Payer: Medicare Other | Admitting: Internal Medicine

## 2022-08-22 ENCOUNTER — Telehealth: Payer: Self-pay | Admitting: Internal Medicine

## 2022-08-22 ENCOUNTER — Encounter: Payer: Self-pay | Admitting: Internal Medicine

## 2022-08-22 VITALS — BP 112/60 | HR 65 | Temp 98.2°F

## 2022-08-22 DIAGNOSIS — J069 Acute upper respiratory infection, unspecified: Secondary | ICD-10-CM | POA: Diagnosis not present

## 2022-08-22 DIAGNOSIS — J22 Unspecified acute lower respiratory infection: Secondary | ICD-10-CM | POA: Diagnosis not present

## 2022-08-22 MED ORDER — BENZONATATE 100 MG PO CAPS: 100.0000 mg | ORAL_CAPSULE | Freq: Three times a day (TID) | ORAL | 0 refills | Status: DC | PRN

## 2022-08-22 MED ORDER — LEVOFLOXACIN 500 MG PO TABS: 500.0000 mg | ORAL_TABLET | Freq: Every day | ORAL | 0 refills | Status: AC

## 2022-08-22 MED ORDER — METHYLPREDNISOLONE ACETATE 80 MG/ML IJ SUSP
80.0000 mg | Freq: Once | INTRAMUSCULAR | Status: AC
  Administered 2022-08-22: 80 mg via INTRAMUSCULAR

## 2022-08-22 NOTE — Telephone Encounter (Signed)
Courtney Ryan [phone]  [name] called to say she had flu like symptoms last week, and it has left her with cough, runny nose and sinus symptoms this week, she tested last week when this started for COVID and it was negative. I have ask her to test again and call me back. I went ahead and scheduled for 3:30 this afternoon.

## 2022-08-22 NOTE — Progress Notes (Signed)
   Subjective:    Patient ID: Courtney Ryan, female    DOB: 07/16/1946, 76 y.o.   MRN: [id number]  HPI 76 year old Female seen for respiratory infection symptoms with cough and congestion, rhinorrhea and sinus pressure.  Says she has had symptoms for some 10 days.  Says she had a negative COVID test last week.  Says she had some flulike symptoms last week.  Has had malaise and fatigue.  I records do not indicate a recent flu vaccine.  She did have pneumococcal 20 vaccine in June 2023.  No recent COVID booster.  She does socialize with friends some.    Review of Systems see above -no headache- nausea- vomiting- diarrhea- chills, sore throat at the present time     Objective:   Physical Exam Temperature 98.2 degrees, pulse 65, blood pressure 112/60 pulse oximetry 96%  Skin: Warm and dry.  No cervical adenopathy.  Pharynx slightly injected without exudate.  TMs clear.  Neck supple.  Chest clear.  Respiratory rate is normal.       Assessment & Plan:  Acute lower respiratory infection  Plan: Tessalon Perles 100 mg 3 times daily as needed for cough.  Rest and stay well-hydrated.  Levaquin 500 mg daily for 7 days.  Take with a meal.

## 2022-08-22 NOTE — Patient Instructions (Signed)
Levaquin 500 mg daily with a meal for 7 days.  Tessalon Perles 100 mg up to 3 times daily as needed for cough.  Rest and stay well-hydrated.  Call if not improving in 7 to 10 days or sooner if worse.

## 2022-09-19 ENCOUNTER — Other Ambulatory Visit: Payer: Self-pay

## 2022-09-19 MED ORDER — LEVOTHYROXINE SODIUM 75 MCG PO TABS: ORAL_TABLET | ORAL | 1 refills | Status: DC

## 2022-09-28 ENCOUNTER — Other Ambulatory Visit: Payer: Self-pay

## 2022-09-28 MED ORDER — ALPRAZOLAM 0.25 MG PO TABS: 0.2500 mg | ORAL_TABLET | Freq: Two times a day (BID) | ORAL | 3 refills | Status: DC | PRN

## 2022-10-06 ENCOUNTER — Other Ambulatory Visit: Payer: Medicare Other

## 2022-10-06 DIAGNOSIS — I10 Essential (primary) hypertension: Secondary | ICD-10-CM

## 2022-10-06 DIAGNOSIS — E119 Type 2 diabetes mellitus without complications: Secondary | ICD-10-CM

## 2022-10-06 DIAGNOSIS — E039 Hypothyroidism, unspecified: Secondary | ICD-10-CM

## 2022-10-06 DIAGNOSIS — E1169 Type 2 diabetes mellitus with other specified complication: Secondary | ICD-10-CM

## 2022-10-07 ENCOUNTER — Ambulatory Visit (INDEPENDENT_AMBULATORY_CARE_PROVIDER_SITE_OTHER): Payer: Medicare Other | Admitting: Internal Medicine

## 2022-10-07 ENCOUNTER — Encounter: Payer: Self-pay | Admitting: Internal Medicine

## 2022-10-07 VITALS — BP 104/60 | HR 60 | Temp 98.1°F | Ht 62.75 in | Wt 154.8 lb

## 2022-10-07 DIAGNOSIS — I10 Essential (primary) hypertension: Secondary | ICD-10-CM | POA: Diagnosis not present

## 2022-10-07 DIAGNOSIS — Z8639 Personal history of other endocrine, nutritional and metabolic disease: Secondary | ICD-10-CM

## 2022-10-07 DIAGNOSIS — E1169 Type 2 diabetes mellitus with other specified complication: Secondary | ICD-10-CM

## 2022-10-07 DIAGNOSIS — E039 Hypothyroidism, unspecified: Secondary | ICD-10-CM

## 2022-10-07 DIAGNOSIS — E785 Hyperlipidemia, unspecified: Secondary | ICD-10-CM

## 2022-10-07 DIAGNOSIS — Z0000 Encounter for general adult medical examination without abnormal findings: Secondary | ICD-10-CM | POA: Diagnosis not present

## 2022-10-07 DIAGNOSIS — Z23 Encounter for immunization: Secondary | ICD-10-CM | POA: Diagnosis not present

## 2022-10-07 LAB — CBC WITH DIFFERENTIAL/PLATELET
Absolute Monocytes: 631 cells/uL (ref 200–950)
Basophils Absolute: 68 cells/uL (ref 0–200)
Basophils Relative: 0.7 %
Eosinophils Absolute: 68 cells/uL (ref 15–500)
Eosinophils Relative: 0.7 %
HCT: 42.1 % (ref 35.0–45.0)
Hemoglobin: 14 g/dL (ref 11.7–15.5)
Lymphs Abs: 2425 cells/uL (ref 850–3900)
MCH: 28.3 pg (ref 27.0–33.0)
MCHC: 33.3 g/dL (ref 32.0–36.0)
MCV: 85.1 fL (ref 80.0–100.0)
MPV: 9.3 fL (ref 7.5–12.5)
Monocytes Relative: 6.5 %
Neutro Abs: 6509 cells/uL (ref 1500–7800)
Neutrophils Relative %: 67.1 %
Platelets: 319 10*3/uL (ref 140–400)
RBC: 4.95 10*6/uL (ref 3.80–5.10)
RDW: 14.1 % (ref 11.0–15.0)
Total Lymphocyte: 25 %
WBC: 9.7 10*3/uL (ref 3.8–10.8)

## 2022-10-07 LAB — LIPID PANEL
Cholesterol: 163 mg/dL (ref <200)
HDL: 53 mg/dL (ref >=50)
LDL Cholesterol (Calc): 87 mg/dL (calc)
Non-HDL Cholesterol (Calc): 110 mg/dL (calc) (ref <130)
Total CHOL/HDL Ratio: 3.1 (calc) (ref <5.0)
Triglycerides: 126 mg/dL (ref <150)

## 2022-10-07 LAB — COMPLETE METABOLIC PANEL WITH GFR
AG Ratio: 1.4 (calc) (ref 1.0–2.5)
ALT: 12 U/L (ref 6–29)
AST: 11 U/L (ref 10–35)
Albumin: 4 g/dL (ref 3.6–5.1)
Alkaline phosphatase (APISO): 93 U/L (ref 37–153)
BUN: 25 mg/dL (ref 7–25)
CO2: 27 mmol/L (ref 20–32)
Calcium: 9.1 mg/dL (ref 8.6–10.4)
Chloride: 106 mmol/L (ref 98–110)
Creat: 1 mg/dL (ref 0.60–1.00)
Globulin: 2.8 g/dL (calc) (ref 1.9–3.7)
Glucose, Bld: 98 mg/dL (ref 65–99)
Potassium: 4.6 mmol/L (ref 3.5–5.3)
Sodium: 143 mmol/L (ref 135–146)
Total Bilirubin: 0.3 mg/dL (ref 0.2–1.2)
Total Protein: 6.8 g/dL (ref 6.1–8.1)
eGFR: 58 mL/min/{1.73_m2} — ABNORMAL LOW (ref >=60)

## 2022-10-07 LAB — POCT URINALYSIS DIPSTICK
Bilirubin, UA: NEGATIVE
Blood, UA: NEGATIVE
Glucose, UA: NEGATIVE
Ketones, UA: NEGATIVE
Leukocytes, UA: NEGATIVE
Nitrite, UA: NEGATIVE
Protein, UA: NEGATIVE
Spec Grav, UA: 1.015 (ref 1.010–1.025)
Urobilinogen, UA: 0.2 E.U./dL
pH, UA: 5 (ref 5.0–8.0)

## 2022-10-07 LAB — HEMOGLOBIN A1C
Hgb A1c MFr Bld: 7.1 % of total Hgb — ABNORMAL HIGH (ref <5.7)
Mean Plasma Glucose: 157 mg/dL
eAG (mmol/L): 8.7 mmol/L

## 2022-10-07 LAB — MICROALBUMIN / CREATININE URINE RATIO
Creatinine, Urine: 151 mg/dL (ref 20–275)
Microalb Creat Ratio: 3 mcg/mg creat (ref <30)
Microalb, Ur: 0.4 mg/dL

## 2022-10-07 LAB — TSH: TSH: 1.83 mIU/L (ref 0.40–4.50)

## 2022-10-07 NOTE — Progress Notes (Signed)
Annual Wellness Visit     Patient: Courtney Ryan, Female    DOB: [birth year], 77 y.o.   MRN: [id number] Visit Date: 10/07/2022   Subjective    Courtney Ryan is a 77 y.o. Female  presents today for her Annual  Medicare Wellness Visit and annual health maintenance exam.  HPI Says she had Covid booster this season.  There is no record for this at pharmacy.  She had COVID-19 in November 2022.   Confirmed via phone call that she had flu vaccine October 07, 2022.  Had pneumococcal 20 vaccine in June 2023. In November, had respiratory infection treated with Levaquin and Depomedrol.  She has a history of impaired glucose tolerance, hypertension, hyperlipidemia and is status post removal of parathyroid adenoma by Dr. Harlow Asa in June 2020.  Also history of hypothyroidism with goiter.  History of dependent edema, fibrocystic breast disease, history of migraine headaches, osteoarthritis, osteopenia and vitamin D deficiency.  She saw Dr. Ninfa Linden in December 2017 for trochanteric bursitis of left hip.  X-ray was unremarkable.  In March 2018 she saw Dr. Fredna Dow for trigger finger of right thumb.  History of irritable bowel syndrome.  History of bacterial vaginosis.  Past medical history: Had an ovary removed due to a cyst in 1968.  In the 1970s she had abnormal nevus removed from her left calf.  She had colonoscopy in 2006 in [place].  Right breast biopsy January 2009 which was benign.  In 2017 she was complaining of right groin and right lower quadrant abdominal pain.  CT of the abdomen and pelvis performed showing fatty liver and a ventral hernia containing only fat.  She had a left adrenal adenoma.  She had aortoiliac atherosclerosis.  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  No demonstrable mass or adenopathy in the abdomen.  Social history: She is a widow.  Husband died of complications of esophageal cancer.  She smokes about 5 cigarettes daily and drinks a couple of glasses of wine a week.  She has good  friends and neighbors.  Family history: Father died at age 41 of lung cancer.  Mother died at age 63 with cancer of the liver in 46.        Review of Systems no complaint of chest pain or shortness of breath   Objective    Vitals: Blood pressure 104/60 pulse 60 temperature 98.1 degrees pulse oximetry 95% weight 154 pounds 12.8 ounces BMI 27.64 height 5 feet 2.75 inches  Physical Exam  Skin: Warm and dry.  No cervical adenopathy, thyromegaly or carotid bruits.  TMs are clear.  Pharynx is clear.  Neck is supple.  Chest is clear.  Cardiac exam: Regular rate and rhythm without ectopy.  Abdomen is soft nondistended without hepatosplenomegaly masses or tenderness.  No lower extremity pitting edema.  Brief neurological exam is intact without gross focal deficits.  Her affect thought and judgment appear to be normal.  An A1c has gone up from 6.7% in June to 7.1%.  Admits to eating some sweet treats during the holiday season.  Despite this her fasting glucose January 4 was 98. She takes no medications for glucose control.  We will continue to monitor.  Her BUN and creatinine are normal.  Liver functions and TSH are normal.  Lipid panel is completely normal.  CBC is normal.  Most recent fall risk assessment:    08/22/2022    3:43 PM  Fall Risk   Falls in the past year? 0  Number falls in  past yr: 0  Injury with Fall? 0  Risk for fall due to : No Fall Risks  Follow up Falls evaluation completed    Most recent depression screenings:    08/22/2022    3:43 PM 09/30/2021    2:07 PM  PHQ 2/9 Scores  PHQ - 2 Score 0 4  PHQ- 9 Score  4   Most recent cognitive screening:    09/30/2021    2:09 PM  6CIT Screen  What Year? 0 points  What month? 0 points  What time? 0 points  Count back from 20 0 points  Months in reverse 0 points  Repeat phrase 0 points  Total Score 0 points       Assessment & Plan   Her hemoglobin A1c is 7.1%.  She does not really want to be on medication  for glucose control but I am a bit concerned that it is risen to the 7% range.  She will watch her diet and follow-up in July.  Hypothyroidism-TSH stable on levothyroxine 75 mcg daily  Mild hyperlipidemia treated with Crestor 5 mg 3 times a week and has normalized  Hypertension treated with Lasix 40 mg daily and atenolol 25 mg daily as well as amlodipine 5 mg daily.  Blood pressure is within normal limits.  History of hyperparathyroidism status post parathyroid adenoma removal  History of anxiety  Plan: Follow-up in 6 months.  Watch diet.  Try to exercise more.        Annual wellness visit done today including the all of the following: Reviewed patient's Family Medical History Reviewed and updated list of patient's medical providers Assessment of cognitive impairment was done Assessed patient's functional ability Established a written schedule for health screening [place] Completed and Reviewed  Discussed health benefits of physical activity, and encouraged her to engage in regular exercise appropriate for her age and condition.     IElby Showers, MD, have reviewed all documentation for this visit. The documentation on 10/30/22 for the exam, diagnosis, procedures, and orders are all accurate and complete.    {I, Elby Showers, MD, have reviewed all documentation for this visit. The documentation on 10/30/22 for the exam, diagnosis, procedures, and orders are all accurate and complete.  IElby Showers, MD, have reviewed all documentation for this visit. The documentation on 10/30/22 for the exam, diagnosis, procedures, and orders are all accurate and complete. LaVon Barron Alvine, CMA

## 2022-10-30 NOTE — Patient Instructions (Signed)
It was a pleasure to see you today.  Watch diet and try to walk some for exercise when the weather is pleasant.  Continue current medications and follow-up in July.

## 2022-11-03 LAB — BASIC METABOLIC PANEL
BUN: 24 — AB (ref 4–21)
CO2: 23 — AB (ref 13–22)
Chloride: 102 (ref 99–108)
Creatinine: 1.1 (ref 0.5–1.1)
Glucose: 122
Potassium: 3.9 mEq/L (ref 3.5–5.1)
Sodium: 141 (ref 137–147)

## 2022-11-03 LAB — CBC AND DIFFERENTIAL
HCT: 44 (ref 36–46)
Hemoglobin: 14.5 (ref 12.0–16.0)
Platelets: 303 10*3/uL (ref 150–400)
WBC: 8.8

## 2022-11-03 LAB — HEMOGLOBIN A1C: Hemoglobin A1C: 7

## 2022-11-03 LAB — COMPREHENSIVE METABOLIC PANEL
Calcium: 9.4 (ref 8.7–10.7)
eGFR: 54

## 2022-11-03 LAB — LIPID PANEL
Cholesterol: 178 (ref 0–200)
HDL: 50 (ref 35–70)
LDL Cholesterol: 102
Triglycerides: 149 (ref 40–160)

## 2022-11-03 LAB — VITAMIN B12: Vitamin B-12: 825

## 2022-11-03 LAB — TSH: TSH: 0.81 (ref 0.41–5.90)

## 2022-12-01 ENCOUNTER — Encounter: Payer: Self-pay | Admitting: Internal Medicine

## 2022-12-22 ENCOUNTER — Other Ambulatory Visit: Payer: Self-pay | Admitting: Internal Medicine

## 2022-12-23 ENCOUNTER — Other Ambulatory Visit: Payer: Self-pay

## 2022-12-23 MED ORDER — LEVOTHYROXINE SODIUM 75 MCG PO TABS
75.0000 ug | ORAL_TABLET | Freq: Every day | ORAL | 1 refills | Status: DC
Start: 2022-12-23 — End: 2023-04-14

## 2022-12-23 NOTE — Telephone Encounter (Signed)
Spoke with patient and patient stated she would like to stay on Synthroid and would like Rx sent to pharmacy

## 2023-01-09 ENCOUNTER — Ambulatory Visit
Admission: RE | Admit: 2023-01-09 | Discharge: 2023-01-09 | Disposition: A | Discharge status: Home / Self Care | Payer: Medicare Other | Source: Ambulatory Visit | Attending: Internal Medicine | Admitting: Internal Medicine

## 2023-01-09 ENCOUNTER — Other Ambulatory Visit: Payer: Self-pay | Admitting: Internal Medicine

## 2023-01-09 DIAGNOSIS — N6310 Unspecified lump in the right breast, unspecified quadrant: Secondary | ICD-10-CM

## 2023-01-09 DIAGNOSIS — N6320 Unspecified lump in the left breast, unspecified quadrant: Secondary | ICD-10-CM

## 2023-01-11 ENCOUNTER — Other Ambulatory Visit: Payer: Self-pay | Admitting: Internal Medicine

## 2023-01-16 ENCOUNTER — Encounter: Payer: Self-pay | Admitting: Internal Medicine

## 2023-01-16 ENCOUNTER — Telehealth: Payer: Self-pay

## 2023-01-16 ENCOUNTER — Ambulatory Visit: Payer: Medicare Other | Admitting: Internal Medicine

## 2023-01-16 VITALS — BP 138/70 | HR 63 | Temp 98.8°F | Ht 62.75 in | Wt 153.8 lb

## 2023-01-16 DIAGNOSIS — Z23 Encounter for immunization: Secondary | ICD-10-CM

## 2023-01-16 DIAGNOSIS — W5501XA Bitten by cat, initial encounter: Secondary | ICD-10-CM | POA: Diagnosis not present

## 2023-01-16 DIAGNOSIS — S61051A Open bite of right thumb without damage to nail, initial encounter: Secondary | ICD-10-CM | POA: Diagnosis not present

## 2023-01-16 MED ORDER — DOXYCYCLINE HYCLATE 100 MG PO TABS
100.0000 mg | ORAL_TABLET | Freq: Two times a day (BID) | ORAL | 0 refills | Status: DC
Start: 2023-01-16 — End: 2023-01-27

## 2023-01-16 NOTE — Telephone Encounter (Signed)
Patient left message stating she was bit by a cat and her hand has not healed yet and wanted to be seen I scheduled her for 2:30 this afternoon

## 2023-01-16 NOTE — Progress Notes (Signed)
Patient Care Team: Margaree Mackintosh, MD as PCP - General (Internal Medicine)  Visit Date: 01/16/23  Subjective:    Patient ID: Courtney Ryan , Female   DOB: 07-24-1946, 77 y.o.    MRN: [id number]   77 y.o. Female presents today for a cat bite on the dorsal aspect along the first MCP joint and thenar eminence. Pain on movement. Range of motion is good. Feeling intact. Has been soaking it in salt water.  Past Medical History:  Diagnosis Date   Anxiety    takes xanax as needed   Arthritis    bottom of spine and end of fingers   Bursitis of both knees    Bursitis of left hip    Cancer 01/31/2022   Skin Cancer - Basal Cell Carcinoma   COPD (chronic obstructive pulmonary disease)    test done 15 years ago "had the beginnings of COPD"   Depression    Fibrocystic breast disease    Goiter    Hyperlipidemia    Hypertension 2008   Dr. Lenord Fellers   Osteopenia    Polyp in anterior nares    just in Rt nostral   Vitamin D deficiency      Family History  Problem Relation Age of Onset   Liver cancer Mother    Breast cancer Neg Hx     Social History   Social History Narrative   Social history: She is a widow.  Husband died several months ago of esophageal cancer.  She smokes about 5 cigarettes daily and drinks a couple glasses of wine a week.  Has good friends and neighbors.       Family history: Father died at age 23 of lung cancer.  Mother died at age 59 with cancer of the liver in 1963      Review of Systems  Constitutional:  Negative for fever and malaise/fatigue.  HENT:  Negative for congestion.   Eyes:  Negative for blurred vision.  Respiratory:  Negative for cough and shortness of breath.   Cardiovascular:  Negative for chest pain, palpitations and leg swelling.  Gastrointestinal:  Negative for vomiting.  Musculoskeletal:  Negative for back pain.  Skin:  Negative for rash.       (+) Painful cat bite right thumb area  Neurological:  Negative for loss of consciousness and  headaches.        Objective:   Vitals: BP 138/70   Pulse 63   Temp 98.8 F (37.1 C) (Tympanic)   Ht 5' 2.75" (1.594 m)   Wt 153 lb 12.8 oz (69.8 kg)   SpO2 97%   BMI 27.46 kg/m    Physical Exam Vitals and nursing note reviewed.  Constitutional:      General: She is not in acute distress.    Appearance: Normal appearance. She is not toxic-appearing.  HENT:     Head: Normocephalic and atraumatic.  Pulmonary:     Effort: Pulmonary effort is normal.  Musculoskeletal:     Comments: Right first finger range of motion good.  Skin:    General: Skin is warm and dry.     Findings: Erythema present.     Comments: Erythema on dorsal aspect along right first MCP joint and thenar eminence.  Neurological:     Mental Status: She is alert and oriented to person, place, and time. Mental status is at baseline.  Psychiatric:        Mood and Affect: Mood normal.  Behavior: Behavior normal.        Thought Content: Thought content normal.        Judgment: Judgment normal.       Results:   Studies obtained and personally reviewed by me:   Labs:       Component Value Date/Time   NA 141 11/03/2022 0000   K 3.9 11/03/2022 0000   CL 102 11/03/2022 0000   CO2 23 (A) 11/03/2022 0000   GLUCOSE 98 10/06/2022 0914   BUN 24 (A) 11/03/2022 0000   CREATININE 1.1 11/03/2022 0000   CREATININE 1.00 10/06/2022 0914   CALCIUM 9.4 11/03/2022 0000   PROT 6.8 10/06/2022 0914   ALBUMIN 4.1 08/29/2016 1204   AST 11 10/06/2022 0914   ALT 12 10/06/2022 0914   ALKPHOS 85 08/29/2016 1204   BILITOT 0.3 10/06/2022 0914   GFRNONAA 50 (L) 03/22/2021 0917   GFRAA 58 (L) 03/22/2021 0917     Lab Results  Component Value Date   WBC 8.8 11/03/2022   HGB 14.5 11/03/2022   HCT 44 11/03/2022   MCV 85.1 10/06/2022   PLT 303 11/03/2022    Lab Results  Component Value Date   CHOL 178 11/03/2022   HDL 50 11/03/2022   LDLCALC 102 11/03/2022   TRIG 149 11/03/2022   CHOLHDL 3.1 10/06/2022     Lab Results  Component Value Date   HGBA1C 7.0 11/03/2022     Lab Results  Component Value Date   TSH 0.81 11/03/2022      Assessment & Plan:   Cellulitis dorsal aspect first right MCP joint, thenar eminence: prescribed doxycycline 100 mg twice daily. Administered tetanus vaccine.  Return in 10 days for an office visit. Contact us sooner if wound worsens.    I,Alexander Ruley,acting as a Neurosurgeon for Margaree Mackintosh, MD.,have documented all relevant documentation on the behalf of Margaree Mackintosh, MD,as directed by  Margaree Mackintosh, MD while in the presence of Margaree Mackintosh, MD.   I, Margaree Mackintosh, MD, have reviewed all documentation for this visit. The documentation on 01/28/23 for the exam, diagnosis, procedures, and orders are all accurate and complete.

## 2023-01-19 NOTE — Progress Notes (Signed)
Patient Care Team: Margaree Mackintosh, MD as PCP - General (Internal Medicine)  Visit Date: 01/26/23  Subjective:    Patient ID: Courtney Ryan , Female   DOB: 12-12-1945, 77 y.o.    MRN: [id number]   77 y.o. Female presents today for a follow-up visit regarding a cat bite in her right MCP joint area that was treated on 01/16/23 with doxycycline and a tetanus booster. Has been compliant with doxycyline, soaking as directed. Denies fever.   Past Medical History:  Diagnosis Date   Anxiety    takes xanax as needed   Arthritis    bottom of spine and end of fingers   Bursitis of both knees    Bursitis of left hip    Cancer 01/31/2022   Skin Cancer - Basal Cell Carcinoma   COPD (chronic obstructive pulmonary disease)    test done 15 years ago "had the beginnings of COPD"   Depression    Fibrocystic breast disease    Goiter    Hyperlipidemia    Hypertension 2008   Dr. Lenord Fellers   Osteopenia    Polyp in anterior nares    just in Rt nostral   Vitamin D deficiency      Family History  Problem Relation Age of Onset   Liver cancer Mother    Breast cancer Neg Hx     Social History   Social History Narrative   Social history: She is a widow.  Husband died several months ago of esophageal cancer.  She smokes about 5 cigarettes daily and drinks a couple glasses of wine a week.  Has good friends and neighbors.       Family history: Father died at age 33 of lung cancer.  Mother died at age 4 with cancer of the liver in 1963      Review of Systems  Constitutional:  Negative for fever and malaise/fatigue.  HENT:  Negative for congestion.   Eyes:  Negative for blurred vision.  Respiratory:  Negative for cough and shortness of breath.   Cardiovascular:  Negative for chest pain, palpitations and leg swelling.  Gastrointestinal:  Negative for vomiting.  Musculoskeletal:  Negative for back pain.  Skin:  Negative for rash.       (+) Tender cat bite site  Neurological:  Negative for  loss of consciousness and headaches.        Objective:   Vitals: BP 100/62   Pulse 60   Temp 98.4 F (36.9 C) (Tympanic)   Ht 5' 2.75" (1.594 m)   Wt 155 lb 12.8 oz (70.7 kg)   SpO2 97%   BMI 27.82 kg/m    Physical Exam Vitals and nursing note reviewed.  Constitutional:      General: She is not in acute distress.    Appearance: Normal appearance. She is not toxic-appearing.  HENT:     Head: Normocephalic and atraumatic.  Pulmonary:     Effort: Pulmonary effort is normal.  Skin:    General: Skin is warm and dry.     Comments: Tenderness, erythema at site of cat bite.  Neurological:     Mental Status: She is alert and oriented to person, place, and time. Mental status is at baseline.  Psychiatric:        Mood and Affect: Mood normal.        Behavior: Behavior normal.        Thought Content: Thought content normal.        Judgment:  Judgment normal.       Results:   Studies obtained and personally reviewed by me:    Labs:       Component Value Date/Time   NA 141 11/03/2022 0000   K 3.9 11/03/2022 0000   CL 102 11/03/2022 0000   CO2 23 (A) 11/03/2022 0000   GLUCOSE 98 10/06/2022 0914   BUN 24 (A) 11/03/2022 0000   CREATININE 1.1 11/03/2022 0000   CREATININE 1.00 10/06/2022 0914   CALCIUM 9.4 11/03/2022 0000   PROT 6.8 10/06/2022 0914   ALBUMIN 4.1 08/29/2016 1204   AST 11 10/06/2022 0914   ALT 12 10/06/2022 0914   ALKPHOS 85 08/29/2016 1204   BILITOT 0.3 10/06/2022 0914   GFRNONAA 50 (L) 03/22/2021 0917   GFRAA 58 (L) 03/22/2021 0917     Lab Results  Component Value Date   WBC 8.8 11/03/2022   HGB 14.5 11/03/2022   HCT 44 11/03/2022   MCV 85.1 10/06/2022   PLT 303 11/03/2022    Lab Results  Component Value Date   CHOL 178 11/03/2022   HDL 50 11/03/2022   LDLCALC 102 11/03/2022   TRIG 149 11/03/2022   CHOLHDL 3.1 10/06/2022    Lab Results  Component Value Date   HGBA1C 7.0 11/03/2022     Lab Results  Component Value Date   TSH  0.81 11/03/2022      Assessment & Plan:   Cellulitis dorsal aspect first right MCP joint, thenar eminence: Ordered CBC with Diff/Plat. Prescribed levofloxacin 500 mg once daily for 7 days.   Return in 7 days for office visit. Contact us if wound worsens.    I,Alexander Ruley,acting as a Neurosurgeon for Margaree Mackintosh, MD.,have documented all relevant documentation on the behalf of Margaree Mackintosh, MD,as directed by  Margaree Mackintosh, MD while in the presence of Margaree Mackintosh, MD.   I, Margaree Mackintosh, MD, have reviewed all documentation for this visit. The documentation on 01/30/23 for the exam, diagnosis, procedures, and orders are all accurate and complete.

## 2023-01-26 ENCOUNTER — Ambulatory Visit: Payer: Medicare Other | Admitting: Internal Medicine

## 2023-01-26 ENCOUNTER — Encounter: Payer: Self-pay | Admitting: Internal Medicine

## 2023-01-26 VITALS — BP 100/62 | HR 60 | Temp 98.4°F | Ht 62.75 in | Wt 155.8 lb

## 2023-01-26 DIAGNOSIS — L03011 Cellulitis of right finger: Secondary | ICD-10-CM

## 2023-01-26 DIAGNOSIS — W5501XD Bitten by cat, subsequent encounter: Secondary | ICD-10-CM | POA: Diagnosis not present

## 2023-01-26 LAB — CBC WITH DIFFERENTIAL/PLATELET
Absolute Monocytes: 716 cells/uL (ref 200–950)
Basophils Absolute: 84 cells/uL (ref 0–200)
Basophils Relative: 0.9 %
Eosinophils Absolute: 177 cells/uL (ref 15–500)
Eosinophils Relative: 1.9 %
HCT: 41.8 % (ref 35.0–45.0)
Hemoglobin: 13.8 g/dL (ref 11.7–15.5)
Lymphs Abs: 2195 cells/uL (ref 850–3900)
MCH: 27.8 pg (ref 27.0–33.0)
MCHC: 33 g/dL (ref 32.0–36.0)
MCV: 84.3 fL (ref 80.0–100.0)
MPV: 9.4 fL (ref 7.5–12.5)
Monocytes Relative: 7.7 %
Neutro Abs: 6129 cells/uL (ref 1500–7800)
Neutrophils Relative %: 65.9 %
Platelets: 243 10*3/uL (ref 140–400)
RBC: 4.96 10*6/uL (ref 3.80–5.10)
RDW: 14 % (ref 11.0–15.0)
Total Lymphocyte: 23.6 %
WBC: 9.3 10*3/uL (ref 3.8–10.8)

## 2023-01-26 MED ORDER — LEVOFLOXACIN 500 MG PO TABS: 500.0000 mg | ORAL_TABLET | Freq: Every day | ORAL | 0 refills | Status: DC

## 2023-01-26 NOTE — Patient Instructions (Addendum)
CBC with diff Drawn. Levaquin 500 mg daily for 10 days. Call if not better in 2-3 days

## 2023-01-27 ENCOUNTER — Telehealth: Payer: Self-pay | Admitting: Internal Medicine

## 2023-01-27 MED ORDER — DOXYCYCLINE HYCLATE 100 MG PO TABS: 100.0000 mg | ORAL_TABLET | Freq: Two times a day (BID) | ORAL | 0 refills | Status: DC

## 2023-01-27 NOTE — Telephone Encounter (Addendum)
Patient would like to refill doxycycline and hold off on referral to see how second round of antibiotics work.  Patient asked if levofloxacin could be added to medication allergy list

## 2023-01-27 NOTE — Telephone Encounter (Signed)
Courtney Ryan [phone]  [name] called to say she is not going to take antibiotic because it says she should not because she has neuropathy. She ask do you want to give her another one?

## 2023-01-28 NOTE — Patient Instructions (Addendum)
Have prescribed doxycycline 100 mg twice daily for 10 days.  There are multiple drug allergies that the patient has affecting choice of antibiotics.  She is to return in 10 days.  Contact us sooner if wound worsens.  Tetanus update given today.

## 2023-02-07 LAB — HM DIABETES EYE EXAM

## 2023-02-16 ENCOUNTER — Telehealth: Payer: Self-pay | Admitting: Internal Medicine

## 2023-02-16 MED ORDER — PREMARIN 0.625 MG/GM VA CREA: TOPICAL_CREAM | VAGINAL | 11 refills | Status: DC

## 2023-02-16 NOTE — Addendum Note (Signed)
Addended by: Mary Sella D on: 02/16/2023 05:25 PM   Modules accepted: Orders

## 2023-02-16 NOTE — Telephone Encounter (Signed)
Lesleigh Prestigiacomo [phone]  Ja called to say she needs refill on below medication, she does not have any at the pharmacy it has been to long since she got last refill.  PREMARIN vaginal cream    [id number] [place],  - [address] AT [facility] [address] Phone: [phone]  Fax: [phone]

## 2023-02-16 NOTE — Telephone Encounter (Signed)
Rx sent to the pharmacy.

## 2023-03-10 LAB — HM DIABETES EYE EXAM

## 2023-04-10 ENCOUNTER — Other Ambulatory Visit: Payer: Medicare Other

## 2023-04-10 DIAGNOSIS — E119 Type 2 diabetes mellitus without complications: Secondary | ICD-10-CM

## 2023-04-10 DIAGNOSIS — E7849 Other hyperlipidemia: Secondary | ICD-10-CM

## 2023-04-11 LAB — HEPATIC FUNCTION PANEL
AG Ratio: 1.3 (calc) (ref 1.0–2.5)
ALT: 10 U/L (ref 6–29)
AST: 10 U/L (ref 10–35)
Albumin: 4.1 g/dL (ref 3.6–5.1)
Alkaline phosphatase (APISO): 97 U/L (ref 37–153)
Bilirubin, Direct: 0.1 mg/dL (ref 0.0–0.2)
Globulin: 3.1 g/dL (calc) (ref 1.9–3.7)
Indirect Bilirubin: 0.2 mg/dL (calc) (ref 0.2–1.2)
Total Bilirubin: 0.3 mg/dL (ref 0.2–1.2)
Total Protein: 7.2 g/dL (ref 6.1–8.1)

## 2023-04-11 LAB — LIPID PANEL
Cholesterol: 165 mg/dL (ref <200)
HDL: 42 mg/dL — ABNORMAL LOW (ref >=50)
LDL Cholesterol (Calc): 95 mg/dL (calc)
Non-HDL Cholesterol (Calc): 123 mg/dL (calc) (ref <130)
Total CHOL/HDL Ratio: 3.9 (calc) (ref <5.0)
Triglycerides: 189 mg/dL — ABNORMAL HIGH (ref <150)

## 2023-04-11 LAB — HEMOGLOBIN A1C
Hgb A1c MFr Bld: 7 % of total Hgb — ABNORMAL HIGH (ref <5.7)
Mean Plasma Glucose: 154 mg/dL
eAG (mmol/L): 8.5 mmol/L

## 2023-04-14 ENCOUNTER — Ambulatory Visit: Payer: Medicare Other | Admitting: Internal Medicine

## 2023-04-14 ENCOUNTER — Encounter: Payer: Self-pay | Admitting: Internal Medicine

## 2023-04-14 VITALS — BP 118/72 | HR 62 | Temp 98.3°F | Resp 16 | Ht 62.75 in | Wt 152.5 lb

## 2023-04-14 DIAGNOSIS — E039 Hypothyroidism, unspecified: Secondary | ICD-10-CM | POA: Diagnosis not present

## 2023-04-14 DIAGNOSIS — I10 Essential (primary) hypertension: Secondary | ICD-10-CM

## 2023-04-14 DIAGNOSIS — E1169 Type 2 diabetes mellitus with other specified complication: Secondary | ICD-10-CM

## 2023-04-14 DIAGNOSIS — E785 Hyperlipidemia, unspecified: Secondary | ICD-10-CM | POA: Diagnosis not present

## 2023-04-14 MED ORDER — ROSUVASTATIN CALCIUM 10 MG PO TABS
10.0000 mg | ORAL_TABLET | Freq: Every day | ORAL | 3 refills | Status: DC
Start: 2023-04-14 — End: 2024-02-05

## 2023-04-14 MED ORDER — LEVOTHYROXINE SODIUM 75 MCG PO TABS: ORAL_TABLET | ORAL | 0 refills | Status: DC

## 2023-04-14 NOTE — Progress Notes (Addendum)
Patient Care Team: Margaree Mackintosh, MD as PCP - General (Internal Medicine) Gean Birchwood, [facility]  Visit Date: 04/14/23  Subjective:    Patient ID: Courtney Ryan , Female   DOB: 08/05/1946, 77 y.o.    MRN: [id number]   77 y.o. Female presents today for 6 month follow up.  She complains of swelling and soreness in her right neck that seems to be worsening again. Although she does endorse a recent cold, she notes recurrent swelling of her neck about every 6 weeks. Eventually the swelling improves on its own. She has seen a dentist to rule out possible dental concerns.  Currently she has a mild, intermittent cough with significant sinus drainage.  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  Also history of hypothyroidism with goiter.  History of dependent edema, fibrocystic breast disease, history of migraine headaches, osteoarthritis, osteopenia and vitamin D deficiency. History of irritable bowel syndrome.  History of bacterial vaginosis.   Past medical history: Had an ovary removed due to a cyst in 1968.  In the 1970s she had abnormal nevus removed from her left calf.  She had colonoscopy in 2006 in [place].  Right breast biopsy January 2009 which was benign.   In 2017 she was complaining of right groin and right lower quadrant abdominal pain.  CT of the abdomen and pelvis performed showing fatty liver and a ventral hernia containing only fat.  She had a left adrenal adenoma.  She had aortoiliac atherosclerosis.  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  No demonstrable mass or adenopathy in the abdomen.  She saw Dr. Magnus Ivan in December 2017 for trochanteric bursitis of left hip.  X-ray was unremarkable.  In March 2018 she saw Dr. Merlyn Lot for trigger finger of right thumb.  Social history: She is a widow.  Husband died of complications of esophageal cancer.  She smokes  about 5 cigarettes daily and drinks a couple of glasses of wine a week.  She has good friends and neighbors.   Family history: Father died at age 72 of lung cancer.  Mother died at age 33 with cancer of the liver in 34.   Past Medical History:  Diagnosis Date   Anxiety    takes xanax as needed   Arthritis    bottom of spine and end of fingers   Bursitis of both knees    Bursitis of left hip    Cancer (HCC) 01/31/2022   Skin Cancer - Basal Cell Carcinoma   COPD (chronic obstructive pulmonary disease) (HCC)    test done 15 years ago "had the beginnings of COPD"   Depression    Fibrocystic breast disease    Goiter    Hyperlipidemia    Hypertension 2008   Dr. Lenord Fellers   Osteopenia    Polyp in anterior nares    just in Rt nostral   Vitamin D deficiency      Family History  Problem Relation Age of Onset   Liver cancer Mother    Breast cancer Neg Hx     Social History   Social History Narrative   Social history: She is a widow.  Husband died several months ago of esophageal cancer.  She smokes about 5 cigarettes daily and drinks a couple glasses of wine a week.  Has good friends and neighbors.       Family history: Father died at age 67  of lung cancer.  Mother died at age 46 with cancer of the liver in 1963      Review of Systems  Constitutional:  Negative for chills and fever.  HENT:  Negative for sore throat.   Respiratory:  Positive for cough. Negative for shortness of breath.   Cardiovascular:  Negative for chest pain and palpitations.  Gastrointestinal:  Negative for blood in stool, diarrhea, nausea and vomiting.  Genitourinary:  Negative for dysuria and hematuria.  Musculoskeletal:  Positive for neck pain (Soreness/swelling of left neck). Negative for back pain.  Skin:  Negative for rash.  Neurological:  Negative for dizziness, loss of consciousness and headaches.        Objective:   Vitals: BP 118/72   Pulse 62   Temp 98.3 F (36.8 C) (Tympanic)   Resp  16   Ht 5' 2.75" (1.594 m)   Wt 152 lb 8 oz (69.2 kg)   SpO2 96%   BMI 27.23 kg/m    Physical Exam Constitutional:      General: She is not in acute distress.    Appearance: Normal appearance. She is not ill-appearing.  HENT:     Head: Normocephalic and atraumatic.  Neck:     Vascular: No carotid bruit.     Comments: No significant adenopathy appreciated. Cardiovascular:     Rate and Rhythm: Normal rate and regular rhythm.  Lymphadenopathy:     Cervical: No cervical adenopathy.  Skin:    General: Skin is warm and dry.  Neurological:     General: No focal deficit present.     Mental Status: She is alert and oriented to person, place, and time.  Psychiatric:        Mood and Affect: Mood normal.        Behavior: Behavior normal.        Thought Content: Thought content normal.        Judgment: Judgment normal.       Results:   Studies obtained and personally reviewed by me:  Imaging, colonoscopy, mammogram, bone density scan, echocardiogram, heart cath, stress test, CT calcium score, etc.    Labs:       Component Value Date/Time   NA 141 11/03/2022 0000   K 3.9 11/03/2022 0000   CL 102 11/03/2022 0000   CO2 23 (A) 11/03/2022 0000   GLUCOSE 98 10/06/2022 0914   BUN 24 (A) 11/03/2022 0000   CREATININE 1.1 11/03/2022 0000   CREATININE 1.00 10/06/2022 0914   CALCIUM 9.4 11/03/2022 0000   PROT 7.2 04/10/2023 0929   ALBUMIN 4.1 08/29/2016 1204   AST 10 04/10/2023 0929   ALT 10 04/10/2023 0929   ALKPHOS 85 08/29/2016 1204   BILITOT 0.3 04/10/2023 0929   GFRNONAA 50 (L) 03/22/2021 0917   GFRAA 58 (L) 03/22/2021 0917     Lab Results  Component Value Date   WBC 9.3 01/26/2023   HGB 13.8 01/26/2023   HCT 41.8 01/26/2023   MCV 84.3 01/26/2023   PLT 243 01/26/2023    Lab Results  Component Value Date   CHOL 165 04/10/2023   HDL 42 (L) 04/10/2023   LDLCALC 95 04/10/2023   TRIG 189 (H) 04/10/2023   CHOLHDL 3.9 04/10/2023    Lab Results  Component Value  Date   HGBA1C 7.0 (H) 04/10/2023     Lab Results  Component Value Date   TSH 0.81 11/03/2022        Assessment & Plan:   Hypothyroidism - TSH  stable on levothyroxine 75 mcg daily. She requests that her prescription be name brand only.   Type 2 diabetes mellitus --Her hemoglobin A1c is 7.0% as of 04/2023. Previously while taking metformin she had felt nauseated. We discussed dietary recommendations including reducing her carbohydrate intake. Recheck A1c in 3 months.  Mild hyperlipidemia - previously treated with Crestor 5 mg 3 times a week. We reviewed her lipid panel 04/2023 which showed her triglycerides are 40 points higher. We will increase her rosuvastatin to 10 mg daily.  Hypertension - treated with Lasix 40 mg daily and atenolol 25 mg daily as well as amlodipine 5 mg daily.  Blood pressure is within normal limits.  Vaccine counseling: She plans to receive a Covid-19 booster and update her influenza vaccine this Fall. Tetanus and pneumococcal vaccines are up to date.  Follow-up in 3 months.   I,Mathew Stumpf,acting as a Neurosurgeon for Margaree Mackintosh, MD.,have documented all relevant documentation on the behalf of Margaree Mackintosh, MD,as directed by  Margaree Mackintosh, MD while in the presence of Margaree Mackintosh, MD.  I, Margaree Mackintosh, MD, have reviewed all documentation for this visit. The documentation on 05/01/23 for the exam, diagnosis, procedures, and orders are all accurate and complete.

## 2023-04-17 ENCOUNTER — Encounter: Payer: Self-pay | Admitting: Internal Medicine

## 2023-04-30 NOTE — Patient Instructions (Signed)
Increase rosuvastatin to 10 mg daily.  Continue atenolol and Lasix as well as amlodipine for hypertension.  TSH is stable on current dose of levothyroxine 75 mcg daily.  She wants brand-name thyroid replacement medication.  (Synthroid).  Advised COVID-vaccine update and flu vaccine this fall.  Follow-up here regarding glucose control in 3 months.

## 2023-05-08 ENCOUNTER — Other Ambulatory Visit: Payer: Self-pay | Admitting: Internal Medicine

## 2023-05-11 LAB — LAB REPORT - SCANNED
A1c: 6.7
EGFR: 50

## 2023-05-12 ENCOUNTER — Other Ambulatory Visit: Payer: Self-pay | Admitting: Internal Medicine

## 2023-07-03 ENCOUNTER — Telehealth: Payer: Self-pay | Admitting: Internal Medicine

## 2023-07-03 NOTE — Telephone Encounter (Signed)
LVM to reschedule appointment on 07/18/2023

## 2023-07-10 NOTE — Progress Notes (Signed)
Patient Care Team: Margaree Mackintosh, MD as PCP - General (Internal Medicine) Gean Birchwood, [facility]  Visit Date: 07/17/23  Subjective:    Patient ID: Courtney Ryan , Female   DOB: 04/14/1946, 77 y.o.    MRN: [id number]   77 y.o. Female presents today for a 43-month follow-up. History of hyperlipidemia treated with rosuvastatin 10 mg daily. She has been taking this increased dose since 04/14/23. HDL low at 43. TRIG elevated at 168 on 07/13/23, down from 189 on 04/10/23. Liver functions normal.  Reports having persistent cough, post-nasal drip recently. She has a history of allergic rhinitis. Allergic to dust mites, mold. She has Lawyer that she uses 2-3 times monthly.  History of hypertension treated with atenolol 25 mg daily, amlodipine 5 mg daily. Blood pressure normal today at 120/60.  History of dependent edema treated with Lasix 40 mg daily. She has been having some feet swelling recently.  History of Type 2 diabetes mellitus. Does not tolerate metformin. HGBA1c at 7.0% on 07/13/23, no change from 04/10/23. Reports she is limiting carbs in her diet. She eats breakfast regularly and a larger meal at lunchtime or dinner time.  She has neuropathy, stiffness in both feet that is worse in the morning after walking. Tingling is on the bottom of her feet.  History of hypothyroidism treated with Synthroid 75 mcg daily. TSH at 1.06.  Past Medical History:  Diagnosis Date   Anxiety    takes xanax as needed   Arthritis    bottom of spine and end of fingers   Bursitis of both knees    Bursitis of left hip    Cancer (HCC) 01/31/2022   Skin Cancer - Basal Cell Carcinoma   COPD (chronic obstructive pulmonary disease) (HCC)    test done 15 years ago "had the beginnings of COPD"   Depression    Fibrocystic breast disease    Goiter    Hyperlipidemia    Hypertension 2008   Dr. Lenord Fellers   Osteopenia    Polyp in anterior nares    just in Rt nostral   Vitamin D deficiency       Family History  Problem Relation Age of Onset   Liver cancer Mother    Breast cancer Neg Hx     Social History   Social History Narrative   Social history: She is a widow.  Husband died several months ago of esophageal cancer.  She smokes about 5 cigarettes daily and drinks a couple glasses of wine a week.  Has good friends and neighbors.       Family history: Father died at age 2 of lung cancer.  Mother died at age 44 with cancer of the liver in 1963      Review of Systems  Constitutional:  Negative for fever and malaise/fatigue.  HENT:  Negative for congestion.        (+) Post-nasal drip  Eyes:  Negative for blurred vision.  Respiratory:  Positive for cough. Negative for shortness of breath.   Cardiovascular:  Positive for leg swelling (Feet). Negative for chest pain and palpitations.  Gastrointestinal:  Negative for vomiting.  Musculoskeletal:  Negative for back pain.  Skin:  Negative for rash.  Neurological:  Positive for tingling (Feet). Negative for loss of consciousness and headaches.        Objective:   Vitals: BP 120/60   Pulse (!) 58   Temp 98.1 F (36.7 C)   Ht 5' 2.75" (1.594 m)  Wt 151 lb (68.5 kg)   SpO2 96%   BMI 26.96 kg/m    Physical Exam Vitals and nursing note reviewed.  Constitutional:      General: She is not in acute distress.    Appearance: Normal appearance. She is not toxic-appearing.  HENT:     Head: Normocephalic and atraumatic.  Neck:     Vascular: No carotid bruit.  Cardiovascular:     Rate and Rhythm: Normal rate and regular rhythm. No extrasystoles are present.    Pulses: Normal pulses.     Heart sounds: Normal heart sounds. No murmur heard.    No friction rub. No gallop.  Pulmonary:     Effort: Pulmonary effort is normal. No respiratory distress.     Breath sounds: Normal breath sounds. No wheezing or rales.  Skin:    General: Skin is warm and dry.  Neurological:     Mental Status: She is alert and oriented to  person, place, and time. Mental status is at baseline.  Psychiatric:        Mood and Affect: Mood normal.        Behavior: Behavior normal.        Thought Content: Thought content normal.        Judgment: Judgment normal.       Results:   Studies obtained and personally reviewed by me:   Labs:       Component Value Date/Time   NA 141 11/03/2022 0000   K 3.9 11/03/2022 0000   CL 102 11/03/2022 0000   CO2 23 (A) 11/03/2022 0000   GLUCOSE 98 10/06/2022 0914   BUN 24 (A) 11/03/2022 0000   CREATININE 1.1 11/03/2022 0000   CREATININE 1.00 10/06/2022 0914   CALCIUM 9.4 11/03/2022 0000   PROT 7.1 07/13/2023 0926   ALBUMIN 4.1 08/29/2016 1204   AST 12 07/13/2023 0926   ALT 13 07/13/2023 0926   ALKPHOS 85 08/29/2016 1204   BILITOT 0.3 07/13/2023 0926   GFRNONAA 50 (L) 03/22/2021 0917   GFRAA 58 (L) 03/22/2021 0917     Lab Results  Component Value Date   WBC 9.3 01/26/2023   HGB 13.8 01/26/2023   HCT 41.8 01/26/2023   MCV 84.3 01/26/2023   PLT 243 01/26/2023    Lab Results  Component Value Date   CHOL 129 07/13/2023   HDL 43 (L) 07/13/2023   LDLCALC 61 07/13/2023   TRIG 168 (H) 07/13/2023   CHOLHDL 3.0 07/13/2023    Lab Results  Component Value Date   HGBA1C 7.0 (H) 07/13/2023     Lab Results  Component Value Date   TSH 1.06 07/13/2023      Assessment & Plan:   Peripheral neuropathy: prescribed gabapentin 300 mg twice daily.  Hyperlipidemia: treated with rosuvastatin 10 mg daily. HDL low at 43. TRIG elevated at 168 on 07/13/23, down from 189 on 04/10/23. Liver functions normal.  Persistent cough and post-nasal drip / allergic rhinitis: can take Tessalon Perles as directed for cough.  Hypertension: treated with atenolol 25 mg daily, amlodipine 5 mg daily. Blood pressure normal today at 120/60.   Dependent edema: treated with Lasix 40 mg daily.  Type 2 diabetes mellitus: start pioglitazone 15 mg daily with lunch. HGBA1c at 7.0% on 07/13/23, no change  from 04/10/23.   Hypothyroidism: treated with Synthroid 75 mcg daily. TSH at 1.06.  Return in 3 months for A1c check, office visit.    I,Alexander Ruley,acting as a Neurosurgeon for [employer],  MD.,have documented all relevant documentation on the behalf of Margaree Mackintosh, MD,as directed by  Margaree Mackintosh, MD while in the presence of Margaree Mackintosh, MD.   I, Margaree Mackintosh, MD, have reviewed all documentation for this visit. The documentation on 07/30/23 for the exam, diagnosis, procedures, and orders are all accurate and complete.

## 2023-07-11 ENCOUNTER — Ambulatory Visit
Admission: RE | Admit: 2023-07-11 | Discharge: 2023-07-11 | Disposition: A | Discharge status: Home / Self Care | Payer: Medicare Other | Source: Ambulatory Visit | Attending: Internal Medicine | Admitting: Internal Medicine

## 2023-07-11 DIAGNOSIS — N6310 Unspecified lump in the right breast, unspecified quadrant: Secondary | ICD-10-CM

## 2023-07-11 DIAGNOSIS — N6320 Unspecified lump in the left breast, unspecified quadrant: Secondary | ICD-10-CM

## 2023-07-13 ENCOUNTER — Other Ambulatory Visit: Payer: Medicare Other

## 2023-07-13 DIAGNOSIS — E119 Type 2 diabetes mellitus without complications: Secondary | ICD-10-CM

## 2023-07-13 DIAGNOSIS — E1169 Type 2 diabetes mellitus with other specified complication: Secondary | ICD-10-CM

## 2023-07-13 DIAGNOSIS — E039 Hypothyroidism, unspecified: Secondary | ICD-10-CM

## 2023-07-14 LAB — HEPATIC FUNCTION PANEL
AG Ratio: 1.4 (calc) (ref 1.0–2.5)
ALT: 13 U/L (ref 6–29)
AST: 12 U/L (ref 10–35)
Albumin: 4.1 g/dL (ref 3.6–5.1)
Alkaline phosphatase (APISO): 106 U/L (ref 37–153)
Bilirubin, Direct: 0.1 mg/dL (ref 0.0–0.2)
Globulin: 3 g/dL (ref 1.9–3.7)
Indirect Bilirubin: 0.2 mg/dL (ref 0.2–1.2)
Total Bilirubin: 0.3 mg/dL (ref 0.2–1.2)
Total Protein: 7.1 g/dL (ref 6.1–8.1)

## 2023-07-14 LAB — HEMOGLOBIN A1C
Hgb A1c MFr Bld: 7 %{Hb} — ABNORMAL HIGH (ref <5.7)
Mean Plasma Glucose: 154 mg/dL
eAG (mmol/L): 8.5 mmol/L

## 2023-07-14 LAB — LIPID PANEL
Cholesterol: 129 mg/dL (ref <200)
HDL: 43 mg/dL — ABNORMAL LOW (ref >=50)
LDL Cholesterol (Calc): 61 mg/dL
Non-HDL Cholesterol (Calc): 86 mg/dL (ref <130)
Total CHOL/HDL Ratio: 3 (calc) (ref <5.0)
Triglycerides: 168 mg/dL — ABNORMAL HIGH (ref <150)

## 2023-07-14 LAB — TSH: TSH: 1.06 m[IU]/L (ref 0.40–4.50)

## 2023-07-17 ENCOUNTER — Ambulatory Visit: Payer: Medicare Other | Admitting: Internal Medicine

## 2023-07-17 ENCOUNTER — Encounter: Payer: Self-pay | Admitting: Internal Medicine

## 2023-07-17 VITALS — BP 120/60 | HR 58 | Temp 98.1°F | Ht 62.75 in | Wt 151.0 lb

## 2023-07-17 DIAGNOSIS — R059 Cough, unspecified: Secondary | ICD-10-CM

## 2023-07-17 DIAGNOSIS — Z8639 Personal history of other endocrine, nutritional and metabolic disease: Secondary | ICD-10-CM

## 2023-07-17 DIAGNOSIS — G609 Hereditary and idiopathic neuropathy, unspecified: Secondary | ICD-10-CM

## 2023-07-17 DIAGNOSIS — I10 Essential (primary) hypertension: Secondary | ICD-10-CM

## 2023-07-17 DIAGNOSIS — E039 Hypothyroidism, unspecified: Secondary | ICD-10-CM | POA: Diagnosis not present

## 2023-07-17 DIAGNOSIS — R609 Edema, unspecified: Secondary | ICD-10-CM

## 2023-07-17 DIAGNOSIS — E1169 Type 2 diabetes mellitus with other specified complication: Secondary | ICD-10-CM | POA: Diagnosis not present

## 2023-07-17 DIAGNOSIS — E785 Hyperlipidemia, unspecified: Secondary | ICD-10-CM

## 2023-07-17 MED ORDER — PIOGLITAZONE HCL 15 MG PO TABS: 15.0000 mg | ORAL_TABLET | Freq: Every day | ORAL | 1 refills | Status: DC

## 2023-07-18 ENCOUNTER — Ambulatory Visit: Payer: Medicare Other | Admitting: Internal Medicine

## 2023-07-30 NOTE — Patient Instructions (Signed)
Your lungs are clear and I feel that you likely have allergic rhinitis and postnasal drip.  May take Tessalon Perles if needed for cough.  We may need to refer you to allergist for allergy testing.  Continue amlodipine and atenolol for hypertension.  Blood pressure is normal.  For lower extremity leg swelling continue Lasix 40 mg daily.  Start pioglitazone 15 mg daily at lunch because hemoglobin A1c is elevated at 7% and has not changed since July.  We will follow-up in 3 months.  Continue same dose of thyroid replacement medication as your TSH is normal.  In 3 months we will check hemoglobin A1c and see you again in the office.

## 2023-08-07 ENCOUNTER — Other Ambulatory Visit: Payer: Self-pay | Admitting: Internal Medicine

## 2023-08-30 ENCOUNTER — Ambulatory Visit: Payer: Medicare Other | Admitting: Internal Medicine

## 2023-08-30 ENCOUNTER — Telehealth: Payer: Self-pay | Admitting: Internal Medicine

## 2023-08-30 ENCOUNTER — Encounter: Payer: Self-pay | Admitting: Internal Medicine

## 2023-08-30 VITALS — BP 110/60 | HR 61 | Temp 97.9°F | Ht 62.75 in | Wt 151.0 lb

## 2023-08-30 DIAGNOSIS — H9201 Otalgia, right ear: Secondary | ICD-10-CM | POA: Diagnosis not present

## 2023-08-30 MED ORDER — LEVOFLOXACIN 500 MG PO TABS: 500.0000 mg | ORAL_TABLET | Freq: Every day | ORAL | 0 refills | Status: AC

## 2023-08-30 MED ORDER — HYDROCODONE BIT-HOMATROP MBR 5-1.5 MG/5ML PO SOLN: 5.0000 mL | Freq: Three times a day (TID) | ORAL | 0 refills | Status: DC | PRN

## 2023-08-30 NOTE — Telephone Encounter (Signed)
Would you like for her to come in at 12pm or do a video visit?

## 2023-08-30 NOTE — Telephone Encounter (Signed)
scheduled

## 2023-08-30 NOTE — Telephone Encounter (Signed)
Pt is having really bad ear pain and thinks she may have a sinus infection as well there are no appts available until Monday she needed to be seen sooner are see about getting some medication for this issue.

## 2023-09-01 ENCOUNTER — Encounter: Payer: Self-pay | Admitting: Internal Medicine

## 2023-09-01 NOTE — Progress Notes (Signed)
   Subjective:    Patient ID: Courtney Ryan, female    DOB: 12-14-1945, 77 y.o.   MRN: [id number]  HPI Patient seen today with acute right ear pain. Denies any acute respiratory infection. Has had ear pain from time to time. Had lower respiratory infection November 20,2023. Has had pneumococcal 20 vaccine.No recent Covid booster.Seen for 3 month check in  mid October and had cough then. She is on no BP meds that would cause cough. Hx of allergies to dust mites and mold.Hx of dependent edema.  History of hypothyroidism treated with thyroid replacement medication.  History of goiter.  Remote history of parathyroid adenoma removed by Dr. Gerrit Friends in June 2020.  History of impaired glucose tolerance.  History of osteoarthritis, osteopenia, vitamin D deficiency, migraine headaches, dependent edema and fibrocystic breast disease.    Review of Systems no other complaints     Objective:   Physical Exam  Vital signs reviewed.  Temperature 97.9 degrees ,pulse 96% ,weight 151 pounds, blood pressure 110/60, pulse 61 and regular  Skin: Warm and dry.  No cervical adenopathy.  TMs appear to be fairly clear and are not red or full.  Neck supple.  Chest clear.  She is not tachypneic and in no acute distress.  No thyromegaly.  Cardiac exam: Regular rate and rhythm without ectopy.      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  Plan: Given her history of allergic rhinitis and her symptoms with the holiday coming I think it is prudent to go ahead and put her on antibiotic treatment.  Prescribed Levaquin 500 milligrams daily for 7 days and Hycodan 1 teaspoon every 8 hours as needed for cough.  25 minutes spent addressing patient concerns and reviewing chart as well as medical decision making and E scribing  medication.

## 2023-09-01 NOTE — Patient Instructions (Signed)
She is symptomatic with right ear pain but does not appear to have an acute bacterial otitis media.  We are going to go ahead and treat her though with Levaquin 500 milligrams daily for 7 days as this generally works well for her given the holiday that is coming.  She will take Hycodan 1 teaspoon every 8 hours if needed for cough.  She may have an element of allergic rhinitis with postnasal drip causing an acute early otitis media.

## 2023-09-08 ENCOUNTER — Other Ambulatory Visit: Payer: Self-pay | Admitting: Internal Medicine

## 2023-10-26 ENCOUNTER — Other Ambulatory Visit: Payer: Medicare Other

## 2023-10-26 DIAGNOSIS — E039 Hypothyroidism, unspecified: Secondary | ICD-10-CM

## 2023-10-26 DIAGNOSIS — I10 Essential (primary) hypertension: Secondary | ICD-10-CM

## 2023-10-26 DIAGNOSIS — Z0000 Encounter for general adult medical examination without abnormal findings: Secondary | ICD-10-CM

## 2023-10-26 DIAGNOSIS — E1169 Type 2 diabetes mellitus with other specified complication: Secondary | ICD-10-CM

## 2023-10-26 DIAGNOSIS — E119 Type 2 diabetes mellitus without complications: Secondary | ICD-10-CM

## 2023-10-26 NOTE — Addendum Note (Signed)
Addended by: Gregery Na on: 10/26/2023 09:27 AM   Modules accepted: Orders

## 2023-10-30 ENCOUNTER — Other Ambulatory Visit: Payer: Medicare Other

## 2023-10-30 DIAGNOSIS — F411 Generalized anxiety disorder: Secondary | ICD-10-CM

## 2023-10-30 DIAGNOSIS — Z0000 Encounter for general adult medical examination without abnormal findings: Secondary | ICD-10-CM

## 2023-10-30 DIAGNOSIS — E039 Hypothyroidism, unspecified: Secondary | ICD-10-CM

## 2023-10-30 DIAGNOSIS — E1169 Type 2 diabetes mellitus with other specified complication: Secondary | ICD-10-CM

## 2023-10-30 DIAGNOSIS — I10 Essential (primary) hypertension: Secondary | ICD-10-CM

## 2023-10-30 DIAGNOSIS — E119 Type 2 diabetes mellitus without complications: Secondary | ICD-10-CM

## 2023-10-31 ENCOUNTER — Ambulatory Visit (INDEPENDENT_AMBULATORY_CARE_PROVIDER_SITE_OTHER): Payer: Medicare Other | Admitting: Internal Medicine

## 2023-10-31 ENCOUNTER — Encounter: Payer: Self-pay | Admitting: Internal Medicine

## 2023-10-31 VITALS — BP 130/80 | HR 66 | Ht 62.75 in | Wt 160.0 lb

## 2023-10-31 DIAGNOSIS — K439 Ventral hernia without obstruction or gangrene: Secondary | ICD-10-CM

## 2023-10-31 DIAGNOSIS — E039 Hypothyroidism, unspecified: Secondary | ICD-10-CM | POA: Diagnosis not present

## 2023-10-31 DIAGNOSIS — Z8639 Personal history of other endocrine, nutritional and metabolic disease: Secondary | ICD-10-CM

## 2023-10-31 DIAGNOSIS — E785 Hyperlipidemia, unspecified: Secondary | ICD-10-CM

## 2023-10-31 DIAGNOSIS — E1169 Type 2 diabetes mellitus with other specified complication: Secondary | ICD-10-CM

## 2023-10-31 DIAGNOSIS — E739 Lactose intolerance, unspecified: Secondary | ICD-10-CM

## 2023-10-31 DIAGNOSIS — Z0000 Encounter for general adult medical examination without abnormal findings: Secondary | ICD-10-CM

## 2023-10-31 DIAGNOSIS — Z860101 Personal history of adenomatous and serrated colon polyps: Secondary | ICD-10-CM

## 2023-10-31 DIAGNOSIS — G609 Hereditary and idiopathic neuropathy, unspecified: Secondary | ICD-10-CM

## 2023-10-31 DIAGNOSIS — F411 Generalized anxiety disorder: Secondary | ICD-10-CM

## 2023-10-31 DIAGNOSIS — I10 Essential (primary) hypertension: Secondary | ICD-10-CM

## 2023-10-31 DIAGNOSIS — K580 Irritable bowel syndrome with diarrhea: Secondary | ICD-10-CM

## 2023-10-31 LAB — HEPATIC FUNCTION PANEL
AG Ratio: 1.5 (calc) (ref 1.0–2.5)
ALT: 11 U/L (ref 6–29)
AST: 13 U/L (ref 10–35)
Albumin: 4.1 g/dL (ref 3.6–5.1)
Alkaline phosphatase (APISO): 86 U/L (ref 37–153)
Bilirubin, Direct: 0.1 mg/dL (ref 0.0–0.2)
Globulin: 2.7 g/dL (ref 1.9–3.7)
Indirect Bilirubin: 0.2 mg/dL (ref 0.2–1.2)
Total Bilirubin: 0.3 mg/dL (ref 0.2–1.2)
Total Protein: 6.8 g/dL (ref 6.1–8.1)

## 2023-10-31 LAB — CBC WITH DIFFERENTIAL/PLATELET
Absolute Lymphocytes: 1768 {cells}/uL (ref 850–3900)
Absolute Monocytes: 608 {cells}/uL (ref 200–950)
Basophils Absolute: 64 {cells}/uL (ref 0–200)
Basophils Relative: 0.8 %
Eosinophils Absolute: 72 {cells}/uL (ref 15–500)
Eosinophils Relative: 0.9 %
HCT: 40 % (ref 35.0–45.0)
Hemoglobin: 13.1 g/dL (ref 11.7–15.5)
MCH: 28.5 pg (ref 27.0–33.0)
MCHC: 32.8 g/dL (ref 32.0–36.0)
MCV: 87 fL (ref 80.0–100.0)
MPV: 9.8 fL (ref 7.5–12.5)
Monocytes Relative: 7.6 %
Neutro Abs: 5488 {cells}/uL (ref 1500–7800)
Neutrophils Relative %: 68.6 %
Platelets: 282 10*3/uL (ref 140–400)
RBC: 4.6 10*6/uL (ref 3.80–5.10)
RDW: 14.6 % (ref 11.0–15.0)
Total Lymphocyte: 22.1 %
WBC: 8 10*3/uL (ref 3.8–10.8)

## 2023-10-31 LAB — TSH: TSH: 0.65 m[IU]/L (ref 0.40–4.50)

## 2023-10-31 LAB — COMPLETE METABOLIC PANEL WITH GFR
AG Ratio: 1.5 (calc) (ref 1.0–2.5)
ALT: 11 U/L (ref 6–29)
AST: 13 U/L (ref 10–35)
Albumin: 4.1 g/dL (ref 3.6–5.1)
Alkaline phosphatase (APISO): 86 U/L (ref 37–153)
BUN: 18 mg/dL (ref 7–25)
CO2: 29 mmol/L (ref 20–32)
Calcium: 9.4 mg/dL (ref 8.6–10.4)
Chloride: 105 mmol/L (ref 98–110)
Creat: 1 mg/dL (ref 0.60–1.00)
Globulin: 2.7 g/dL (ref 1.9–3.7)
Glucose, Bld: 95 mg/dL (ref 65–99)
Potassium: 4.6 mmol/L (ref 3.5–5.3)
Sodium: 142 mmol/L (ref 135–146)
Total Bilirubin: 0.3 mg/dL (ref 0.2–1.2)
Total Protein: 6.8 g/dL (ref 6.1–8.1)
eGFR: 57 mL/min/{1.73_m2} — ABNORMAL LOW (ref >=60)

## 2023-10-31 LAB — MICROALBUMIN / CREATININE URINE RATIO
Creatinine, Urine: 199 mg/dL (ref 20–275)
Microalb Creat Ratio: 6 mg/g{creat} (ref <30)
Microalb, Ur: 1.1 mg/dL

## 2023-10-31 LAB — HEMOGLOBIN A1C
Hgb A1c MFr Bld: 6.8 %{Hb} — ABNORMAL HIGH (ref <5.7)
Mean Plasma Glucose: 148 mg/dL
eAG (mmol/L): 8.2 mmol/L

## 2023-10-31 NOTE — Patient Instructions (Addendum)
Check Hgb AIC in May. Continue same dose of Actos. Try to walk some and cut down on bread consumption.Handicapped parking permit signed. Labs reviewed.

## 2023-10-31 NOTE — Progress Notes (Signed)
Annual Wellness Visit   Patient Care Team: Melodye Swor, Luanna Cole, MD as PCP - General (Internal Medicine) Gean Birchwood, [facility], [facility]  Visit Date: 10/31/23   Chief Complaint  Patient presents with   Medicare Wellness   Annual Exam   Subjective:  Patient: Courtney Ryan, Female DOB: 01/28/1946, 78 y.o. MRN: [id number]  Courtney Ryan is a 78 y.o. Female who presents today for her Annual Wellness Visit. Patient has history of Essential HTN, DM type II, HLD, Hypothyroidism, Hx of Hyperparathyroidism.  Expresses that she may be intolerant to lactose as she has noticed some gastric bloating and softened stools after eating any cheese. Does drink lactose-free milk. Recommended to either take a Lactaid prior to consuming dairy or to avoid dairy in general.   Mentions that she is noticing a change in her vision. Has an upcoming eye appointment.    History of Hypertension treated with 5 mg Amlodipine daily and 25 mg Atenolol daily. Blood Pressure: normotensive today at 130/80. Dependent Edema treated with 40 mg Lasix daily.   History of Diabetes Mellitus type II treated with 15 mg Actos daily. Notes she does tend to eat more breads and sweets than recommended. 10/30/23 HgbA1c, compared to 07/13/23: 6.8, decreased from 7.0 but still elevated.  History of Hyperlipidemia treated with 10 mg Rosuvastatin daily.   History of Hypothyroidism treated with 75 mcg Levothyroxine daily. 10/30/23 TSH: 0.65  Labs 10/30/23 CBC: WNL CMP, compared to 05/11/23: eGFR 57, increased from 50 but still low; otherwise WNL Hepatic Function Panel: WNL Microalbumin/Creatinine: WNL  PAP Smear 07/02/13 normal. Discontinued due to age.   Mammogram 07/11/23 with stable likely benign masses in both breasts. Repeat 2025.  Bone Density 04/07/22 T-score -1.7. Repeat 2025.   Colonoscopy 06/09/21 at [facility] by Dr. Loreta Ave with 3 polyps removed - one 6 mm sessile in proximal sigmoid colon and two 8-9 mm sessile polyps in  transverse colon. Discontinued due to age.   Vaccine Counseling: Due for Shingles; UTD on Tdap and PNA Past Medical History:  Diagnosis Date   Anxiety    takes xanax as needed   Arthritis    bottom of spine and end of fingers   Bursitis of both knees    Bursitis of left hip    Cancer (HCC) 01/31/2022   Skin Cancer - Basal Cell Carcinoma   COPD (chronic obstructive pulmonary disease) (HCC)    test done 15 years ago "had the beginnings of COPD"   Depression    Fibrocystic breast disease    Goiter    Hyperlipidemia    Hypertension 2008   Dr. Lenord Fellers   Osteopenia    Polyp in anterior nares    just in Rt nostral   Vitamin D deficiency   Medical/Surgical History Narrative:  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  Also history of hypothyroidism with goiter.  History of dependent edema, fibrocystic breast disease, history of migraine headaches, osteoarthritis, osteopenia and vitamin D deficiency.  She had COVID-19 in November 2022. Confirmed via phone call that she had flu vaccine October 07, 2022.  Had pneumococcal 20 vaccine in June 2023. In November, had respiratory infection treated with Levaquin and Depomedrol.  She saw Dr. Magnus Ivan in December 2017 for trochanteric bursitis of left hip.  X-ray was unremarkable.  In March 2018 she saw Dr. Merlyn Lot for trigger finger of right thumb.  In 2017 she was  complaining of right groin and right lower quadrant abdominal pain.  CT of the abdomen and pelvis performed showing fatty liver and a ventral hernia containing only fat.  She had a left adrenal adenoma.  She had aortoiliac atherosclerosis.  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  No demonstrable mass or adenopathy in the abdomen.   History of irritable bowel syndrome.  History of bacterial vaginosis.   Had an ovary removed due to a cyst in 1968.  In the 1970s she had abnormal nevus  removed from her left calf.  She had colonoscopy in 2006 in [place].  Right breast biopsy January 2009 which was benign. Family History  Problem Relation Age of Onset   Liver cancer Mother    Breast cancer Neg Hx   Family History Narrative: Father died at age 28 of Lung Cancer.   Mother died at age 41 with Liver Cancer in 1963. Social History   Social History Narrative   Social history: She is a widow.  Husband died several months ago of esophageal cancer.  She smokes about 5 cigarettes daily and drinks a couple glasses of wine a week.  Has good friends and neighbors.   Review of Systems  Constitutional:  Negative for chills, fever, malaise/fatigue and weight loss.  HENT:  Negative for hearing loss, sinus pain and sore throat.   Eyes:        (+) Vision changes  Respiratory:  Negative for cough, hemoptysis and shortness of breath.   Cardiovascular:  Negative for chest pain, palpitations, leg swelling and PND.  Gastrointestinal:  Negative for abdominal pain, constipation, diarrhea, heartburn, nausea and vomiting.       (+) bloating (+) loose stools  Genitourinary:  Negative for dysuria, frequency and urgency.  Musculoskeletal:  Negative for back pain, myalgias and neck pain.  Skin:  Negative for itching and rash.  Neurological:  Negative for dizziness, tingling, seizures and headaches.  Endo/Heme/Allergies:  Positive for polydipsia.  Psychiatric/Behavioral:  Negative for depression. The patient is not nervous/anxious.     Objective:  Vitals: BP 130/80   Pulse 66   Ht 5' 2.75" (1.594 m)   Wt 160 lb (72.6 kg)   SpO2 95%   BMI 28.57 kg/m  Physical Exam Vitals and nursing note reviewed.  Constitutional:      General: She is not in acute distress.    Appearance: Normal appearance. She is not ill-appearing or toxic-appearing.  HENT:     Head: Normocephalic and atraumatic.     Right Ear: Hearing, tympanic membrane, ear canal and external ear normal.     Left Ear: Hearing,  tympanic membrane, ear canal and external ear normal.     Ears:     Comments: Left Ear: some cerumen in canal but TM is clear    Mouth/Throat:     Pharynx: Oropharynx is clear.  Eyes:     Extraocular Movements: Extraocular movements intact.     Pupils: Pupils are equal, round, and reactive to light.  Neck:     Thyroid: No thyroid mass, thyromegaly or thyroid tenderness.     Vascular: No carotid bruit.  Cardiovascular:     Rate and Rhythm: Normal rate and regular rhythm. No extrasystoles are present.    Pulses:          Dorsalis pedis pulses are 1+ on the right side and 1+ on the left side.     Heart sounds: Normal heart sounds. No murmur heard.    No friction rub. No gallop.  Comments: Superficial varicosities noted on lower extremities Pulmonary:     Effort: Pulmonary effort is normal.     Breath sounds: Normal breath sounds. No decreased breath sounds, wheezing, rhonchi or rales.  Chest:     Chest wall: No mass.  Breasts:    Breasts are symmetrical.     Right: No mass.     Left: No mass.     Comments: Fibrocystic changes in breast bilaterally Cherry nevi noted on breasts Seborrheic keratosis noted on right upper anterior trunk Abdominal:     Palpations: Abdomen is soft. There is no hepatomegaly, splenomegaly or mass.     Tenderness: There is no abdominal tenderness.     Hernia: No hernia is present.  Musculoskeletal:     Cervical back: Normal range of motion.     Right lower leg: No edema.     Left lower leg: No edema.  Lymphadenopathy:     Cervical: No cervical adenopathy.     Upper Body:     Right upper body: No supraclavicular adenopathy.     Left upper body: No supraclavicular adenopathy.  Skin:    General: Skin is warm and dry.  Neurological:     General: No focal deficit present.     Mental Status: She is alert and oriented to person, place, and time. Mental status is at baseline.     Sensory: Sensation is intact.     Motor: Motor function is intact. No  weakness.     Deep Tendon Reflexes: Reflexes are normal and symmetric.  Psychiatric:        Attention and Perception: Attention normal.        Mood and Affect: Mood normal.        Speech: Speech normal.        Behavior: Behavior normal.        Thought Content: Thought content normal.        Cognition and Memory: Cognition normal.        Judgment: Judgment normal.   Most Recent Functional Status Assessment:    10/31/2023    2:48 PM  In your present state of health, do you have any difficulty performing the following activities:  Hearing? 0  Vision? 1  Difficulty concentrating or making decisions? 0  Walking or climbing stairs? 1  Dressing or bathing? 0  Doing errands, shopping? 0  Preparing Food and eating ? N  Using the Toilet? N  In the past six months, have you accidently leaked urine? Y  Do you have problems with loss of bowel control? Y  Managing your Medications? N  Managing your Finances? N  Housekeeping or managing your Housekeeping? N   Most Recent Fall Risk Assessment:    10/31/2023    2:48 PM  Fall Risk   Falls in the past year? 0  Number falls in past yr: 0  Injury with Fall? 0  Risk for fall due to : No Fall Risks  Follow up Education provided;Falls evaluation completed;Falls prevention discussed   Most Recent Depression Screenings:    01/26/2023   10:46 AM 01/16/2023    2:43 PM  PHQ 2/9 Scores  PHQ - 2 Score 0 0   Most Recent Cognitive Screening:    10/07/2022    3:19 PM  6CIT Screen  What Year? 0 points  What month? 0 points  What time? 0 points  Count back from 20 0 points  Months in reverse 0 points  Repeat phrase 0 points  Total  Score 0 points   Results:  Studies obtained and personally reviewed by me:  PAP Smear 07/02/13 normal. Discontinued due to age.   Mammogram 07/11/23 with stable likely benign masses in both breasts. Repeat 2025.  Bone Density 04/07/22 T-score -1.7. Repeat 2025.   Colonoscopy 06/09/21 at [facility] by  Dr. Loreta Ave with 3 polyps removed - one 6 mm sessile in proximal sigmoid colon and two 8-9 mm sessile polyps in transverse colon. Discontinued due to age.    Labs:     Component Value Date/Time   NA 142 10/30/2023 0943   NA 141 11/03/2022 0000   K 4.6 10/30/2023 0943   CL 105 10/30/2023 0943   CO2 29 10/30/2023 0943   GLUCOSE 95 10/30/2023 0943   BUN 18 10/30/2023 0943   BUN 24 (A) 11/03/2022 0000   CREATININE 1.00 10/30/2023 0943   CALCIUM 9.4 10/30/2023 0943   PROT 6.8 10/30/2023 0943   PROT 6.8 10/30/2023 0943   ALBUMIN 4.1 08/29/2016 1204   AST 13 10/30/2023 0943   AST 13 10/30/2023 0943   ALT 11 10/30/2023 0943   ALT 11 10/30/2023 0943   ALKPHOS 85 08/29/2016 1204   BILITOT 0.3 10/30/2023 0943   BILITOT 0.3 10/30/2023 0943   GFRNONAA 50 (L) 03/22/2021 0917   GFRAA 58 (L) 03/22/2021 0917    Lab Results  Component Value Date   WBC 8.0 10/30/2023   HGB 13.1 10/30/2023   HCT 40.0 10/30/2023   MCV 87.0 10/30/2023   PLT 282 10/30/2023   Lab Results  Component Value Date   CHOL 129 07/13/2023   HDL 43 (L) 07/13/2023   LDLCALC 61 07/13/2023   TRIG 168 (H) 07/13/2023   CHOLHDL 3.0 07/13/2023   Lab Results  Component Value Date   HGBA1C 6.8 (H) 10/30/2023    Lab Results  Component Value Date   TSH 0.65 10/30/2023    Assessment & Plan:  Other Labs Reviewed today: CBC: WNL CMP, compared to 05/11/23: eGFR 57, increased from 50 but still low; otherwise WNL Hepatic Function Panel: WNL Microalbumin/Creatinine: WNL  Expresses that she may be intolerant to lactose as she has noticed some Gastric Bloating & Softened Stools after eating any cheese. Does drink lactose-free milk. Recommended to either take a Lactaid prior to consuming dairy or to avoid dairy in general.   Mentions that she is noticing Vision Changes. Has an upcoming eye appointment according to pt.    Hypertension treated with 5 mg Amlodipine daily and 25 mg Atenolol daily. Blood Pressure: normotensive today  at 130/80. Dependent Edema treated with 40 mg Lasix daily.   Diabetes Mellitus type II treated with 15 mg Actos daily. Notes she does tend to eat more breads and sweets than recommended. 10/30/23 HgbA1c, compared to 07/13/23: 6.8, decreased from 7.0 but still elevated. Check HgbA1c in May (4 month follow-up). Continue same dose of 15 mg Actos daily.   Hyperlipidemia treated with 10 mg Rosuvastatin daily.   Hypothyroidism treated with 75 mcg Levothyroxine daily. 10/30/23 TSH: 0.65  PAP Smear 07/02/13 normal. Discontinued due to age.   Mammogram 07/11/23 with stable likely benign masses in both breasts. Repeat 2025.  Bone Density 04/07/22 T-score -1.7. Repeat 2025.   Colonoscopy 06/09/21 at [facility] by Dr. Loreta Ave with 3 polyps removed - one 6 mm sessile in proximal sigmoid colon and two 8-9 mm sessile polyps in transverse colon. Discontinued due to age.   Vaccine Counseling: Due for Shingles; UTD on Tdap and  PNA   Annual wellness visit done today including the all of the following: Reviewed patient's Family Medical History Reviewed and updated list of patient's medical providers Assessment of cognitive impairment was done Assessed patient's functional ability Established a written schedule for health screening services Health Risk Assessent Completed and Reviewed  Discussed health benefits of physical activity, and encouraged her to engage in regular exercise appropriate for her age and condition.    I,Emily Lagle,acting as a Neurosurgeon for Margaree Mackintosh, MD.,have documented all relevant documentation on the behalf of Margaree Mackintosh, MD,as directed by  Margaree Mackintosh, MD while in the presence of Margaree Mackintosh, MD.   I, Margaree Mackintosh, MD, have reviewed all documentation for this visit. The documentation on 10/31/23 for the exam, diagnosis, procedures, and orders are all accurate and complete.

## 2024-01-29 ENCOUNTER — Telehealth (INDEPENDENT_AMBULATORY_CARE_PROVIDER_SITE_OTHER): Admitting: Internal Medicine

## 2024-01-29 ENCOUNTER — Encounter: Payer: Self-pay | Admitting: Internal Medicine

## 2024-01-29 VITALS — Temp 98.2°F | Ht 63.0 in | Wt 160.0 lb

## 2024-01-29 DIAGNOSIS — K140 Glossitis: Secondary | ICD-10-CM | POA: Diagnosis not present

## 2024-01-29 DIAGNOSIS — J0100 Acute maxillary sinusitis, unspecified: Secondary | ICD-10-CM

## 2024-01-29 MED ORDER — FLUCONAZOLE 150 MG PO TABS: 150.0000 mg | ORAL_TABLET | Freq: Once | ORAL | 0 refills | Status: AC

## 2024-01-29 MED ORDER — LEVOFLOXACIN 500 MG PO TABS: 500.0000 mg | ORAL_TABLET | Freq: Every day | ORAL | 0 refills | Status: AC

## 2024-01-29 NOTE — Progress Notes (Addendum)
 Patient Care Team: Sylvan Evener, MD as PCP - General (Internal Medicine) [place], [facility]  I connected with Jose Ngo on 01/29/24 at 12:13 PM by video enabled telemedicine visit and verified that I am speaking with the correct person using two identifiers, myself and Ilona Malta, CMA. I am in my office and patient is in their home.    I discussed the limitations, risks, security and privacy concerns of performing an evaluation and management service by telemedicine and the availability of in-person appointments. I also discussed with the patient that there may be a patient responsible charge related to this service. The patient expressed understanding and agreed to proceed.   Other persons participating in the visit and their role in the encounter: Medical scribe, Claudetta Cuba  Patient's location: Home  Provider's location: Clinic   I provided 15 minutes of time spent during this encounter, including chart review, interviewing patient, medical decision making, and e-scribing medication with > 50% was spent counseling as documented under my assessment & plan.   Chief Complaint  Patient presents with   sinus congestion   coated tongue   Subjective:  Patient NW:GNFAO Demauro,Female DOB:May 29, 1946,78 y.o. ZHY:[id number]   78 y.o. Female presents today for acute sick visit with Sinus Congestion; Headache; Coated Tongue. She says that for the past couple of weeks she has had sinus congestion causing a headache and right ear pain, which she attributes to allergies. Says she also has been coughing and noticed her tongue is coated in a yellowish coating despite cleaning and she has a bad taste in her mouth.  Past Medical History:  Diagnosis Date   Anxiety    takes xanax  as needed   Arthritis    bottom of spine and end of fingers   Bursitis of both knees    Bursitis of left hip    Cancer (HCC) 01/31/2022   Skin Cancer - Basal Cell Carcinoma   COPD (chronic  obstructive pulmonary disease) (HCC)    test done 15 years ago "had the beginnings of COPD"   Depression    Fibrocystic breast disease    Goiter    Hyperlipidemia    Hypertension 2008   Dr. Liane Redman   Osteopenia    Polyp in anterior nares    just in Rt nostral   Vitamin D  deficiency     Allergies  Allergen Reactions   Penicillins Nausea And Vomiting   Sulfa Antibiotics Hives and Rash   Ceftriaxone  Other (See Comments)   Levofloxacin     Ofloxacin  Other (See Comments)   Other Other (See Comments)   Clindamycin /Lincomycin Rash   Family History  Problem Relation Age of Onset   Liver cancer Mother    Breast cancer Neg Hx    Social History   Social History Narrative   Social history: She is a widow.  Husband died several months ago of esophageal cancer.  She smokes about 5 cigarettes daily and drinks a couple glasses of wine a week.  Has good friends and neighbors.       Family history: Father died at age 75 of lung cancer.  Mother died at age 21 with cancer of the liver in 1963   Review of Systems  HENT:  Positive for congestion (sinus, contributing to headache, with white drainage), ear pain (right) and sinus pain.        (+) Yellow-ish Coating, Tongue (+) Bad Taste in Mouth  Respiratory:  Positive for cough (no expectoration). Negative for sputum production.  Objective:  Vitals: Temp 98.2 F (36.8 C)   Ht 5\' 3"  (1.6 m)   Wt 160 lb (72.6 kg)   BMI 28.34 kg/m  Physical Exam Vitals and nursing note reviewed.  Constitutional:      General: She is not in acute distress.    Appearance: Normal appearance. She is not toxic-appearing.  HENT:     Head: Normocephalic and atraumatic.  Pulmonary:     Effort: Pulmonary effort is normal.  Skin:    General: Skin is warm and dry.  Neurological:     Mental Status: She is alert and oriented to person, place, and time. Mental status is at baseline.  Psychiatric:        Mood and Affect: Mood normal.        Behavior: Behavior  normal.        Thought Content: Thought content normal.        Judgment: Judgment normal.     Results:  Studies Obtained And Personally Reviewed By Me: Labs:     Component Value Date/Time   NA 142 10/30/2023 0943   NA 141 11/03/2022 0000   K 4.6 10/30/2023 0943   CL 105 10/30/2023 0943   CO2 29 10/30/2023 0943   GLUCOSE 95 10/30/2023 0943   BUN 18 10/30/2023 0943   BUN 24 (A) 11/03/2022 0000   CREATININE 1.00 10/30/2023 0943   CALCIUM  9.4 10/30/2023 0943   PROT 6.8 10/30/2023 0943   PROT 6.8 10/30/2023 0943   ALBUMIN 4.1 08/29/2016 1204   AST 13 10/30/2023 0943   AST 13 10/30/2023 0943   ALT 11 10/30/2023 0943   ALT 11 10/30/2023 0943   ALKPHOS 85 08/29/2016 1204   BILITOT 0.3 10/30/2023 0943   BILITOT 0.3 10/30/2023 0943   GFRNONAA 50 (L) 03/22/2021 0917   GFRAA 58 (L) 03/22/2021 0917    Lab Results  Component Value Date   WBC 8.0 10/30/2023   HGB 13.1 10/30/2023   HCT 40.0 10/30/2023   MCV 87.0 10/30/2023   PLT 282 10/30/2023   Lab Results  Component Value Date   CHOL 129 07/13/2023   HDL 43 (L) 07/13/2023   LDLCALC 61 07/13/2023   TRIG 168 (H) 07/13/2023   CHOLHDL 3.0 07/13/2023   Lab Results  Component Value Date   HGBA1C 6.8 (H) 10/30/2023    Lab Results  Component Value Date   TSH 0.65 10/30/2023    Assessment & Plan:  Acute Maxillary Sinusitis Coated Tongue: for the past couple of weeks she has had sinus congestion causing a headache and right ear pain, which she attributes to allergies. Says she also has been coughing and noticed her tongue is coated in a yellowish coating despite cleaning and she has a bad taste in her mouth. Sending in 500 mg Levaquin  - take 1 tablet daily with a meal for 7 days and 150 mg Diflucan for possible thrush. OK to brush tongue gently with toothbrush     I,Emily Lagle,acting as a scribe for Sylvan Evener, MD.,have documented all relevant documentation on the behalf of Sylvan Evener, MD,as directed by  Sylvan Evener,  MD while in the presence of Sylvan Evener, MD.   I, Sylvan Evener, MD, have reviewed all documentation for this visit. The documentation on 01/29/24 for the exam, diagnosis, procedures, and orders are all accurate and complete.ISylvan Evener, MD, have reviewed all documentation for this visit. The documentation on 01/29/24 for the exam, diagnosis, procedures, and  orders are all accurate and complete.

## 2024-01-29 NOTE — Patient Instructions (Signed)
 We are sorry you are not feeling well today.  Please take Levaquin  500 milligrams daily for 7 days for acute maxillary sinusitis and ear pain.  Please take Diflucan 150 mg tablet for possible thrush/glossitis.  Please call if not better in 7 to 10 days or sooner if worse.  It is okay to brush your tongue gently with a soft toothbrush.

## 2024-02-02 ENCOUNTER — Other Ambulatory Visit: Payer: Self-pay

## 2024-02-02 ENCOUNTER — Other Ambulatory Visit: Payer: Self-pay | Admitting: Internal Medicine

## 2024-02-02 DIAGNOSIS — I10 Essential (primary) hypertension: Secondary | ICD-10-CM

## 2024-02-02 DIAGNOSIS — R609 Edema, unspecified: Secondary | ICD-10-CM

## 2024-02-02 MED ORDER — ATENOLOL 25 MG PO TABS: 25.0000 mg | ORAL_TABLET | Freq: Every day | ORAL | 3 refills | Status: AC

## 2024-02-02 MED ORDER — FUROSEMIDE 40 MG PO TABS: 40.0000 mg | ORAL_TABLET | Freq: Every day | ORAL | 3 refills | Status: AC

## 2024-02-03 ENCOUNTER — Other Ambulatory Visit: Payer: Self-pay | Admitting: Internal Medicine

## 2024-02-29 ENCOUNTER — Other Ambulatory Visit: Payer: Medicare Other

## 2024-02-29 DIAGNOSIS — E1169 Type 2 diabetes mellitus with other specified complication: Secondary | ICD-10-CM

## 2024-02-29 DIAGNOSIS — E119 Type 2 diabetes mellitus without complications: Secondary | ICD-10-CM

## 2024-02-29 LAB — HEMOGLOBIN A1C
Hgb A1c MFr Bld: 6.8 % — ABNORMAL HIGH (ref <5.7)
Mean Plasma Glucose: 148 mg/dL
eAG (mmol/L): 8.2 mmol/L

## 2024-03-01 NOTE — Progress Notes (Addendum)
 ISylvan Evener, MD, have reviewed all documentation for this visit. The documentation on 03/05/24 for the exam, diagnosis, procedures, and orders are all accurate and complete.  Patient Care Team: Sylvan Evener, MD as PCP - General (Internal Medicine) Georgina Kitchen, [facility]  Visit Date: 03/04/24  Subjective:   Chief Complaint  Patient presents with   Diabetes   Patient ZO:XWRUE Stech,Female DOB:1946/02/18,78 y.o. AVW:[id number]   78 y.o.Female presents today for 4 months follow-up for Diabetes Mellitus, type II treated with Actos  15 mg daily. 02/29/2024 HgbA1c 6.8, no change from January. She says that she's noticed increased hunger and thirst since starting Actos .  Past Medical History:  Diagnosis Date   Anxiety    takes xanax  as needed   Arthritis    bottom of spine and end of fingers   Bursitis of both knees    Bursitis of left hip    Cancer (HCC) 01/31/2022   Skin Cancer - Basal Cell Carcinoma   COPD (chronic obstructive pulmonary disease) (HCC)    test done 15 years ago "had the beginnings of COPD"   Depression    Fibrocystic breast disease    Goiter    Hyperlipidemia    Hypertension 2008   Dr. Liane Redman   Osteopenia    Polyp in anterior nares    just in Rt nostral   Vitamin D  deficiency     Allergies  Allergen Reactions   Penicillins Nausea And Vomiting   Sulfa Antibiotics Hives and Rash   Ceftriaxone  Other (See Comments)   Levofloxacin     Ofloxacin  Other (See Comments)   Other Other (See Comments)   Clindamycin /Lincomycin Rash    Family History  Problem Relation Age of Onset   Liver cancer Mother    Breast cancer Neg Hx    Social History   Social History Narrative   Social history: She is a widow.  Husband died several months ago of esophageal cancer.  She smokes about 5 cigarettes daily and drinks a couple glasses of wine a week.  Has good friends and neighbors.       Family history: Father died at age 36 of lung cancer.  Mother died at  age 81 with cancer of the liver in 1963   Review of Systems  Endo/Heme/Allergies:  Positive for polydipsia (since starting Actos ).       (+) Polyphagia since starting Actos      Objective:  Vitals: Ht 5' 2.25" (1.581 m)   Wt 159 lb (72.1 kg)   BMI 28.85 kg/m   Physical Exam Vitals and nursing note reviewed.  Constitutional:      General: She is not in acute distress.    Appearance: Normal appearance. She is not toxic-appearing.  HENT:     Head: Normocephalic and atraumatic.  Cardiovascular:     Rate and Rhythm: Normal rate and regular rhythm. No extrasystoles are present.    Pulses: Normal pulses.     Heart sounds: Normal heart sounds. No murmur heard.    No friction rub. No gallop.  Pulmonary:     Effort: Pulmonary effort is normal. No respiratory distress.     Breath sounds: Normal breath sounds. No wheezing or rales.  Musculoskeletal:     Right lower leg: Edema (trace) present.     Left lower leg: Edema (trace) present.  Skin:    General: Skin is warm and dry.  Neurological:     Mental Status: She is alert and oriented to person, place, and time.  Mental status is at baseline.  Psychiatric:        Mood and Affect: Mood normal.        Behavior: Behavior normal.        Thought Content: Thought content normal.        Judgment: Judgment normal.     Results:  Studies Obtained And Personally Reviewed By Me: Labs:     Component Value Date/Time   NA 142 10/30/2023 0943   NA 141 11/03/2022 0000   K 4.6 10/30/2023 0943   CL 105 10/30/2023 0943   CO2 29 10/30/2023 0943   GLUCOSE 95 10/30/2023 0943   BUN 18 10/30/2023 0943   BUN 24 (A) 11/03/2022 0000   CREATININE 1.00 10/30/2023 0943   CALCIUM  9.4 10/30/2023 0943   PROT 6.8 10/30/2023 0943   PROT 6.8 10/30/2023 0943   ALBUMIN 4.1 08/29/2016 1204   AST 13 10/30/2023 0943   AST 13 10/30/2023 0943   ALT 11 10/30/2023 0943   ALT 11 10/30/2023 0943   ALKPHOS 85 08/29/2016 1204   BILITOT 0.3 10/30/2023 0943   BILITOT  0.3 10/30/2023 0943   GFRNONAA 50 (L) 03/22/2021 0917   GFRAA 58 (L) 03/22/2021 0917    Lab Results  Component Value Date   WBC 8.0 10/30/2023   HGB 13.1 10/30/2023   HCT 40.0 10/30/2023   MCV 87.0 10/30/2023   PLT 282 10/30/2023   Lab Results  Component Value Date   CHOL 129 07/13/2023   HDL 43 (L) 07/13/2023   LDLCALC 61 07/13/2023   TRIG 168 (H) 07/13/2023   CHOLHDL 3.0 07/13/2023   Lab Results  Component Value Date   HGBA1C 6.8 (H) 02/29/2024    Lab Results  Component Value Date   TSH 0.65 10/30/2023   Assessment & Plan:   Diabetes Mellitus, type II treated with Actos  15 mg daily. 02/29/2024 HgbA1c 6.8, no change from January. She has apparently noticed some polyphagia and polydipsia since starting Actos , but does not want to try an alternative at this point in time, so have recommended that she continue this dose and will recheck A1c in 6 weeks  Return in 6 weeks (on 04/12/2024) for repeat A1c and then on 04/15/2024 for 6 week DM, II f/u.    I,Emily Lagle,acting as a Neurosurgeon for Sylvan Evener, MD.,have documented all relevant documentation on the behalf of Sylvan Evener, MD,as directed by  Sylvan Evener, MD while in the presence of Sylvan Evener, MD.   I, Sylvan Evener, MD, have reviewed all documentation for this visit. The documentation on 03/05/24 for the exam, diagnosis, procedures, and orders are all accurate and complete.

## 2024-03-02 ENCOUNTER — Other Ambulatory Visit: Payer: Self-pay | Admitting: Internal Medicine

## 2024-03-04 ENCOUNTER — Encounter: Payer: Self-pay | Admitting: Internal Medicine

## 2024-03-04 ENCOUNTER — Ambulatory Visit: Payer: Medicare Other | Admitting: Internal Medicine

## 2024-03-04 VITALS — Ht 62.25 in | Wt 159.0 lb

## 2024-03-04 DIAGNOSIS — E119 Type 2 diabetes mellitus without complications: Secondary | ICD-10-CM

## 2024-03-04 DIAGNOSIS — Z7984 Long term (current) use of oral hypoglycemic drugs: Secondary | ICD-10-CM

## 2024-03-04 MED ORDER — SITAGLIPTIN PHOSPHATE 50 MG PO TABS: 50.0000 mg | ORAL_TABLET | Freq: Every day | ORAL | 2 refills | Status: DC

## 2024-03-05 ENCOUNTER — Encounter: Payer: Self-pay | Admitting: Internal Medicine

## 2024-03-05 NOTE — Patient Instructions (Addendum)
 Discontinue Pioglitazone . Take Januvia 50 mg daily and return in 6 weeks for follow up.

## 2024-03-30 ENCOUNTER — Other Ambulatory Visit: Payer: Self-pay | Admitting: Internal Medicine

## 2024-04-01 NOTE — Telephone Encounter (Signed)
 Medication: Xanax  Directions:  TAKE 1 TABLET(0.25 MG) BY MOUTH TWICE DAILY AS NEEDED FOR ANXIETY,  Last given: 05/09/2023 Number refills: 5 Last o/v: 03/04/2024 Follow up:  Return in 6 weeks (on 04/12/2024) Labs: 10/30/2023, 02/29/2024

## 2024-04-12 ENCOUNTER — Other Ambulatory Visit

## 2024-04-12 DIAGNOSIS — I10 Essential (primary) hypertension: Secondary | ICD-10-CM

## 2024-04-12 DIAGNOSIS — E1169 Type 2 diabetes mellitus with other specified complication: Secondary | ICD-10-CM

## 2024-04-12 DIAGNOSIS — E119 Type 2 diabetes mellitus without complications: Secondary | ICD-10-CM

## 2024-04-13 LAB — LIPID PANEL
Cholesterol: 143 mg/dL (ref <200)
HDL: 49 mg/dL — ABNORMAL LOW (ref >=50)
LDL Cholesterol (Calc): 71 mg/dL
Non-HDL Cholesterol (Calc): 94 mg/dL (ref <130)
Total CHOL/HDL Ratio: 2.9 (calc) (ref <5.0)
Triglycerides: 156 mg/dL — ABNORMAL HIGH (ref <150)

## 2024-04-13 LAB — HEPATIC FUNCTION PANEL
AG Ratio: 1.4 (calc) (ref 1.0–2.5)
ALT: 11 U/L (ref 6–29)
AST: 12 U/L (ref 10–35)
Albumin: 4.2 g/dL (ref 3.6–5.1)
Alkaline phosphatase (APISO): 89 U/L (ref 37–153)
Bilirubin, Direct: 0.1 mg/dL (ref 0.0–0.2)
Globulin: 2.9 g/dL (ref 1.9–3.7)
Indirect Bilirubin: 0.2 mg/dL (ref 0.2–1.2)
Total Bilirubin: 0.3 mg/dL (ref 0.2–1.2)
Total Protein: 7.1 g/dL (ref 6.1–8.1)

## 2024-04-13 LAB — HEMOGLOBIN A1C
Hgb A1c MFr Bld: 6.7 % — ABNORMAL HIGH (ref <5.7)
Mean Plasma Glucose: 146 mg/dL
eAG (mmol/L): 8.1 mmol/L

## 2024-04-15 ENCOUNTER — Ambulatory Visit: Admitting: Internal Medicine

## 2024-04-15 VITALS — BP 130/70 | HR 67 | Temp 98.2°F | Ht 64.0 in | Wt 159.8 lb

## 2024-04-15 DIAGNOSIS — E785 Hyperlipidemia, unspecified: Secondary | ICD-10-CM | POA: Diagnosis not present

## 2024-04-15 DIAGNOSIS — E1169 Type 2 diabetes mellitus with other specified complication: Secondary | ICD-10-CM

## 2024-04-15 DIAGNOSIS — R053 Chronic cough: Secondary | ICD-10-CM

## 2024-04-15 DIAGNOSIS — G5793 Unspecified mononeuropathy of bilateral lower limbs: Secondary | ICD-10-CM | POA: Diagnosis not present

## 2024-04-15 DIAGNOSIS — I10 Essential (primary) hypertension: Secondary | ICD-10-CM

## 2024-04-15 DIAGNOSIS — E1121 Type 2 diabetes mellitus with diabetic nephropathy: Secondary | ICD-10-CM

## 2024-04-15 DIAGNOSIS — G629 Polyneuropathy, unspecified: Secondary | ICD-10-CM

## 2024-04-15 NOTE — Progress Notes (Signed)
 Patient Care Team: Perri Ronal PARAS, MD as PCP - General (Internal Medicine) [place], [facility]  Visit Date: 04/15/24  Subjective:   Chief Complaint  Patient presents with   Follow-up    Patient is having no side effects. Wants to talk about sinuses.    Diabetes   Patient PI:Courtney Ryan,Female DOB:07-09-1946,77 y.o. FMW:[id number]   78 y.o.Female presents today for 6 week follow-up for Diabetes Mellitus, type II . Patient has a past medical history of Hyperlipidemia treated with Rosuvastatin  10 mg daily, reviewed 04/12/2024 Lipid Panel: HDL 49, elevated from 42; Triglycerides 156, decreased from 189 in 04/2023. Regarding her type II DM, she was switched from Actos  15 mg daily to Januvia  50 mg daily on 03/05/2024. 04/12/2024 HgbA1c 6.7, decreased from 6.8  in May '25. Reviewed 04/12/2024 Hepatic Function Panel: WNL. Does experience peripheral neuropathy in legs.  She additionally c/o of persistence with her sinus issues and cough, for which she was seen in April for. Discussed seeing an Allergist for allergy testing, which she did previously have done through [facility]  in 2016. Skin testing positive for dust mites and mild.  Past Medical History:  Diagnosis Date   Anxiety    takes xanax  as needed   Arthritis    bottom of spine and end of fingers   Bursitis of both knees    Bursitis of left hip    Cancer (HCC) 01/31/2022   Skin Cancer - Basal Cell Carcinoma   COPD (chronic obstructive pulmonary disease) (HCC)    test done 15 years ago had the beginnings of COPD   Depression    Fibrocystic breast disease    Goiter    Hyperlipidemia    Hypertension 2008   Dr. Perri   Osteopenia    Polyp in anterior nares    just in Rt nostral   Vitamin D  deficiency     Allergies  Allergen Reactions   Penicillins Nausea And Vomiting   Sulfa Antibiotics Hives and Rash   Ceftriaxone  Other (See Comments)   Levofloxacin     Ofloxacin  Other (See  Comments)   Other Other (See Comments)   Clindamycin /Lincomycin Rash    Family History  Problem Relation Age of Onset   Liver cancer Mother    Breast cancer Neg Hx    Social History   Social History Narrative   Social history: She is a widow.  Husband died several months ago of esophageal cancer.  She smokes about 5 cigarettes daily and drinks a couple glasses of wine a week.  Has good friends and neighbors.       Family history: Father died at age 74 of lung cancer.  Mother died at age 45 with cancer of the liver in 1963   Review of Systems  HENT:         (+) Sinus Issues  Respiratory:  Positive for cough.      Objective:  Vitals: BP 130/70   Pulse 67   Temp 98.2 F (36.8 C) (Temporal)   Ht 5' 4 (1.626 m)   Wt 159 lb 12.8 oz (72.5 kg)   SpO2 96%   BMI 27.43 kg/m   Physical Exam  Results:  Studies Obtained And Personally Reviewed By Me: Labs:     Component Value Date/Time   NA 142 10/30/2023 0943   NA 141 11/03/2022 0000   K 4.6 10/30/2023 0943   CL 105 10/30/2023 0943   CO2 29 10/30/2023 0943  GLUCOSE 95 10/30/2023 0943   BUN 18 10/30/2023 0943   BUN 24 (A) 11/03/2022 0000   CREATININE 1.00 10/30/2023 0943   CALCIUM  9.4 10/30/2023 0943   PROT 7.1 04/12/2024 0937   ALBUMIN 4.1 08/29/2016 1204   AST 12 04/12/2024 0937   ALT 11 04/12/2024 0937   ALKPHOS 85 08/29/2016 1204   BILITOT 0.3 04/12/2024 0937   GFRNONAA 50 (L) 03/22/2021 0917   GFRAA 58 (L) 03/22/2021 0917    Lab Results  Component Value Date   WBC 8.0 10/30/2023   HGB 13.1 10/30/2023   HCT 40.0 10/30/2023   MCV 87.0 10/30/2023   PLT 282 10/30/2023   Lab Results  Component Value Date   CHOL 143 04/12/2024   HDL 49 (L) 04/12/2024   LDLCALC 71 04/12/2024   TRIG 156 (H) 04/12/2024   CHOLHDL 2.9 04/12/2024   Lab Results  Component Value Date   HGBA1C 6.7 (H) 04/12/2024    Lab Results  Component Value Date   TSH 0.65 10/30/2023    Assessment & Plan:   Orders Placed This Encounter   Procedures   Ambulatory referral to Neurology   Diabetes Mellitus, type II: treated with Januvia  50 mg daily on 03/05/2024. 04/12/2024 HgbA1c 6.7, decreased from 6.8 in May '25. Reviewed 04/12/2024 Hepatic Function Panel: WNL.   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  Hyperlipidemia treated with Rosuvastatin  10 mg daily. Reviewed 04/12/2024 Lipid Panel: HDL 49, elevated from 42; Triglycerides 156, decreased from 189 in 04/2023.   Persisting Cough: she was seen initially for this in April. Discussed seeing an Allergist for allergy testing, which she did previously have [facility]. Skin testing positive for dust mites and mild.   Return in about 7 weeks (around 06/04/2024) for 6 month labs and then on 06/07/2024 for 6 month follow-up, or as needed.    I,Emily Lagle,acting as a Neurosurgeon for Ronal JINNY Hailstone, MD.,have documented all relevant documentation on the behalf of Ronal JINNY Hailstone, MD,as directed by  Ronal JINNY Hailstone, MD while in the presence of Ronal JINNY Hailstone, MD.   I, Ronal JINNY Hailstone, MD, have reviewed all documentation for this visit. The documentation on 04/16/24 for the exam, diagnosis, procedures, and orders are all accurate and complete.

## 2024-04-16 ENCOUNTER — Encounter: Payer: Self-pay | Admitting: Internal Medicine

## 2024-04-16 NOTE — Patient Instructions (Addendum)
 Referral to [facility] for protracted cough. Referral to neurology for peripheral neuropathy. Hypertension stable

## 2024-04-22 ENCOUNTER — Encounter: Payer: Self-pay | Admitting: Internal Medicine

## 2024-04-22 ENCOUNTER — Telehealth: Payer: Self-pay

## 2024-04-22 ENCOUNTER — Telehealth: Payer: Self-pay | Admitting: Internal Medicine

## 2024-04-22 DIAGNOSIS — G609 Hereditary and idiopathic neuropathy, unspecified: Secondary | ICD-10-CM

## 2024-04-22 MED ORDER — PIOGLITAZONE HCL 30 MG PO TABS: 30.0000 mg | ORAL_TABLET | Freq: Every day | ORAL | 0 refills | Status: DC

## 2024-04-22 NOTE — Telephone Encounter (Signed)
 I see that Januvia  was discontinued on 04/18/24.  Copied from CRM [id number]. Topic: Clinical - Prescription Issue >> Apr 22, 2024  3:35 PM Courtney Ryan wrote: Reason for CRM: Patient called.[name] looking to refill: januvia  ?  I don't see it on her chart ?

## 2024-04-22 NOTE — Telephone Encounter (Signed)
 I have spoken with her today at length. Referral placed for complaint of peripheral neuropathy to [facility]. A787 was checked Feb 2024 and apparently may already take oral  B12 supplement.  May have diabetic neuropathy .  Also has complaints about both Actos  and Januvia . Does not check accuchecks. Rather than doubling Januvia , might be safer in terms of hypoglycemia risk to double Actos . Sending in Actos  30 mg daily. Stop Januvia . Has follow up here in September.

## 2024-04-22 NOTE — Telephone Encounter (Signed)
 She would like referral to Allergy and Asthma and they have put note in My Chart that she should call them. She does not use My Chart. I have called her and left her the number to call them for Allergy evaluation.  Regarding peripheral neuropathy, I have placed referral to [facility]. B12 level was checked in February and was normal. TSH was checked in February and was normal.  Regarding Diabetes management, She complains of adverse effects on Januvia . Had previously been on Actos . We can double Actos  to 30 mg daily. Has follow up appt here in September. Does not check accuchecks at all. Sending in Rx for Actos  30 mg daily. #90 with no refill.

## 2024-04-25 ENCOUNTER — Telehealth: Payer: Self-pay

## 2024-04-25 NOTE — Telephone Encounter (Signed)
 I called [facility], their first available appointment is in December patient was added to the wait/cancellation list.

## 2024-04-25 NOTE — Telephone Encounter (Signed)
 Copied from CRM [id number]. Topic: Referral - Question >> Apr 24, 2024  4:07 PM Jayma L wrote: Reason for CRM : patient called in and started the  referral that was placed for a neurologist she can't get in till December, asking for a different referral to be placed. Can be female or female but the sooner the better.  Please advise, patient was referred to [facility].

## 2024-04-26 ENCOUNTER — Telehealth: Payer: Self-pay

## 2024-05-02 ENCOUNTER — Encounter: Payer: Self-pay | Admitting: Neurology

## 2024-05-02 ENCOUNTER — Ambulatory Visit: Admitting: Neurology

## 2024-05-02 ENCOUNTER — Other Ambulatory Visit (INDEPENDENT_AMBULATORY_CARE_PROVIDER_SITE_OTHER): Payer: Self-pay

## 2024-05-02 VITALS — BP 147/70 | HR 64 | Ht 64.0 in | Wt 160.0 lb

## 2024-05-02 DIAGNOSIS — G6289 Other specified polyneuropathies: Secondary | ICD-10-CM

## 2024-05-02 DIAGNOSIS — Z0289 Encounter for other administrative examinations: Secondary | ICD-10-CM

## 2024-05-02 DIAGNOSIS — G629 Polyneuropathy, unspecified: Secondary | ICD-10-CM | POA: Insufficient documentation

## 2024-05-02 DIAGNOSIS — M792 Neuralgia and neuritis, unspecified: Secondary | ICD-10-CM | POA: Insufficient documentation

## 2024-05-02 MED ORDER — GABAPENTIN 100 MG PO CAPS: 100.0000 mg | ORAL_CAPSULE | Freq: Three times a day (TID) | ORAL | 11 refills | Status: DC | PRN

## 2024-05-02 NOTE — Progress Notes (Signed)
 Chief Complaint  Patient presents with   New Patient (Initial Visit)    Rm17, alone,  Internal referral for peripheral polyneuropathy: here to establish care    Peripheral Neuropathy    Rm17, alone,  Internal referral for peripheral polyneuropathy: bilateral legs feet have burining/tingly/numness, pt stated swelling feet, ankles, and legs. It has progressively gotten worse in past        ASSESSMENT AND PLAN  Sabena Winner is a 78 y.o. female   Peripheral neuropathy Neuropathic pain  Laboratory evaluation for treatable etiology   Gabapentin  100 mg 3 times a day as needed  Return in 6 months  DIAGNOSTIC DATA (LABS, IMAGING, TESTING) - I reviewed patient records, labs, notes, testing and imaging myself where available.   MEDICAL HISTORY:  Ethlyn Alto is a 78 year old female, seen in request by Dr. Perri Shuck for evaluation of bilateral lower extremity paresthesia initial evaluation May 02, 2024  History is obtained from the patient and review of electronic medical records. I personally reviewed pertinent available imaging films in PACS.   PMHx of  HTN HLD Hypothyrodism DM COPD Smoke    She lives alone at apartment complex, began to notice bilateral feet numbness around 2023, starting at the bottom of her feet, burning stinging sensation, gradually getting worse, 8 out of 10 discomfort, getting worse after bearing weight,  She denies significant low back pain, denied difficulty sleeping  Lab in 2025 A1c was mildly elevated 6.7  PHYSICAL EXAM:   Vitals:   05/02/24 1403 05/02/24 1408  BP: (!) 159/72 (!) 147/70  Pulse: 60 64  SpO2:  94%  Weight: 160 lb (72.6 kg)   Height: 5' 4 (1.626 m)    Body mass index is 27.46 kg/m.  PHYSICAL EXAMNIATION:  Gen: NAD, conversant, well nourised, well groomed                     Cardiovascular: Regular rate rhythm, no peripheral edema, warm, nontender. Eyes: Conjunctivae clear without exudates or hemorrhage Neck: Supple,  no carotid bruits. Pulmonary: Clear to auscultation bilaterally   NEUROLOGICAL EXAM:  MENTAL STATUS: Speech/cognition: Awake, alert, oriented to history taking and casual conversation CRANIAL NERVES: CN II: Visual fields are full to confrontation. Pupils are round equal and briskly reactive to light. CN III, IV, VI: extraocular movement are normal. No ptosis. CN V: Facial sensation is intact to light touch CN VII: Face is symmetric with normal eye closure  CN VIII: Hearing is normal to causal conversation. CN IX, X: Phonation is normal. CN XI: Head turning and shoulder shrug are intact  MOTOR: There is no pronator drift of out-stretched arms. Muscle bulk and tone are normal. Muscle strength is normal.  REFLEXES: Reflexes are 1 and symmetric at the biceps, triceps, knees, and absent at ankles. Plantar responses are flexor.  SENSORY: Mildly length-dependent decreased light touch, vibratory sensation pinprick to below knee level  COORDINATION: There is no trunk or limb dysmetria noted.  GAIT/STANCE: Posture is normal. Gait is mildly antalgic  REVIEW OF SYSTEMS:  Full 14 system review of systems performed and notable only for as above All other review of systems were negative.   ALLERGIES: Allergies  Allergen Reactions   Penicillins Nausea And Vomiting    Other Reaction(s): Unknown   Sulfa Antibiotics Hives and Rash    Other Reaction(s): Unknown   Levofloxacin     Other Other (See Comments)   Ceftriaxone  Other (See Comments) and Dermatitis   Clindamycin /Lincomycin Rash   Dust Mite  Extract Other (See Comments)   Levofloxacin  In D5w Rash and Dermatitis   Molds & Smuts Other (See Comments)   Ofloxacin  Other (See Comments) and Dermatitis    HOME MEDICATIONS: Current Outpatient Medications  Medication Sig Dispense Refill   ALPRAZolam  (XANAX ) 0.25 MG tablet TAKE 1 TABLET(0.25 MG) BY MOUTH TWICE DAILY AS NEEDED FOR ANXIETY 60 tablet 3   amLODipine  (NORVASC ) 5 MG tablet  TAKE 1 TABLET(5 MG) BY MOUTH DAILY 90 tablet 3   aspirin 325 MG tablet Take 650 mg by mouth as needed (pain).     atenolol  (TENORMIN ) 25 MG tablet TAKE 1 TABLET BY MOUTH EVERY DAY 90 tablet 3   atenolol  (TENORMIN ) 25 MG tablet Take 1 tablet (25 mg total) by mouth daily. 90 tablet 3   benzonatate  (TESSALON ) 100 MG capsule TAKE 1 CAPSULE(100 MG) BY MOUTH THREE TIMES DAILY AS NEEDED FOR COUGH 30 capsule 0   conjugated estrogens  (PREMARIN ) vaginal cream INSERT 1 APPLICATORFUL VAGINALLY AT BEDTIME AS NEEDED(CURRENTLY USING TWICE A WEEK) 30 g 11   furosemide  (LASIX ) 40 MG tablet TAKE 1 TABLET(40 MG) BY MOUTH DAILY 90 tablet 3   furosemide  (LASIX ) 40 MG tablet Take 1 tablet (40 mg total) by mouth daily. 90 tablet 3   loratadine (CLARITIN) 10 MG tablet Take 10 mg by mouth daily as needed for allergies.     pioglitazone  (ACTOS ) 30 MG tablet Take 1 tablet (30 mg total) by mouth daily. 90 tablet 0   rosuvastatin  (CRESTOR ) 10 MG tablet TAKE 1 TABLET(10 MG) BY MOUTH DAILY 90 tablet 3   SYNTHROID  75 MCG tablet TAKE 1 TABLET BY MOUTH EVERY DAY 90 tablet 0   No current facility-administered medications for this visit.    PAST MEDICAL HISTORY: Past Medical History:  Diagnosis Date   Anxiety    takes xanax  as needed   Arthritis    bottom of spine and end of fingers   Bursitis of both knees    Bursitis of left hip    Cancer (HCC) 01/31/2022   Skin Cancer - Basal Cell Carcinoma   COPD (chronic obstructive pulmonary disease) (HCC)    test done 15 years ago had the beginnings of COPD   Depression    Fibrocystic breast disease    Goiter    Hyperlipidemia    Hypertension 2008   Dr. Perri   Osteopenia    Polyp in anterior nares    just in Rt nostral   Vitamin D  deficiency     PAST SURGICAL HISTORY: Past Surgical History:  Procedure Laterality Date   MOHS SURGERY  03/03/2022   nose   OOPHORECTOMY     OVARIAN CYST REMOVAL     PARATHYROIDECTOMY N/A 03/07/2019   Procedure: RIGHT INFERIOR  PARATHYROIDECTOMY;  Surgeon: Eletha Boas, MD;  Location: [facility];  Service: General;  Laterality: N/A;    FAMILY HISTORY: Family History  Problem Relation Age of Onset   Liver cancer Mother    Breast cancer Neg Hx     SOCIAL HISTORY: Social History   Socioeconomic History   Marital status: Widowed    Spouse name: Not on file   Number of children: Not on file   Years of education: Not on file   Highest education level: Bachelor's degree (e.g., BA, AB, BS)  Occupational History   Not on file  Tobacco Use   Smoking status: Former    Current packs/day: 0.00    Types: Cigarettes    Quit date: 10/03/2018  Years since quitting: 5.5   Smokeless tobacco: Never  Vaping Use   Vaping status: Never Used  Substance and Sexual Activity   Alcohol use: Yes    Comment: occationally   Drug use: Never   Sexual activity: Not on file  Other Topics Concern   Not on file  Social History Narrative   Social history: She is a widow.  Husband died several months ago of esophageal cancer.  She smokes about 5 cigarettes daily and drinks a couple glasses of wine a week.  Has good friends and neighbors.       Family history: Father died at age 61 of lung cancer.  Mother died at age 23 with cancer of the liver in 63   Social Drivers of Health   Financial Resource Strain: Low Risk  (10/30/2023)   Overall Financial Resource Strain (CARDIA)    Difficulty of Paying Living Expenses: Not very hard  Food Insecurity: No Food Insecurity (10/30/2023)   Hunger Vital Sign    Worried About Running Out of Food in the Last Year: Never true    Ran Out of Food in the Last Year: Never true  Transportation Needs: No Transportation Needs (10/30/2023)   PRAPARE - Administrator, Civil Service (Medical): No    Lack of Transportation (Non-Medical): No  Physical Activity: Inactive (10/30/2023)   Exercise Vital Sign    Days of Exercise per Week: 0 days    Minutes of Exercise per Session: 0 min  Stress: No  Stress Concern Present (10/30/2023)   [employer] of Occupational Health - Occupational Stress Questionnaire    Feeling of Stress : Only a little  Social Connections: Moderately Isolated (10/30/2023)   Social Connection and Isolation Panel    Frequency of Communication with Friends and Family: More than three times a week    Frequency of Social Gatherings with Friends and Family: More than three times a week    Attends Religious Services: Never    Database administrator or Organizations: Yes    Attends Engineer, structural: More than 4 times per year    Marital Status: Widowed  Intimate Partner Violence: Not At Risk (01/29/2024)   Humiliation, Afraid, Rape, and Kick questionnaire    Fear of Current or Ex-Partner: No    Emotionally Abused: No    Physically Abused: No    Sexually Abused: No      Modena Callander, M.D. Ph.D.  [facility] [address] [place], [place] [address] Ph: [phone] Fax: [phone]  CC:  Perri Ronal PARAS, MD [address] [place],  [place] [address]  Perri Ronal PARAS, MD

## 2024-05-06 LAB — MULTIPLE MYELOMA PANEL, SERUM
Albumin SerPl Elph-Mcnc: 3.6 g/dL (ref 2.9–4.4)
Albumin/Glob SerPl: 1.1 (ref 0.7–1.7)
Alpha 1: 0.2 g/dL (ref 0.0–0.4)
Alpha2 Glob SerPl Elph-Mcnc: 0.9 g/dL (ref 0.4–1.0)
B-Globulin SerPl Elph-Mcnc: 1.3 g/dL (ref 0.7–1.3)
Gamma Glob SerPl Elph-Mcnc: 1 g/dL (ref 0.4–1.8)
Globulin, Total: 3.4 g/dL (ref 2.2–3.9)
IgA/Immunoglobulin A, Serum: 258 mg/dL (ref 64–422)
IgG (Immunoglobin G), Serum: 1112 mg/dL (ref 586–1602)
IgM (Immunoglobulin M), Srm: 91 mg/dL (ref 26–217)

## 2024-05-06 LAB — CBC WITH DIFFERENTIAL/PLATELET
Basophils Absolute: 0.1 x10E3/uL (ref 0.0–0.2)
Basos: 1 %
EOS (ABSOLUTE): 0 x10E3/uL (ref 0.0–0.4)
Eos: 0 %
Hematocrit: 41.3 % (ref 34.0–46.6)
Hemoglobin: 13.3 g/dL (ref 11.1–15.9)
Immature Grans (Abs): 0 x10E3/uL (ref 0.0–0.1)
Immature Granulocytes: 0 %
Lymphocytes Absolute: 1.8 x10E3/uL (ref 0.7–3.1)
Lymphs: 18 %
MCH: 28.3 pg (ref 26.6–33.0)
MCHC: 32.2 g/dL (ref 31.5–35.7)
MCV: 88 fL (ref 79–97)
Monocytes Absolute: 0.8 x10E3/uL (ref 0.1–0.9)
Monocytes: 8 %
Neutrophils Absolute: 7.2 x10E3/uL — ABNORMAL HIGH (ref 1.4–7.0)
Neutrophils: 73 %
Platelets: 295 x10E3/uL (ref 150–450)
RBC: 4.7 x10E6/uL (ref 3.77–5.28)
RDW: 14.6 % (ref 11.7–15.4)
WBC: 10 x10E3/uL (ref 3.4–10.8)

## 2024-05-06 LAB — COMPREHENSIVE METABOLIC PANEL WITH GFR
ALT: 11 IU/L (ref 0–32)
AST: 13 IU/L (ref 0–40)
Albumin: 4.1 g/dL (ref 3.8–4.8)
Alkaline Phosphatase: 98 IU/L (ref 44–121)
BUN/Creatinine Ratio: 21 (ref 12–28)
BUN: 21 mg/dL (ref 8–27)
Bilirubin Total: <0.2 mg/dL (ref 0.0–1.2)
CO2: 19 mmol/L — ABNORMAL LOW (ref 20–29)
Calcium: 9.6 mg/dL (ref 8.7–10.3)
Chloride: 104 mmol/L (ref 96–106)
Creatinine, Ser: 0.98 mg/dL (ref 0.57–1.00)
Globulin, Total: 2.9 g/dL (ref 1.5–4.5)
Glucose: 90 mg/dL (ref 70–99)
Potassium: 4.6 mmol/L (ref 3.5–5.2)
Sodium: 142 mmol/L (ref 134–144)
Total Protein: 7 g/dL (ref 6.0–8.5)
eGFR: 59 mL/min/1.73 — ABNORMAL LOW (ref >59)

## 2024-05-06 LAB — SEDIMENTATION RATE: Sed Rate: 14 mm/h (ref 0–40)

## 2024-05-06 LAB — FOLATE: Folate: 8.1 ng/mL (ref >3.0)

## 2024-05-06 LAB — RPR: RPR Ser Ql: NONREACTIVE

## 2024-05-06 LAB — TSH: TSH: 0.869 u[IU]/mL (ref 0.450–4.500)

## 2024-05-06 LAB — VITAMIN B12: Vitamin B-12: 894 pg/mL (ref 232–1245)

## 2024-05-06 LAB — ANA W/REFLEX IF POSITIVE: Anti Nuclear Antibody (ANA): NEGATIVE

## 2024-05-06 LAB — C-REACTIVE PROTEIN: CRP: 11 mg/L — ABNORMAL HIGH (ref 0–10)

## 2024-05-08 ENCOUNTER — Ambulatory Visit: Payer: Self-pay | Admitting: Neurology

## 2024-05-12 ENCOUNTER — Other Ambulatory Visit: Payer: Self-pay | Admitting: Internal Medicine

## 2024-05-20 ENCOUNTER — Ambulatory Visit: Admitting: Allergy

## 2024-05-20 NOTE — Progress Notes (Deleted)
 New Patient Note  RE: Courtney Ryan MRN: [id number] DOB: 1946/03/19 Date of Office Visit: 05/20/2024  Consult requested by: Perri Ronal PARAS, MD Primary care provider: Perri Ronal PARAS, MD  Chief Complaint: No chief complaint on file.  History of Present Illness: I had the pleasure of seeing Courtney Ryan for initial evaluation at the [facility] on 05/20/2024. She is a 78 y.o. female, who is referred here by Perri Ronal PARAS, MD for the evaluation of cough.  Discussed the use of AI scribe software for clinical note transcription with the patient, who gave verbal consent to proceed.  History of Present Illness             She reports symptoms of *** chest tightness, shortness of breath, coughing, wheezing, nocturnal awakenings for *** years. Current medications include *** which help. She reports *** using aerochamber with inhalers. She tried the following inhalers: ***. Main triggers are ***allergies, infections, weather changes, smoke, exercise, pet exposure. In the last month, frequency of symptoms: ***x/week. Frequency of nocturnal symptoms: ***x/month. Frequency of SABA use: ***x/week. Interference with physical activity: ***. Sleep is ***disturbed. In the last 12 months, emergency room visits/urgent care visits/doctor office visits or hospitalizations due to respiratory issues: ***. In the last 12 months, oral steroids courses: ***. Lifetime history of hospitalization for respiratory issues: ***. Prior intubations: ***. Asthma was diagnosed at age *** by ***. History of pneumonia: ***. She was evaluated by allergist ***pulmonologist in the past. Smoking exposure: ***. Up to date with flu vaccine: ***. Up to date with pneumonia vaccine: ***. Up to date with COVID-19 vaccine: ***. Prior Covid-19 infection: ***. History of reflux: ***.   04/15/2024 PCP visit: She additionally c/o of persistence with her sinus issues and cough, for which she was seen in April for. Discussed  seeing an Allergist for allergy testing, which she did previously have done through [facility]  in 2016. Skin testing positive for dust mites and mild.  04/11/2022 chest x-ray: IMPRESSION: There are no signs of pulmonary edema or new focal pulmonary consolidation. Linear density at the left cardiophrenic angle may suggest scarring or subsegmental atelectasis with no significant interval change since 06/10/2020.  Assessment and Plan: Courtney Ryan is a 78 y.o. female with: ***  Assessment and Plan               No follow-ups on file.  No orders of the defined types were placed in this encounter.  Lab Orders  No laboratory test(s) ordered today    Other allergy screening: Asthma: {Blank single:19197::yes,no} Rhino conjunctivitis: {Blank single:19197::yes,no} Food allergy: {Blank single:19197::yes,no} Medication allergy: {Blank single:19197::yes,no} Hymenoptera allergy: {Blank single:19197::yes,no} Urticaria: {Blank single:19197::yes,no} Eczema:{Blank single:19197::yes,no} History of recurrent infections suggestive of immunodeficency: {Blank single:19197::yes,no}  Diagnostics: Spirometry:  Tracings reviewed. Her effort: {Blank single:19197::Good reproducible efforts.,It was hard to get consistent efforts and there is a question as to whether this reflects a maximal maneuver.,Poor effort, data can not be interpreted.} FVC: ***L FEV1: ***L, ***% predicted FEV1/FVC ratio: ***% Interpretation: {Blank single:19197::Spirometry consistent with mild obstructive disease,Spirometry consistent with moderate obstructive disease,Spirometry consistent with severe obstructive disease,Spirometry consistent with possible restrictive disease,Spirometry consistent with mixed obstructive and restrictive disease,Spirometry uninterpretable due to technique,Spirometry consistent with normal pattern,No overt  abnormalities noted given today's efforts}.  Please see scanned spirometry results for details.  Skin Testing: {Blank single:19197::Select foods,Environmental allergy panel,Environmental allergy panel and select foods,Food allergy panel,None,Deferred due to recent antihistamines use}. *** Results discussed with patient/family.  Past Medical History: Patient Active Problem List   Diagnosis Date Noted   Peripheral neuropathy 05/02/2024   Neuropathic pain 05/02/2024   Trigger finger of right thumb 12/21/2016   Intramural and subserous leiomyoma of uterus 10/01/2016   Deviated nasal septum 12/09/2015   Sinusitis, chronic 11/29/2015   Controlled diabetes mellitus type II without complication (HCC) 05/26/2014   Dependent edema 04/24/2014   Hyperlipidemia 06/16/2011   Hypothyroidism 06/16/2011   Osteoarthritis 06/16/2011   Goiter 06/16/2011   Fibrocystic breast disease 06/16/2011   Osteopenia 06/16/2011   Vitamin D  deficiency 06/16/2011   Hypertension 06/16/2011   Migraine headache 06/16/2011   Past Medical History:  Diagnosis Date   Anxiety    takes xanax  as needed   Arthritis    bottom of spine and end of fingers   Bursitis of both knees    Bursitis of left hip    Cancer (HCC) 01/31/2022   Skin Cancer - Basal Cell Carcinoma   COPD (chronic obstructive pulmonary disease) (HCC)    test done 15 years ago had the beginnings of COPD   Depression    Fibrocystic breast disease    Goiter    Hyperlipidemia    Hypertension 2008   Dr. Perri   Osteopenia    Polyp in anterior nares    just in Rt nostral   Vitamin D  deficiency    Past Surgical History: Past Surgical History:  Procedure Laterality Date   MOHS SURGERY  03/03/2022   nose   OOPHORECTOMY     OVARIAN CYST REMOVAL     PARATHYROIDECTOMY N/A 03/07/2019   Procedure: RIGHT INFERIOR PARATHYROIDECTOMY;  Surgeon: Eletha Boas, MD;  Location: [facility];  Service: General;  Laterality: N/A;   Medication List:   Current Outpatient Medications  Medication Sig Dispense Refill   ALPRAZolam  (XANAX ) 0.25 MG tablet TAKE 1 TABLET(0.25 MG) BY MOUTH TWICE DAILY AS NEEDED FOR ANXIETY 60 tablet 3   amLODipine  (NORVASC ) 5 MG tablet TAKE 1 TABLET(5 MG) BY MOUTH DAILY 90 tablet 3   aspirin 325 MG tablet Take 650 mg by mouth as needed (pain).     atenolol  (TENORMIN ) 25 MG tablet TAKE 1 TABLET BY MOUTH EVERY DAY 90 tablet 3   atenolol  (TENORMIN ) 25 MG tablet Take 1 tablet (25 mg total) by mouth daily. 90 tablet 3   benzonatate  (TESSALON ) 100 MG capsule TAKE 1 CAPSULE(100 MG) BY MOUTH THREE TIMES DAILY AS NEEDED FOR COUGH 30 capsule 0   conjugated estrogens  (PREMARIN ) vaginal cream INSERT 1 APPLICATORFUL VAGINALLY AT BEDTIME AS NEEDED 30 g 11   furosemide  (LASIX ) 40 MG tablet TAKE 1 TABLET(40 MG) BY MOUTH DAILY 90 tablet 3   furosemide  (LASIX ) 40 MG tablet Take 1 tablet (40 mg total) by mouth daily. 90 tablet 3   gabapentin  (NEURONTIN ) 100 MG capsule Take 1 capsule (100 mg total) by mouth 3 (three) times daily as needed. 90 capsule 11   loratadine (CLARITIN) 10 MG tablet Take 10 mg by mouth daily as needed for allergies.     pioglitazone  (ACTOS ) 30 MG tablet Take 1 tablet (30 mg total) by mouth daily. 90 tablet 0   rosuvastatin  (CRESTOR ) 10 MG tablet TAKE 1 TABLET(10 MG) BY MOUTH DAILY 90 tablet 3   SYNTHROID  75 MCG tablet TAKE 1 TABLET BY MOUTH EVERY DAY 90 tablet 0   No current facility-administered medications for this visit.   Allergies: Allergies  Allergen Reactions   Penicillins Nausea And Vomiting    Other Reaction(s): Unknown  Sulfa Antibiotics Hives and Rash    Other Reaction(s): Unknown   Levofloxacin     Other Other (See Comments)   Ceftriaxone  Other (See Comments) and Dermatitis   Clindamycin /Lincomycin Rash   Dust Mite Extract Other (See Comments)   Levofloxacin  In D5w Rash and Dermatitis   Molds & Smuts Other (See Comments)   Ofloxacin  Other (See Comments) and Dermatitis   Social  History: Social History   Socioeconomic History   Marital status: Widowed    Spouse name: Not on file   Number of children: Not on file   Years of education: Not on file   Highest education level: Bachelor's degree (e.g., BA, AB, BS)  Occupational History   Not on file  Tobacco Use   Smoking status: Former    Current packs/day: 0.00    Types: Cigarettes    Quit date: 10/03/2018    Years since quitting: 5.6   Smokeless tobacco: Never  Vaping Use   Vaping status: Never Used  Substance and Sexual Activity   Alcohol use: Yes    Comment: occationally   Drug use: Never   Sexual activity: Not on file  Other Topics Concern   Not on file  Social History Narrative   Social history: She is a widow.  Husband died several months ago of esophageal cancer.  She smokes about 5 cigarettes daily and drinks a couple glasses of wine a week.  Has good friends and neighbors.       Family history: Father died at age 48 of lung cancer.  Mother died at age 68 with cancer of the liver in 69   Social Drivers of Health   Financial Resource Strain: Low Risk  (10/30/2023)   Overall Financial Resource Strain (CARDIA)    Difficulty of Paying Living Expenses: Not very hard  Food Insecurity: No Food Insecurity (10/30/2023)   Hunger Vital Sign    Worried About Running Out of Food in the Last Year: Never true    Ran Out of Food in the Last Year: Never true  Transportation Needs: No Transportation Needs (10/30/2023)   PRAPARE - Administrator, Civil Service (Medical): No    Lack of Transportation (Non-Medical): No  Physical Activity: Inactive (10/30/2023)   Exercise Vital Sign    Days of Exercise per Week: 0 days    Minutes of Exercise per Session: 0 min  Stress: No Stress Concern Present (10/30/2023)   [employer] of Occupational Health - Occupational Stress Questionnaire    Feeling of Stress : Only a little  Social Connections: Moderately Isolated (10/30/2023)   Social Connection and  Isolation Panel    Frequency of Communication with Friends and Family: More than three times a week    Frequency of Social Gatherings with Friends and Family: More than three times a week    Attends Religious Services: Never    Database administrator or Organizations: Yes    Attends Engineer, structural: More than 4 times per year    Marital Status: Widowed   Lives in a ***. Smoking: *** Occupation: ***  Environmental HistorySurveyor, minerals in the house: Network engineer in the family room: {Blank single:19197::yes,no} Carpet in the bedroom: {Blank single:19197::yes,no} Heating: {Blank single:19197::electric,gas,heat pump} Cooling: {Blank single:19197::central,window,heat pump} Pet: {Blank single:19197::yes ***,no}  Family History: Family History  Problem Relation Age of Onset   Liver cancer Mother    Breast cancer Neg Hx    Problem  Relation Asthma                                   *** Eczema                                *** Food allergy                          *** Allergic rhino conjunctivitis     ***  Review of Systems  Constitutional:  Negative for appetite change, chills, fever and unexpected weight change.  HENT:  Negative for congestion and rhinorrhea.   Eyes:  Negative for itching.  Respiratory:  Negative for cough, chest tightness, shortness of breath and wheezing.   Cardiovascular:  Negative for chest pain.  Gastrointestinal:  Negative for abdominal pain.  Genitourinary:  Negative for difficulty urinating.  Skin:  Negative for rash.  Neurological:  Negative for headaches.    Objective: There were no vitals taken for this visit. There is no height or weight on file to calculate BMI. Physical Exam Vitals and nursing note reviewed.  Constitutional:      Appearance: Normal appearance. She is well-developed.  HENT:     Head: Normocephalic and atraumatic.     Right  Ear: Tympanic membrane and external ear normal.     Left Ear: Tympanic membrane and external ear normal.     Nose: Nose normal.     Mouth/Throat:     Mouth: Mucous membranes are moist.     Pharynx: Oropharynx is clear.  Eyes:     Conjunctiva/sclera: Conjunctivae normal.  Cardiovascular:     Rate and Rhythm: Normal rate and regular rhythm.     Heart sounds: Normal heart sounds. No murmur heard.    No friction rub. No gallop.  Pulmonary:     Effort: Pulmonary effort is normal.     Breath sounds: Normal breath sounds. No wheezing, rhonchi or rales.  Musculoskeletal:     Cervical back: Neck supple.  Skin:    General: Skin is warm.     Findings: No rash.  Neurological:     Mental Status: She is alert and oriented to person, place, and time.  Psychiatric:        Behavior: Behavior normal.    The plan was reviewed with the patient/family, and all questions/concerned were addressed.  It was my pleasure to see Amit today and participate in her care. Please feel free to contact me with any questions or concerns.  Sincerely,  Orlan Cramp, DO Allergy & Immunology  [facility]  [place] office: [phone] [facility] office: [phone]

## 2024-06-04 ENCOUNTER — Other Ambulatory Visit

## 2024-06-04 DIAGNOSIS — E119 Type 2 diabetes mellitus without complications: Secondary | ICD-10-CM

## 2024-06-05 LAB — HEMOGLOBIN A1C
Hgb A1c MFr Bld: 6.9 % — ABNORMAL HIGH (ref <5.7)
Mean Plasma Glucose: 151 mg/dL
eAG (mmol/L): 8.4 mmol/L

## 2024-06-07 ENCOUNTER — Encounter: Payer: Self-pay | Admitting: Internal Medicine

## 2024-06-07 ENCOUNTER — Ambulatory Visit: Admitting: Internal Medicine

## 2024-06-07 VITALS — BP 110/60 | HR 82 | Ht 64.0 in | Wt 159.0 lb

## 2024-06-07 DIAGNOSIS — I10 Essential (primary) hypertension: Secondary | ICD-10-CM

## 2024-06-07 DIAGNOSIS — E039 Hypothyroidism, unspecified: Secondary | ICD-10-CM | POA: Diagnosis not present

## 2024-06-07 DIAGNOSIS — E785 Hyperlipidemia, unspecified: Secondary | ICD-10-CM | POA: Diagnosis not present

## 2024-06-07 DIAGNOSIS — E1169 Type 2 diabetes mellitus with other specified complication: Secondary | ICD-10-CM | POA: Diagnosis not present

## 2024-06-07 DIAGNOSIS — Z7984 Long term (current) use of oral hypoglycemic drugs: Secondary | ICD-10-CM

## 2024-06-07 MED ORDER — SITAGLIPTIN PHOSPHATE 100 MG PO TABS: 100.0000 mg | ORAL_TABLET | Freq: Every day | ORAL | 0 refills | Status: DC

## 2024-06-07 NOTE — Progress Notes (Signed)
 Patient Care Team: Perri Ronal PARAS, MD as PCP - General (Internal Medicine) [place], [facility]  Visit Date: 06/07/24  Subjective:    Patient ID: Courtney Ryan , Female   DOB: 1946-04-28, 78 y.o.    MRN: [id number]   78 y.o. Female presents today for 6 follow up for Diabetes Mellitus  type II. Patient has a past medical history of Hypertension, Neuropathy, Migraine headaches, chronic sinusitis deviated septum, hypothyroidism,  and goiter      History of Diabetes Mellitus type II treated with 15 mg Actos  daily.  06/04/2024 HgBa1c 6.9. Says she's eats a lot of bread and has sugar in her tea and coffee. Discontinuing Actos  and prescribing Jenuvia 100 mg.    History of Hypertension treated with Amlodipine  5 mg  daily and 25 mg Atenolol  daily. Blood pressure normotensive today at 110/60   History of Hyperlipidemia treated with Rosuvastatin  10 mg daily     History of Hypothyroidism treated with 75 mcg Levothyroxine  daily    Vaccine counseling: Covid-19 vaccine due    Past Medical History:  Diagnosis Date   Anxiety    takes xanax  as needed   Arthritis    bottom of spine and end of fingers   Bursitis of both knees    Bursitis of left hip    Cancer (HCC) 01/31/2022   Skin Cancer - Basal Cell Carcinoma   COPD (chronic obstructive pulmonary disease) (HCC)    test done 15 years ago had the beginnings of COPD   Depression    Fibrocystic breast disease    Goiter    Hyperlipidemia    Hypertension 2008   Dr. Perri   Osteopenia    Polyp in anterior nares    just in Rt nostral   Vitamin D  deficiency      Family History  Problem Relation Age of Onset   Liver cancer Mother    Breast cancer Neg Hx     Social History   Social History Narrative   Social history: She is a widow.  Husband died several months ago of esophageal cancer.  She smokes about 5 cigarettes daily and drinks a couple glasses of wine a week.  Has good friends and neighbors.       Family  history: Father died at age 89 of lung cancer.  Mother died at age 68 with cancer of the liver in 26      ROS      Objective:   Vitals: BP 110/60   Pulse 82   Ht 5' 4 (1.626 m)   Wt 159 lb (72.1 kg)   SpO2 98%   BMI 27.29 kg/m    Physical Exam Vitals and nursing note reviewed.       Results:     Labs:       Component Value Date/Time   NA 142 05/02/2024 1502   K 4.6 05/02/2024 1502   CL 104 05/02/2024 1502   CO2 19 (L) 05/02/2024 1502   GLUCOSE 90 05/02/2024 1502   GLUCOSE 95 10/30/2023 0943   BUN 21 05/02/2024 1502   CREATININE 0.98 05/02/2024 1502   CREATININE 1.00 10/30/2023 0943   CALCIUM  9.6 05/02/2024 1502   PROT 7.0 05/02/2024 1502   ALBUMIN 4.1 05/02/2024 1502   AST 13 05/02/2024 1502   ALT 11 05/02/2024 1502   ALKPHOS 98 05/02/2024 1502   BILITOT <0.2 05/02/2024 1502   GFRNONAA 50 (L) 03/22/2021 0917   GFRAA 58 (L) 03/22/2021 9082  Lab Results  Component Value Date   WBC 10.0 05/02/2024   HGB 13.3 05/02/2024   HCT 41.3 05/02/2024   MCV 88 05/02/2024   PLT 295 05/02/2024    Lab Results  Component Value Date   CHOL 143 04/12/2024   HDL 49 (L) 04/12/2024   LDLCALC 71 04/12/2024   TRIG 156 (H) 04/12/2024   CHOLHDL 2.9 04/12/2024    Lab Results  Component Value Date   HGBA1C 6.9 (H) 06/04/2024     Lab Results  Component Value Date   TSH 0.869 05/02/2024         Assessment & Plan:   Diabetes mellitus, type II: treated with 15 mg Actos  daily.  06/04/2024 HgBa1c 6.9. Says she's eats a lot of bread and has sugar in her tea and coffee.    Discontinuing Actos  and prescribing Jenuvia 100 mg.    Hypertension: treated with Amlodipine  5 mg  daily and 25 mg Atenolol  daily. Blood pressure normotensive today at 110/60   Hyperlipidemia: treated with Rosuvastatin  10 mg daily     Hypothyroidism: treated with 75 mcg Levothyroxine  daily    Vaccine counseling: Covid-19 vaccine due   I,Makayla C Reid,acting as a scribe for Ronal JINNY Hailstone, MD.,have documented all relevant documentation on the behalf of Ronal JINNY Hailstone, MD,as directed by  Ronal JINNY Hailstone, MD while in the presence of Ronal JINNY Hailstone, MD.

## 2024-06-10 ENCOUNTER — Other Ambulatory Visit: Payer: Self-pay

## 2024-06-10 ENCOUNTER — Ambulatory Visit: Admitting: Allergy

## 2024-06-10 ENCOUNTER — Encounter: Payer: Self-pay | Admitting: Allergy

## 2024-06-10 VITALS — BP 114/70 | HR 59 | Temp 98.7°F | Resp 14 | Ht 63.0 in | Wt 160.1 lb

## 2024-06-10 DIAGNOSIS — B999 Unspecified infectious disease: Secondary | ICD-10-CM

## 2024-06-10 DIAGNOSIS — R053 Chronic cough: Secondary | ICD-10-CM | POA: Diagnosis not present

## 2024-06-10 DIAGNOSIS — Z720 Tobacco use: Secondary | ICD-10-CM | POA: Diagnosis not present

## 2024-06-10 DIAGNOSIS — J310 Chronic rhinitis: Secondary | ICD-10-CM

## 2024-06-10 DIAGNOSIS — Z889 Allergy status to unspecified drugs, medicaments and biological substances status: Secondary | ICD-10-CM

## 2024-06-10 NOTE — Progress Notes (Signed)
 New Patient Note  RE: Courtney Ryan MRN: [id number] DOB: 04/08/1946 Date of Office Visit: 06/10/2024  Consult requested by: Perri Ronal PARAS, MD Primary care provider: Perri Ronal PARAS, MD  Chief Complaint: Establish Care (She states she has sinus infection and she was testing in 2009 that she had allergy to mold and dust mite- coughing, wheezing, SOB and itchy throat. )  History of Present Illness: I had the pleasure of seeing Courtney Ryan for initial evaluation at the [facility] on 06/10/2024. She is a 78 y.o. female, who is referred here by Perri Ronal PARAS, MD for the evaluation of cough.  Discussed the use of AI scribe software for clinical note transcription with the patient, who gave verbal consent to proceed.    She experiences frequent sinus infections, approximately two to three times per year, with symptoms of persistent sinus drainage primarily at the back of her throat, sneezing, and itchy eyes. These symptoms have progressively worsened over the years and occur year-round, without seasonal variation. She sometimes experiences headaches associated with these symptoms.  She takes Claritin daily, which provides partial relief. During sinus infections, she is prescribed antibiotics by her primary care doctor. Allergy testing done in 2008 or 2009 showed positive results for dust mites and mold, but she has not been on allergy shots. She has not undergone any sinus surgeries, but imaging ordered by an ENT specialist last year showed an infection - not available for review during OV. She was prescribed antibiotics, which she had to discontinue due to allergic reactions, including rashes and nausea. She has a nasal polyp?  No history of asthma or inhaler use, but she reports occasional coughing, wheezing, and shortness of breath, particularly in the afternoon when the Claritin wears off. She smokes two to three cigarettes a day. No known food allergies, issues with bee or  insect stings, or frequent infections other than sinus infections.  She has not used nasal sprays recently due to past experiences of epistaxis.     She reports symptoms of nasal congestion, PND, sneezing, some itchy eyes. Symptoms have been going on for many years. The symptoms are present all year around. Other triggers include exposure to unknown. Anosmia: no. Headache: sometimes. She has used Claritin with some improvement in symptoms. Sinus infections: 2-3 per year. Previous work up includes: skin testing in 2009 was positive to dust mites and mold. Previous ENT evaluation: yes, no sinus surgeries. . Previous sinus imaging: yes. History of nasal polyps: on right side? Last eye exam: last year. History of reflux: denies.   04/15/2024 PCP visit: She additionally c/o of persistence with her sinus issues and cough, for which she was seen in April for. Discussed seeing an Allergist for allergy testing, which she did previously have done through [facility]  in 2016. Skin testing positive for dust mites and mild.  2023 CXR IMPRESSION: There are no signs of pulmonary edema or new focal pulmonary consolidation. Linear density at the left cardiophrenic angle may suggest scarring or subsegmental atelectasis with no significant interval change since 06/10/2020.  2017 Ct sinus IMPRESSION:  1. No improvement in bilateral sphenoid sinusitis since January.  2. Regressed right maxillary sinus mucosal thickening. Right OMC now  appears patent.  3. Stable right maxillary sinus probable mucous retention cyst.   Assessment and Plan: Courtney Ryan is a 78 y.o. female with: Chronic rhinitis Tobacco user Perennial symptoms. Claritin somewhat effective. Past skin testing positive to dust mites  and mold per patient report. Return for allergy skin testing. Will make additional recommendations based on results. Stop smoking as smoking can irritate the nasal passages  contributing to your symptoms.   Recurrent infections Frequent sinus infections in the past.  Keep track of infections and antibiotics use. If persistent will get bloodwork next to look at immune system.   Chronic cough Cough with wheezing and shortness of breath, possibly exacerbated by smoking. No history of asthma or prior pulmonary function testing. 2023 CXR unremarkable. Patient had difficulty doing spirometry - with no improvement in FEV1 post bronchodilator treatment. Clinically feeling unchanged.  Monitor symptoms. Consider getting CXR next as well.   Multiple drug allergies Continue to avoid medications on your allergy list.   Return for Skin testing.  No orders of the defined types were placed in this encounter.  Lab Orders  No laboratory test(s) ordered today    Other allergy screening: Asthma: no Coughing, wheezing, shortness of breath - worse in the afternoons.   Food allergy: no Medication allergy: yes Hymenoptera allergy: no Urticaria: no Eczema:no History of recurrent infections suggestive of immunodeficency: frequent sinus infections  Diagnostics: Spirometry:  Tracings reviewed. Her effort: It was hard to get consistent efforts and there is a question as to whether this reflects a maximal maneuver. FVC: 1.52L FEV1: 0.59L, 31% predicted FEV1/FVC ratio: 39% Interpretation: patient had poor technique with no improvement in FEV1 post bronchodilator treatment. Clinically feeling unchanged.   Please see scanned spirometry results for details.  Results discussed with patient/family.   Past Medical History: Patient Active Problem List   Diagnosis Date Noted   Peripheral neuropathy 05/02/2024   Neuropathic pain 05/02/2024   Trigger finger of right thumb 12/21/2016   Intramural and subserous leiomyoma of uterus 10/01/2016   Deviated nasal septum 12/09/2015   Sinusitis, chronic 11/29/2015   Controlled diabetes mellitus type II without complication (HCC)  05/26/2014   Dependent edema 04/24/2014   Hyperlipidemia 06/16/2011   Hypothyroidism 06/16/2011   Osteoarthritis 06/16/2011   Goiter 06/16/2011   Fibrocystic breast disease 06/16/2011   Osteopenia 06/16/2011   Vitamin D  deficiency 06/16/2011   Hypertension 06/16/2011   Migraine headache 06/16/2011   Past Medical History:  Diagnosis Date   Anxiety    takes xanax  as needed   Arthritis    bottom of spine and end of fingers   Bursitis of both knees    Bursitis of left hip    Cancer (HCC) 01/31/2022   Skin Cancer - Basal Cell Carcinoma   COPD (chronic obstructive pulmonary disease) (HCC)    test done 15 years ago had the beginnings of COPD   Depression    Fibrocystic breast disease    Goiter    Hyperlipidemia    Hypertension 2008   Dr. Perri   Osteopenia    Polyp in anterior nares    just in Rt nostral   Recurrent upper respiratory infection (URI)    Vitamin D  deficiency    Past Surgical History: Past Surgical History:  Procedure Laterality Date   MOHS SURGERY  03/03/2022   nose   OOPHORECTOMY     OVARIAN CYST REMOVAL     PARATHYROIDECTOMY N/A 03/07/2019   Procedure: RIGHT INFERIOR PARATHYROIDECTOMY;  Surgeon: Eletha Boas, MD;  Location: [facility];  Service: General;  Laterality: N/A;   Medication List:  Current Outpatient Medications  Medication Sig Dispense Refill   ALPRAZolam  (XANAX ) 0.25 MG tablet TAKE 1 TABLET(0.25 MG) BY MOUTH TWICE DAILY AS NEEDED FOR ANXIETY 60  tablet 3   amLODipine  (NORVASC ) 5 MG tablet TAKE 1 TABLET(5 MG) BY MOUTH DAILY 90 tablet 3   aspirin 325 MG tablet Take 650 mg by mouth as needed (pain).     atenolol  (TENORMIN ) 25 MG tablet TAKE 1 TABLET BY MOUTH EVERY DAY 90 tablet 3   atenolol  (TENORMIN ) 25 MG tablet Take 1 tablet (25 mg total) by mouth daily. 90 tablet 3   conjugated estrogens  (PREMARIN ) vaginal cream INSERT 1 APPLICATORFUL VAGINALLY AT BEDTIME AS NEEDED 30 g 11   furosemide  (LASIX ) 40 MG tablet TAKE 1 TABLET(40 MG) BY MOUTH DAILY  90 tablet 3   furosemide  (LASIX ) 40 MG tablet Take 1 tablet (40 mg total) by mouth daily. 90 tablet 3   gabapentin  (NEURONTIN ) 100 MG capsule Take 1 capsule (100 mg total) by mouth 3 (three) times daily as needed. 90 capsule 11   loratadine (CLARITIN) 10 MG tablet Take 10 mg by mouth daily as needed for allergies.     pioglitazone  (ACTOS ) 30 MG tablet Take 1 tablet (30 mg total) by mouth daily. 90 tablet 0   rosuvastatin  (CRESTOR ) 10 MG tablet TAKE 1 TABLET(10 MG) BY MOUTH DAILY 90 tablet 3   SYNTHROID  75 MCG tablet TAKE 1 TABLET BY MOUTH EVERY DAY 90 tablet 0   sitaGLIPtin  (JANUVIA ) 100 MG tablet Take 1 tablet (100 mg total) by mouth daily. (Patient not taking: Reported on 06/10/2024) 90 tablet 0   No current facility-administered medications for this visit.   Allergies: Allergies  Allergen Reactions   Penicillins Nausea And Vomiting    Other Reaction(s): Unknown   Sulfa Antibiotics Hives and Rash    Other Reaction(s): Unknown   Levofloxacin     Other Other (See Comments)   Ceftriaxone  Other (See Comments) and Dermatitis   Clindamycin /Lincomycin Rash   Dust Mite Extract Other (See Comments)   Levofloxacin  In D5w Rash and Dermatitis   Molds & Smuts Other (See Comments)   Ofloxacin  Other (See Comments) and Dermatitis   Social History: Social History   Socioeconomic History   Marital status: Widowed    Spouse name: Not on file   Number of children: Not on file   Years of education: Not on file   Highest education level: Bachelor's degree (e.g., BA, AB, BS)  Occupational History   Not on file  Tobacco Use   Smoking status: Former    Current packs/day: 0.00    Types: Cigarettes    Quit date: 10/03/2018    Years since quitting: 5.6   Smokeless tobacco: Never  Vaping Use   Vaping status: Never Used  Substance and Sexual Activity   Alcohol use: Yes    Comment: occationally   Drug use: Never   Sexual activity: Not on file  Other Topics Concern   Not on file  Social History  Narrative   Social history: She is a widow.  Husband died several months ago of esophageal cancer.  She smokes about 5 cigarettes daily and drinks a couple glasses of wine a week.  Has good friends and neighbors.       Family history: Father died at age 28 of lung cancer.  Mother died at age 52 with cancer of the liver in 15   Social Drivers of Health   Financial Resource Strain: Low Risk  (10/30/2023)   Overall Financial Resource Strain (CARDIA)    Difficulty of Paying Living Expenses: Not very hard  Food Insecurity: No Food Insecurity (10/30/2023)   Hunger Vital Sign  Worried About Programme researcher, broadcasting/film/video in the Last Year: Never true    Ran Out of Food in the Last Year: Never true  Transportation Needs: No Transportation Needs (10/30/2023)   PRAPARE - Administrator, Civil Service (Medical): No    Lack of Transportation (Non-Medical): No  Physical Activity: Inactive (10/30/2023)   Exercise Vital Sign    Days of Exercise per Week: 0 days    Minutes of Exercise per Session: 0 min  Stress: No Stress Concern Present (10/30/2023)   [employer] of Occupational Health - Occupational Stress Questionnaire    Feeling of Stress : Only a little  Social Connections: Moderately Isolated (10/30/2023)   Social Connection and Isolation Panel    Frequency of Communication with Friends and Family: More than three times a week    Frequency of Social Gatherings with Friends and Family: More than three times a week    Attends Religious Services: Never    Database administrator or Organizations: Yes    Attends Engineer, structural: More than 4 times per year    Marital Status: Widowed   Lives in an apartment. Smoking: 2-3 cigs/day Occupation: retired  Landscape architect History: Immunologist in the house: no Engineer, civil (consulting) in the family room: no Carpet in the bedroom: yes Heating: oil Cooling: central Pet: yes 1 dog x 1 yr  Family History: Family History  Problem Relation Age  of Onset   Liver cancer Mother    Breast cancer Neg Hx    Problem                               Relation Asthma                                   no Eczema                                no Food allergy                          no Allergic rhino conjunctivitis     no  Review of Systems  Constitutional:  Negative for appetite change, chills, fever and unexpected weight change.  HENT:  Positive for congestion, postnasal drip and rhinorrhea.   Eyes:  Negative for itching.  Respiratory:  Positive for cough, chest tightness, shortness of breath and wheezing.   Cardiovascular:  Negative for chest pain.  Gastrointestinal:  Negative for abdominal pain.  Genitourinary:  Negative for difficulty urinating.  Skin:  Negative for rash.  Neurological:  Negative for headaches.    Objective: BP 114/70 (BP Location: Left Arm, Patient Position: Sitting, Cuff Size: Normal)   Pulse (!) 59   Temp 98.7 F (37.1 C) (Temporal)   Resp 14   Ht 5' 3 (1.6 m)   Wt 160 lb 1.6 oz (72.6 kg)   SpO2 95%   BMI 28.36 kg/m  Body mass index is 28.36 kg/m. Physical Exam Vitals and nursing note reviewed.  Constitutional:      Appearance: Normal appearance. She is well-developed.  HENT:     Head: Normocephalic and atraumatic.     Right Ear: Tympanic membrane and external ear normal.     Left Ear: Tympanic membrane and external ear normal.  Nose: Nose normal.     Mouth/Throat:     Mouth: Mucous membranes are moist.     Pharynx: Oropharynx is clear.  Eyes:     Conjunctiva/sclera: Conjunctivae normal.  Cardiovascular:     Rate and Rhythm: Normal rate and regular rhythm.     Heart sounds: Normal heart sounds. No murmur heard.    No friction rub. No gallop.  Pulmonary:     Effort: Pulmonary effort is normal.     Breath sounds: Normal breath sounds. No wheezing, rhonchi or rales.  Musculoskeletal:     Cervical back: Neck supple.  Skin:    General: Skin is warm.     Findings: No rash.  Neurological:      Mental Status: She is alert and oriented to person, place, and time.  Psychiatric:        Behavior: Behavior normal.    The plan was reviewed with the patient/family, and all questions/concerned were addressed.  It was my pleasure to see Rosaleen today and participate in her care. Please feel free to contact me with any questions or concerns.  Sincerely,  Orlan Cramp, DO Allergy & Immunology  [facility]  [place] office: [phone] [facility] office: [phone]

## 2024-06-10 NOTE — Patient Instructions (Addendum)
 Rhinitis  Return for allergy skin testing. Will make additional recommendations based on results. Make sure you don't take any antihistamines for 3 days before the skin testing appointment. Don't put any lotion on the back and arms on the day of testing.  Must be in good health and not ill. No vaccines/injections/antibiotics within the past 7 days.  Plan on being here for 30-60 minutes.  Stop smoking as smoking can irritate the nasal passages contributing to your symptoms.   Breathing No improvement post inhaler. Monitor symptoms.  Multiple drug allergies Continue to avoid medications on your allergy list.   Infections Keep track of infections and antibiotics use. If persistent will get bloodwork next to look at immune system.   Follow up for skin testing.   Make sure you stop your Claritin 3 days before skin testing.

## 2024-06-16 NOTE — Patient Instructions (Signed)
 Patient will monitor accucchecks regularly. Switch to Januvia  100 mg daily and follow up in 6 weeks.

## 2024-06-17 ENCOUNTER — Other Ambulatory Visit: Payer: Self-pay | Admitting: Internal Medicine

## 2024-06-20 LAB — LAB REPORT - SCANNED
A1c: 6.5
EGFR: 57

## 2024-06-24 ENCOUNTER — Encounter: Payer: Self-pay | Admitting: Allergy

## 2024-06-24 ENCOUNTER — Ambulatory Visit: Admitting: Allergy

## 2024-06-24 DIAGNOSIS — Z889 Allergy status to unspecified drugs, medicaments and biological substances status: Secondary | ICD-10-CM

## 2024-06-24 DIAGNOSIS — Z130 Encounter for screening for diseases of the blood and blood-forming organs and certain disorders involving the immune mechanism: Secondary | ICD-10-CM

## 2024-06-24 DIAGNOSIS — B999 Unspecified infectious disease: Secondary | ICD-10-CM

## 2024-06-24 DIAGNOSIS — Z720 Tobacco use: Secondary | ICD-10-CM

## 2024-06-24 DIAGNOSIS — R053 Chronic cough: Secondary | ICD-10-CM

## 2024-06-24 DIAGNOSIS — J310 Chronic rhinitis: Secondary | ICD-10-CM

## 2024-06-24 NOTE — Progress Notes (Signed)
 Skin testing note  RE: Livana Yerian MRN: [id number] DOB: 1946/03/14 Date of Office Visit: 06/24/2024  Referring provider: Perri Ronal PARAS, MD Primary care provider: Perri Ronal PARAS, MD  Chief Complaint: skin testing  History of Present Illness: I had the pleasure of seeing Leartis Register for a skin testing visit at the [facility] on 06/24/2024. She is a 78 y.o. female, who is being followed for chronic rhinitis, tobacco user, recurrent infections, chronic cough, multiple drug allergies. Her previous allergy  office visit was on 06/10/2024 with Dr. Luke. Today is a skin testing visit.   Discussed the use of AI scribe software for clinical note transcription with the patient, who gave verbal consent to proceed.    She has been experiencing recurrent sinus infections and has undergone allergy  testing, which returned negative results. She has not had blood work done for allergies previously.  She discontinued Claritin for three days and noted a worsening of her symptoms during this period.  A couple of years ago, she visited an ear, nose, and throat doctor where a polyp? was found but not removed. During that time, she developed an infection and was prescribed antibiotics to which she was allergic.  She has tried a nasal spray in the past, which caused epistaxis.  She is a smoker, which may be contributing to her symptoms.     Assessment and Plan: Ellise is a 78 y.o. female with: Chronic rhinitis Tobacco user Past history - perennial symptoms. Claritin somewhat effective. Past skin testing positive to dust mites and mold per patient report. Today's skin prick testing negative to indoor/outdoor allergens. Get bloodwork instead of intradermal testing as she needs bloodwork.  If negative will refer to ENT next.  Take Claritin (loratadine) 10mg  daily.  May use nasal saline spray as needed.  Stop smoking as smoking can irritate the nasal passages contributing to your symptoms.    Recurrent infections Past history - frequent sinus infections in the past.  Keep track of infections and antibiotics use. Get bloodwork next to look at immune system.    Chronic cough Past history - cough with wheezing and shortness of breath, possibly exacerbated by smoking. No history of asthma or prior pulmonary function testing. 2023 CXR unremarkable. 2025 difficulty doing spirometry - with no improvement in FEV1 post bronchodilator treatment. Clinically feeling unchanged.  Monitor symptoms. Consider getting CXR if persistent.    Multiple drug allergies Continue to avoid medications on your allergy  list.   Return in about 4 months (around 10/24/2024).  No orders of the defined types were placed in this encounter.  Lab Orders         Allergens w/Total IgE Area 2         CBC with Differential/Platelet         IgG, IgA, IgM         Strep pneumoniae 23 Serotypes IgG         Diphtheria / Tetanus Antibody Panel      Diagnostics: Skin Testing: Environmental allergy  panel. Today's skin prick testing negative to indoor/outdoor allergens. Results discussed with patient/family.  Airborne Adult Perc - 06/24/24 1339     Time Antigen Placed 1339    Allergen Manufacturer Jestine    Location Back    Number of Test 55    1. Control-Buffer 50% Glycerol Negative    2. Control-Histamine 2+    3. Bahia Negative    4. French Southern Territories Negative    5. Johnson Negative  6. Kentucky  Blue Negative    7. Meadow Fescue Negative    8. Perennial Rye Negative    9. Timothy Negative    10. Ragweed Mix Negative    11. Cocklebur Negative    12. Plantain,  English Negative    13. Baccharis Negative    14. Dog Fennel Negative    15. Russian Thistle Negative    16. Lamb's Quarters Negative    17. Sheep Sorrell Negative    18. Rough Pigweed Negative    19. Marsh Elder, Rough Negative    20. Mugwort, Common Negative    21. Box, Elder Negative    22. Cedar, red Negative    23. Sweet Gum Negative     24. Pecan Pollen Negative    25. Pine Mix Negative    26. Walnut, Black Pollen Negative    27. Red Mulberry Negative    28. Ash Mix Negative    29. Birch Mix Negative    30. Beech American Negative    31. Cottonwood, Guinea-Bissau Negative    32. Hickory, White Negative    33. Maple Mix Negative    34. Oak, Guinea-Bissau Mix Negative    35. Sycamore Eastern Negative    36. Alternaria Alternata Negative    37. Cladosporium Herbarum Negative    38. Aspergillus Mix Negative    39. Penicillium Mix Negative    40. Bipolaris Sorokiniana (Helminthosporium) Negative    41. Drechslera Spicifera (Curvularia) Negative    42. Mucor Plumbeus Negative    43. Fusarium Moniliforme Negative    44. Aureobasidium Pullulans (pullulara) Negative    45. Rhizopus Oryzae Negative    46. Botrytis Cinera Negative    47. Epicoccum Nigrum Negative    48. Phoma Betae Negative    49. Dust Mite Mix Negative    50. Cat Hair 10,000 BAU/ml Negative    51.  Dog Epithelia Negative    52. Mixed Feathers Negative    53. Horse Epithelia Negative    54. Cockroach, German Negative    55. Tobacco Leaf Negative          Previous notes and tests were reviewed. The plan was reviewed with the patient/family, and all questions/concerned were addressed.  It was my pleasure to see Ndeye today and participate in her care. Please feel free to contact me with any questions or concerns.  Sincerely,  Orlan Cramp, DO Allergy  & Immunology  [facility]  [place] office: [phone] [facility] office: [phone]

## 2024-06-24 NOTE — Patient Instructions (Addendum)
 Today's skin testing negative to indoor/outdoor allergens.  Results given.  Rhinitis  Take Claritin (loratadine) 10mg  daily.  May use nasal saline spray as needed.  Stop smoking as smoking can irritate the nasal passages contributing to your symptoms.   Breathing Monitor symptoms.  Multiple drug allergies Continue to avoid medications on your allergy  list.   Infections Keep track of infections and antibiotics use. Get bloodwork next to look at immune system.   Get bloodwork We are ordering labs, so please allow 1-2 weeks for the results to come back. With the newly implemented Cures Act, the labs might be visible to you at the same time that they become visible to me. However, I will not address the results until all of the results are back, so please be patient.  In the meantime, continue recommendations in your patient instructions, including avoidance measures (if applicable), until you hear from me.  Follow up in 4 months or sooner if needed.

## 2024-06-27 ENCOUNTER — Telehealth: Payer: Self-pay | Admitting: Allergy

## 2024-06-27 ENCOUNTER — Ambulatory Visit: Payer: Self-pay | Admitting: Allergy

## 2024-06-27 LAB — STREP PNEUMONIAE 23 SEROTYPES IGG
Pneumo Ab Type 1*: 0.2 ug/mL — AB (ref >1.3)
Pneumo Ab Type 12 (12F)*: <0.1 ug/mL — AB (ref >1.3)
Pneumo Ab Type 14*: 1.2 ug/mL — AB (ref >1.3)
Pneumo Ab Type 17 (17F)*: <0.1 ug/mL — AB (ref >1.3)
Pneumo Ab Type 19 (19F)*: 0.5 ug/mL — AB (ref >1.3)
Pneumo Ab Type 2*: 1.3 ug/mL — AB (ref >1.3)
Pneumo Ab Type 20*: <0.2 ug/mL — AB (ref >1.3)
Pneumo Ab Type 22 (22F)*: 15.2 ug/mL (ref >1.3)
Pneumo Ab Type 23 (23F)*: <0.1 ug/mL — AB (ref >1.3)
Pneumo Ab Type 26 (6B)*: <0.1 ug/mL — AB (ref >1.3)
Pneumo Ab Type 3*: 0.7 ug/mL — AB (ref >1.3)
Pneumo Ab Type 34 (10A)*: <0.1 ug/mL — AB (ref >1.3)
Pneumo Ab Type 4*: 0.4 ug/mL — AB (ref >1.3)
Pneumo Ab Type 43 (11A)*: <0.1 ug/mL — AB (ref >1.3)
Pneumo Ab Type 5*: <0.1 ug/mL — AB (ref >1.3)
Pneumo Ab Type 51 (7F)*: 1.1 ug/mL — AB (ref >1.3)
Pneumo Ab Type 54 (15B)*: <0.2 ug/mL — AB (ref >1.3)
Pneumo Ab Type 56 (18C)*: 2.9 ug/mL (ref >1.3)
Pneumo Ab Type 57 (19A)*: 1.6 ug/mL (ref >1.3)
Pneumo Ab Type 68 (9V)*: 4 ug/mL (ref >1.3)
Pneumo Ab Type 70 (33F)*: 0.3 ug/mL — AB (ref >1.3)
Pneumo Ab Type 8*: <0.3 ug/mL — AB (ref >1.3)
Pneumo Ab Type 9 (9N)*: 7.8 ug/mL (ref >1.3)

## 2024-06-27 LAB — CBC WITH DIFFERENTIAL/PLATELET
Basophils Absolute: 0 x10E3/uL (ref 0.0–0.2)
Basos: 0 %
EOS (ABSOLUTE): 0 x10E3/uL (ref 0.0–0.4)
Eos: 0 %
Hematocrit: 38.5 % (ref 34.0–46.6)
Hemoglobin: 12.9 g/dL (ref 11.1–15.9)
Immature Grans (Abs): 0 x10E3/uL (ref 0.0–0.1)
Immature Granulocytes: 0 %
Lymphocytes Absolute: 2 x10E3/uL (ref 0.7–3.1)
Lymphs: 20 %
MCH: 29.7 pg (ref 26.6–33.0)
MCHC: 33.5 g/dL (ref 31.5–35.7)
MCV: 89 fL (ref 79–97)
Monocytes Absolute: 0.7 x10E3/uL (ref 0.1–0.9)
Monocytes: 7 %
Neutrophils Absolute: 6.9 x10E3/uL (ref 1.4–7.0)
Neutrophils: 73 %
Platelets: 285 x10E3/uL (ref 150–450)
RBC: 4.34 x10E6/uL (ref 3.77–5.28)
RDW: 14.9 % (ref 11.7–15.4)
WBC: 9.6 x10E3/uL (ref 3.4–10.8)

## 2024-06-27 LAB — ALLERGENS W/TOTAL IGE AREA 2

## 2024-06-27 LAB — IGG, IGA, IGM
IgA/Immunoglobulin A, Serum: 286 mg/dL (ref 64–422)
IgG (Immunoglobin G), Serum: 1148 mg/dL (ref 586–1602)
IgM (Immunoglobulin M), Srm: 102 mg/dL (ref 26–217)

## 2024-06-27 LAB — DIPHTHERIA / TETANUS ANTIBODY PANEL
Diphtheria Ab: 0.66 [IU]/mL (ref <0.10)
Tetanus Ab, IgG: 0.86 [IU]/mL (ref <0.10)

## 2024-06-27 NOTE — Telephone Encounter (Signed)
 Please refer to ENT for chronic non-allergic rhinitis.  Thank you.

## 2024-06-27 NOTE — Telephone Encounter (Signed)
 Courtney Ryan has been internally referred to [facility].  They will call patient to schedule.  I will follow up in a week.

## 2024-06-28 ENCOUNTER — Encounter (INDEPENDENT_AMBULATORY_CARE_PROVIDER_SITE_OTHER): Payer: Self-pay

## 2024-07-12 ENCOUNTER — Ambulatory Visit (INDEPENDENT_AMBULATORY_CARE_PROVIDER_SITE_OTHER)

## 2024-07-12 DIAGNOSIS — Z23 Encounter for immunization: Secondary | ICD-10-CM | POA: Diagnosis not present

## 2024-07-16 ENCOUNTER — Other Ambulatory Visit

## 2024-07-16 DIAGNOSIS — E119 Type 2 diabetes mellitus without complications: Secondary | ICD-10-CM

## 2024-07-16 DIAGNOSIS — E1169 Type 2 diabetes mellitus with other specified complication: Secondary | ICD-10-CM

## 2024-07-17 ENCOUNTER — Ambulatory Visit: Payer: Self-pay | Admitting: Internal Medicine

## 2024-07-17 LAB — HEMOGLOBIN A1C
Hgb A1c MFr Bld: 6.6 % — ABNORMAL HIGH (ref <5.7)
Mean Plasma Glucose: 143 mg/dL
eAG (mmol/L): 7.9 mmol/L

## 2024-07-17 LAB — MICROALBUMIN / CREATININE URINE RATIO
Creatinine, Urine: 183 mg/dL (ref 20–275)
Microalb Creat Ratio: 5 mg/g{creat} (ref <30)
Microalb, Ur: 1 mg/dL

## 2024-07-26 ENCOUNTER — Telehealth: Payer: Self-pay | Admitting: Neurology

## 2024-07-26 NOTE — Telephone Encounter (Signed)
 Pt called stating that she would like to discuss with her provider stopping the use of gabapentin  (NEURONTIN ) 100 MG capsule She states she has been having joint pain and she read up on it and it shows to be one of the side effects of taking this medication. Please advise.

## 2024-07-29 ENCOUNTER — Other Ambulatory Visit: Payer: Self-pay | Admitting: Internal Medicine

## 2024-07-29 NOTE — Telephone Encounter (Signed)
 It is okay not to take gabapentin  because of side effect she was describing about  She is describing difficulty getting out of the bed, if she has joints pain, low back pain, joints pain may consider as needed NSAIDs mixed with Tylenol , such as Aleve , alternating with Tylenol ,  I can also refer her to physical therapy if she agrees

## 2024-07-29 NOTE — Telephone Encounter (Signed)
 Last seen 05/02/24 by Dr. Onita. Next appt scheduled for 11/25/24 with Harlene M,NP. Should be taking gabapentin  100mg  po TID prn.   Called pt at [phone]. She stopped gabapentin  07/25/24. Sx improved. Some joint pain left over but not as severe when taking gabapentin . While taking gabapentin , she experienced difficulty getting out of bed, bending down, difficulty walking. Added to list of allergy  list as intolerance.   Called [facility] at  [phone]. Cx any remaining refills on file for gabapentin . Spoke w/ Kerri.  Aware I will send to Dr. Onita to see if there are other medication options she can try. Aware we will call back once we hear from Dr. Onita.

## 2024-08-14 NOTE — Telephone Encounter (Signed)
 [facility] closed out the referral due to Patient Refusal

## 2024-08-16 ENCOUNTER — Ambulatory Visit: Payer: Self-pay | Admitting: *Deleted

## 2024-08-16 ENCOUNTER — Ambulatory Visit: Admitting: Internal Medicine

## 2024-08-16 ENCOUNTER — Encounter: Payer: Self-pay | Admitting: Internal Medicine

## 2024-08-16 VITALS — BP 110/70 | HR 66 | Ht 63.0 in

## 2024-08-16 DIAGNOSIS — E039 Hypothyroidism, unspecified: Secondary | ICD-10-CM

## 2024-08-16 DIAGNOSIS — M546 Pain in thoracic spine: Secondary | ICD-10-CM

## 2024-08-16 DIAGNOSIS — M549 Dorsalgia, unspecified: Secondary | ICD-10-CM

## 2024-08-16 DIAGNOSIS — E1169 Type 2 diabetes mellitus with other specified complication: Secondary | ICD-10-CM

## 2024-08-16 DIAGNOSIS — M7918 Myalgia, other site: Secondary | ICD-10-CM

## 2024-08-16 DIAGNOSIS — I10 Essential (primary) hypertension: Secondary | ICD-10-CM

## 2024-08-16 MED ORDER — MELOXICAM 15 MG PO TABS: ORAL_TABLET | ORAL | 0 refills | Status: DC

## 2024-08-16 MED ORDER — CYCLOBENZAPRINE HCL 10 MG PO TABS: 10.0000 mg | ORAL_TABLET | Freq: Every day | ORAL | 2 refills | Status: AC

## 2024-08-16 NOTE — Progress Notes (Addendum)
 Patient Care Team: Perri Ronal PARAS, MD as PCP - General (Internal Medicine) [place], [facility]  Visit Date: 08/16/24  Subjective:    Patient ID: Courtney Ryan , Female   DOB: 1946/08/27, 78 y.o.    MRN: [id number]   78 y.o. Female presents today for musculoskeletal pian. Patient has a past medical history of Hypertension, Hypothyroidism, Diabetes Mellitus type II, Hyperlipidemia.  She said that she woke up one morning with pan in her hips,back and shoulder. She has had pain for 3 weeks. Tracie has never had pain like this before. She denies doing any strenuous activity like lifting heavy items. Complains of  pain in left sternocleidomastoid muscle and tenderness in her lower lumbar area and shoulder. She said that she has been treating the pain with Aspirin, up to 8 a day. She said that the aspirin only helps to alleviate some of the pain. She has also been putting heat on the areas.    Past Medical History:  Diagnosis Date   Anxiety    takes xanax  as needed   Arthritis    bottom of spine and end of fingers   Bursitis of both knees    Bursitis of left hip    Cancer (HCC) 01/31/2022   Skin Cancer - Basal Cell Carcinoma   COPD (chronic obstructive pulmonary disease) (HCC)    test done 15 years ago had the beginnings of COPD   Depression    Fibrocystic breast disease    Goiter    Hyperlipidemia    Hypertension 2008   Dr. Perri   Osteopenia    Polyp in anterior nares    just in Rt nostral   Recurrent upper respiratory infection (URI)    Vitamin D  deficiency      Family History  Problem Relation Age of Onset   Liver cancer Mother    Breast cancer Neg Hx     Social History   Social History Narrative   Social history: She is a widow.  Husband died several months ago of esophageal cancer.  She smokes about 5 cigarettes daily and drinks a couple glasses of wine a week.  Has good friends and neighbors.       Family history: Father died at age 33 of lung  cancer.  Mother died at age 63 with cancer of the liver in 1963      Review of Systems  Musculoskeletal:  Positive for back pain.        Objective:   Vitals: BP 110/70   Pulse 66   Ht 5' 3 (1.6 m)   SpO2 96%   BMI 28.36 kg/m    Physical Exam Musculoskeletal:     Comments: heberden's and bouchard's nodes on hands.   Neurological:     Comments: Strength normal. No pain on straight leg raising.      Palpable tenderness upper back and shoulders. No increased warmth in those area.  Results:     Labs:       Component Value Date/Time   NA 142 05/02/2024 1502   K 4.6 05/02/2024 1502   CL 104 05/02/2024 1502   CO2 19 (L) 05/02/2024 1502   GLUCOSE 90 05/02/2024 1502   GLUCOSE 95 10/30/2023 0943   BUN 21 05/02/2024 1502   CREATININE 0.98 05/02/2024 1502   CREATININE 1.00 10/30/2023 0943   CALCIUM  9.6 05/02/2024 1502   PROT 7.0 05/02/2024 1502   ALBUMIN 4.1 05/02/2024 1502   AST 13 05/02/2024 1502  ALT 11 05/02/2024 1502   ALKPHOS 98 05/02/2024 1502   BILITOT <0.2 05/02/2024 1502   GFRNONAA 50 (L) 03/22/2021 0917   GFRAA 58 (L) 03/22/2021 0917     Lab Results  Component Value Date   WBC 9.6 06/24/2024   HGB 12.9 06/24/2024   HCT 38.5 06/24/2024   MCV 89 06/24/2024   PLT 285 06/24/2024    Lab Results  Component Value Date   CHOL 143 04/12/2024   HDL 49 (L) 04/12/2024   LDLCALC 71 04/12/2024   TRIG 156 (H) 04/12/2024   CHOLHDL 2.9 04/12/2024    Lab Results  Component Value Date   HGBA1C 6.6 (H) 07/16/2024     Lab Results  Component Value Date   TSH 0.869 05/02/2024        Assessment & Plan:   Orders Placed This Encounter  Procedures   Rheumatoid factor   ANA   C-reactive protein   Sedimentation rate   Uric acid   CK, TOTAL(REFL)   CEA   CA 125   Meds ordered this encounter  Medications   meloxicam  (MOBIC ) 15 MG tablet    Sig: One tablet by mouth daily with a meal    Dispense:  30 tablet    Refill:  0   cyclobenzaprine   (FLEXERIL ) 10 MG tablet    Sig: Take 1 tablet (10 mg total) by mouth at bedtime.    Dispense:  30 tablet    Refill:  2     Upper Back Pain: She said that she awakened one morning with pan in her hips,back and shoulder. She has had this pain for 3 weeks and she said she has never had pain like this before. She denies doing any strenuous activity like lifting heavy items. Physical exam revealed tenderness in her left sternocleidomastoid muscle and tenderness in her lower lumbar area and shoulder. She said that she has been treating the pain with Aspirin, up to 8 a day. She said that  aspirin only helps to alleviate some of the pain. She has also been putting heat on the areas.  Labs drawn today include Rheumatoid factor, ANA, CRP, Sed Rate, Uric acid, Total CK, CEA, CA 125 ordered.  Plan: Meloxicam  15 mg daily with a meal, Flexeril  10 mg at bedtime prescribed.  Referral for physical therapy.  Follow up in about 2 weeks.  Multiple Rheumatology labs ordered  I,Makayla C Reid,acting as a scribe for Ronal JINNY Hailstone, MD.,have documented all relevant documentation on the behalf of Ronal JINNY Hailstone, MD,as directed by  Ronal JINNY Hailstone, MD while in the presence of Ronal JINNY Hailstone, MD.   I, Ronal JINNY Hailstone, MD, have reviewed all documentation for this visit. The documentation on 08/16/2024 for the exam, diagnosis, procedures, and orders are all accurate and complete.

## 2024-08-16 NOTE — Telephone Encounter (Signed)
 FYI Only or Action Required?: FYI only for provider: appointment scheduled on 11/14.  Patient was last seen in primary care on 06/07/2024 by Perri Ronal PARAS, MD.  Called Nurse Triage reporting Pain.  Symptoms began several weeks ago.  Interventions attempted: Rest, hydration, or home remedies.  Symptoms are: gradually worsening.  Triage Disposition: See HCP Within 4 Hours (Or PCP Triage)  Patient/caregiver understands and will follow disposition?: Yes  Copied from CRM #[id number]. Topic: Clinical - Red Word Triage >> Aug 16, 2024  9:55 AM Sophia H wrote: Red Word that prompted transfer to Nurse Triage: A lot of pain going on 3 weeks now - states can hardly function. Muscle/joint No recent falls, lifting or twists that would cause this.  States the only thing that changed was she started taking gabapentin  from her neurologist but she has stopped taking it since the pain started with no change. Reason for Disposition  [1] SEVERE pain (e.g., excruciating, unable to do any normal activities) AND [2] not improved 2 hours after pain medicine  Answer Assessment - Initial Assessment Questions 1. ONSET: When did the muscle aches or body pains start?      3 weeks 2. LOCATION: What part of your body is hurting? (e.g., entire body, arms, legs)      Started at the hips and traveled up the spine and into shoulders 3. SEVERITY: How bad is the pain? (Scale 1-10; or mild, moderate, severe)     9/10 4. CAUSE: What do you think is causing the pains?     Unsure- patient does have neuropathy pain 5. FEVER: Do you have a fever? If Yes, ask: What is your temperature, how was it measured, and  when did it start?      no 6. OTHER SYMPTOMS: Do you have any other symptoms? (e.g., chest pain, cold or flu symptoms, rash, weakness, weight loss)     no  Protocols used: Muscle Aches and Body Pain-A-AH

## 2024-08-16 NOTE — Progress Notes (Signed)
 Patient Care Team: Perri Ronal PARAS, MD as PCP - General (Internal Medicine) [place], [facility]  Visit Date: 08/23/24  Subjective:    Patient ID: Courtney Ryan , Female   DOB: 1946/05/05, 78 y.o.    MRN: [id number]   78 y.o. Female presents today for 1 week follow up for musculoskeletal pain. Patient has a past medical history of Hypertension, Hypothyroidism, Diabetes Mellitus type II, Hyperlipidemia .  Was last seen in this office on 08/16/2024 for Back pain. She said that the pain is in her lower back and shoulders. She was prescribed Meloxicam  15 mg daily and Flexeril  10 mg at bedtime. Today she said that the pain has slightly improved but had not entirely gone away especially in her shoulders. She said she didn't feel that the Flexeril  was helpful.  Labs drawn on 08/16/2024  revealed CA125 of  27, Rheumatoid factor of  57, ANA negative, CRP 76.4, Sed Rate 130, Uric acid 6.9, Total CK 32, CEA 3.5.     Past Medical History:  Diagnosis Date  . Anxiety    takes xanax  as needed  . Arthritis    bottom of spine and end of fingers  . Bursitis of both knees   . Bursitis of left hip   . Cancer (HCC) 01/31/2022   Skin Cancer - Basal Cell Carcinoma  . COPD (chronic obstructive pulmonary disease) (HCC)    test done 15 years ago had the beginnings of COPD  . Depression   . Fibrocystic breast disease   . Goiter   . Hyperlipidemia   . Hypertension 2008   Dr. Perri  . Osteopenia   . Polyp in anterior nares    just in Rt nostral  . Recurrent upper respiratory infection (URI)   . Vitamin D  deficiency      Family History  Problem Relation Age of Onset  . Liver cancer Mother   . Breast cancer Neg Hx     Social History   Social History Narrative   Social history: She is a widow.  Husband died several months ago of esophageal cancer.  She smokes about 5 cigarettes daily and drinks a couple glasses of wine a week.  Has good friends and neighbors.       Family  history: Father died at age 21 of lung cancer.  Mother died at age 67 with cancer of the liver in 1963      Review of Systems  Musculoskeletal:  Positive for back pain.        Objective:   Vitals: BP 130/60   Pulse 64   Ht 5' 3 (1.6 m)   Wt 163 lb (73.9 kg)   SpO2 96%   BMI 28.87 kg/m    Physical Exam Vitals and nursing note reviewed.       Results:    Labs:       Component Value Date/Time   NA 142 05/02/2024 1502   K 4.6 05/02/2024 1502   CL 104 05/02/2024 1502   CO2 19 (L) 05/02/2024 1502   GLUCOSE 90 05/02/2024 1502   GLUCOSE 95 10/30/2023 0943   BUN 21 05/02/2024 1502   CREATININE 0.98 05/02/2024 1502   CREATININE 1.00 10/30/2023 0943   CALCIUM  9.6 05/02/2024 1502   PROT 7.0 05/02/2024 1502   ALBUMIN 4.1 05/02/2024 1502   AST 13 05/02/2024 1502   ALT 11 05/02/2024 1502   ALKPHOS 98 05/02/2024 1502   BILITOT <0.2 05/02/2024 1502   GFRNONAA 50 (  L) 03/22/2021 0917   GFRAA 58 (L) 03/22/2021 0917     Lab Results  Component Value Date   WBC 9.6 06/24/2024   HGB 12.9 06/24/2024   HCT 38.5 06/24/2024   MCV 89 06/24/2024   PLT 285 06/24/2024    Lab Results  Component Value Date   CHOL 143 04/12/2024   HDL 49 (L) 04/12/2024   LDLCALC 71 04/12/2024   TRIG 156 (H) 04/12/2024   CHOLHDL 2.9 04/12/2024    Lab Results  Component Value Date   HGBA1C 6.6 (H) 07/16/2024     Lab Results  Component Value Date   TSH 0.869 05/02/2024        Assessment & Plan:   Back Pain: Was last seen in this office on 08/16/2024 for Back pain. She said that the pain is in her lower back and shoulders. She was prescribed Meloxicam  15 mg daily and Flexeril  10 mg at bedtime. Today she said that the pain has gotten better but had not entirely gone away especially in her shoulders. She said she didn't feel that the flexeril  was helpful. 08/16/2024 CA125 27, Rheumatoid factor 57, ANA negative, CRP 76.4, Sed Rate 130, Uric acid 6.9, Total CK 32, CEA 3.5.  With high sed  rate consider PMR.      Referred to Rheumatologist- request urgent evaluation   Medrol  4 mg to take in tapering course as directed 6-6-5-5-4-4-3-3-2-2-1-1  Hydrocodone -acetaminophen  10-325 as needed prescribed. 5 day supply #15 tabs.  I,Makayla C Reid,acting as a scribe for Ronal JINNY Hailstone, MD.,have documented all relevant documentation on the behalf of Ronal JINNY Hailstone, MD,as directed by  Ronal JINNY Hailstone, MD while in the presence of Ronal JINNY Hailstone, MD.

## 2024-08-18 ENCOUNTER — Ambulatory Visit: Payer: Self-pay | Admitting: Internal Medicine

## 2024-08-19 ENCOUNTER — Other Ambulatory Visit: Payer: Self-pay

## 2024-08-19 DIAGNOSIS — R7689 Other specified abnormal immunological findings in serum: Secondary | ICD-10-CM

## 2024-08-19 LAB — RHEUMATOID FACTOR: Rheumatoid fact SerPl-aCnc: 57 [IU]/mL — ABNORMAL HIGH (ref <14)

## 2024-08-19 LAB — CK, TOTAL(REFL): Total CK: 32 U/L (ref 16–215)

## 2024-08-19 LAB — URIC ACID: Uric Acid, Serum: 6.9 mg/dL (ref 2.5–7.0)

## 2024-08-19 LAB — SEDIMENTATION RATE: Sed Rate: 130 mm/h — ABNORMAL HIGH (ref 0–30)

## 2024-08-19 LAB — ANA: Anti Nuclear Antibody (ANA): NEGATIVE

## 2024-08-19 LAB — CA 125: CA 125: 27 U/mL (ref <35)

## 2024-08-19 LAB — C-REACTIVE PROTEIN: CRP: 76.4 mg/L — ABNORMAL HIGH (ref <8.0)

## 2024-08-19 LAB — CEA: CEA: 3.5 ng/mL — ABNORMAL HIGH

## 2024-08-22 NOTE — Patient Instructions (Addendum)
 Multiple Rheumatology tests ordered. Will contact patient with results and discuss treatment or referral options at that time.  Addendum: She has appt with Dr. Jeannetta in December to be evaluated for symptoms and abnormal Rheumatology labs

## 2024-08-23 ENCOUNTER — Encounter: Payer: Self-pay | Admitting: Internal Medicine

## 2024-08-23 ENCOUNTER — Ambulatory Visit: Admitting: Internal Medicine

## 2024-08-23 VITALS — BP 130/60 | HR 64 | Ht 63.0 in | Wt 163.0 lb

## 2024-08-23 DIAGNOSIS — R700 Elevated erythrocyte sedimentation rate: Secondary | ICD-10-CM

## 2024-08-23 DIAGNOSIS — M7918 Myalgia, other site: Secondary | ICD-10-CM | POA: Diagnosis not present

## 2024-08-26 ENCOUNTER — Ambulatory Visit: Payer: Self-pay

## 2024-08-26 ENCOUNTER — Telehealth: Payer: Self-pay | Admitting: Internal Medicine

## 2024-08-26 MED ORDER — METHYLPREDNISOLONE 4 MG PO TABS: ORAL_TABLET | ORAL | 0 refills | Status: DC

## 2024-08-26 MED ORDER — HYDROCODONE-ACETAMINOPHEN 10-325 MG PO TABS: ORAL_TABLET | ORAL | 0 refills | Status: DC

## 2024-08-26 NOTE — Telephone Encounter (Signed)
 FYI Only or Action Required?: Action required by provider: medication is not at pharmacy.  Patient was last seen in primary care on 08/23/2024 by Perri Ronal PARAS, MD.  Called Nurse Triage reporting Medication Problem.  Symptoms began several days ago.  Interventions attempted: Prescription medications: Meloxicam , flexaril.  Symptoms are: unchanged.  Triage Disposition: Call PCP Now  Patient/caregiver understands and will follow disposition?: Yes   Encounter in pt calls folder.  Copied from CRM [id number]. Topic: Clinical - Medication Refill >> Aug 26, 2024 12:16 PM Kendralyn S wrote: Medication: prednisone    Has the patient contacted their pharmacy? Yes (Agent: If no, request that the patient contact the pharmacy for the refill. If patient does not wish to contact the pharmacy document the reason why and proceed with request.) (Agent: If yes, when and what did the pharmacy advise?)   This is the patient's preferred pharmacy:  [id number] - [place], [place] - [address] AT [address] [facility] [address] & [address] [place] [address] Phone: [phone] Fax: [phone]   Is this the correct pharmacy for this prescription? Yes If no, delete pharmacy and type the correct one.    Has the prescription been filled recently? No   Is the patient out of the medication? Yes   Has the patient been seen for an appointment in the last year OR does the patient have an upcoming appointment? Yes   Can we respond through MyChart? Yes   Agent: Please be advised that Rx refills may take up to 3 business days. We ask that you follow-up with your pharmacy.   Reason for Disposition  [1] Prescription not at pharmacy AND [2] was prescribed by doctor (or NP/PA) recently  (Exception: [facility] has access to EMR and prescription is recorded there. Go to Home Care and confirm prescription for pharmacy.)  Answer Assessment - Initial Assessment Questions Evaluated on  08/23/24, new rx for Medrol  Dosepak and            Hydrocodone -acetaminophen    Are not at [address]  Protocols used: Medication Refill and Renewal Call-A-AH

## 2024-08-26 NOTE — Telephone Encounter (Signed)
 Unable to convert crm from patient calls folder to nurse triage encounter. See nurse triage today for details of call. Signing to close.

## 2024-08-26 NOTE — Telephone Encounter (Signed)
 Copied from CRM [id number]. Topic: Clinical - Medication Refill >> Aug 26, 2024 12:16 PM Kendralyn S wrote: Medication: prednisone   Has the patient contacted their pharmacy? Yes (Agent: If no, request that the patient contact the pharmacy for the refill. If patient does not wish to contact the pharmacy document the reason why and proceed with request.) (Agent: If yes, when and what did the pharmacy advise?)  This is the patient's preferred pharmacy:  [id number] - [place], [place] - [address] AT [address] [place]  [address] Phone: [phone] Fax: [phone]  Is this the correct pharmacy for this prescription? Yes If no, delete pharmacy and type the correct one.   Has the prescription been filled recently? No  Is the patient out of the medication? Yes  Has the patient been seen for an appointment in the last year OR does the patient have an upcoming appointment? Yes  Can we respond through MyChart? Yes  Agent: Please be advised that Rx refills may take up to 3 business days. We ask that you follow-up with your pharmacy.

## 2024-08-27 ENCOUNTER — Encounter: Payer: Self-pay | Admitting: Internal Medicine

## 2024-08-27 NOTE — Patient Instructions (Addendum)
 Labs are indicative of inflammatory state. Consider PMR. Also has positive RF and CRP 76.4. Placing her on medrol  4 mg 12 day taper and Norco 10/325 to take sparingly.

## 2024-08-28 ENCOUNTER — Telehealth: Payer: Self-pay | Admitting: Internal Medicine

## 2024-08-28 NOTE — Telephone Encounter (Signed)
 Pt is scheduled 09/19/24 at 2:40pm

## 2024-08-28 NOTE — Telephone Encounter (Signed)
 Thanks for reaching out and I'll ask our staff to get her worked in earlier. Since new policy has told providers to not screen in any way, referrals themselves are not getting in front of me to look at urgency/appropriateness before scheduling them. So feel free to send a message or note over about patients who need consideration.

## 2024-08-28 NOTE — Telephone Encounter (Signed)
-----   Message from Lonni ORN [facility] sent at 08/28/2024  8:02 AM EST ----- Please try to get Ms. Hoopingarner scheduled earlier as a work-in. If nothing works out for that in the next month we can overbook her on a Thursday sometime. Thanks.

## 2024-08-30 ENCOUNTER — Encounter: Payer: Self-pay | Admitting: Internal Medicine

## 2024-09-02 ENCOUNTER — Ambulatory Visit: Admitting: Diagnostic Neuroimaging

## 2024-09-03 ENCOUNTER — Other Ambulatory Visit: Payer: Self-pay | Admitting: Internal Medicine

## 2024-09-12 NOTE — Telephone Encounter (Signed)
 done

## 2024-09-19 ENCOUNTER — Ambulatory Visit: Admitting: Internal Medicine

## 2024-09-19 NOTE — Progress Notes (Deleted)
 Office Visit Note  Patient: Courtney Ryan             Date of Birth: April 20, 1946           MRN: [id number]             PCP: Perri Ronal PARAS, MD Referring: Perri Ronal PARAS, MD Visit Date: 09/19/2024 Occupation: Data Unavailable  Subjective:  No chief complaint on file.   History of Present Illness: Courtney Ryan is a 78 y.o. female ***     Activities of Daily Living:  Patient reports morning stiffness for *** {minute/hour:19697}.   Patient {ACTIONS;DENIES/REPORTS:21021675::Denies} nocturnal pain.  Difficulty dressing/grooming: {ACTIONS;DENIES/REPORTS:21021675::Denies} Difficulty climbing stairs: {ACTIONS;DENIES/REPORTS:21021675::Denies} Difficulty getting out of chair: {ACTIONS;DENIES/REPORTS:21021675::Denies} Difficulty using hands for taps, buttons, cutlery, and/or writing: {ACTIONS;DENIES/REPORTS:21021675::Denies}  No Rheumatology ROS completed.   PMFS History:  Patient Active Problem List   Diagnosis Date Noted   Peripheral neuropathy 05/02/2024   Neuropathic pain 05/02/2024   Trigger finger of right thumb 12/21/2016   Intramural and subserous leiomyoma of uterus 10/01/2016   Deviated nasal septum 12/09/2015   Sinusitis, chronic 11/29/2015   Controlled diabetes mellitus type II without complication (HCC) 05/26/2014   Dependent edema 04/24/2014   Hyperlipidemia 06/16/2011   Hypothyroidism 06/16/2011   Osteoarthritis 06/16/2011   Goiter 06/16/2011   Fibrocystic breast disease 06/16/2011   Osteopenia 06/16/2011   Vitamin D  deficiency 06/16/2011   Hypertension 06/16/2011   Migraine headache 06/16/2011    Past Medical History:  Diagnosis Date   Anxiety    takes xanax  as needed   Arthritis    bottom of spine and end of fingers   Bursitis of both knees    Bursitis of left hip    Cancer (HCC) 01/31/2022   Skin Cancer - Basal Cell Carcinoma   COPD (chronic obstructive pulmonary disease) (HCC)    test done 15 years ago had the beginnings of COPD    Depression    Fibrocystic breast disease    Goiter    Hyperlipidemia    Hypertension 2008   Dr. Perri   Osteopenia    Polyp in anterior nares    just in Rt nostral   Recurrent upper respiratory infection (URI)    Vitamin D  deficiency     Family History  Problem Relation Age of Onset   Liver cancer Mother    Breast cancer Neg Hx    Past Surgical History:  Procedure Laterality Date   MOHS SURGERY  03/03/2022   nose   OOPHORECTOMY     OVARIAN CYST REMOVAL     PARATHYROIDECTOMY N/A 03/07/2019   Procedure: RIGHT INFERIOR PARATHYROIDECTOMY;  Surgeon: Eletha Boas, MD;  Location: [facility];  Service: General;  Laterality: N/A;   Social History[1] Social History   Social History Narrative   Social history: She is a widow.  Husband died several months ago of esophageal cancer.  She smokes about 5 cigarettes daily and drinks a couple glasses of wine a week.  Has good friends and neighbors.       Family history: Father died at age 11 of lung cancer.  Mother died at age 44 with cancer of the liver in 1963     Immunization History  Administered Date(s) Administered   Fluad Quad(high Dose 65+) 07/28/2023   Influenza,inj,Quad PF,6+ Mos 05/21/2019, 07/08/2020, 10/07/2022   PFIZER(Purple Top)SARS-COV-2 Vaccination 11/24/2019, 12/17/2019, 08/01/2020   PNEUMOCOCCAL CONJUGATE-20 03/24/2022   Pneumococcal Polysaccharide-23 07/12/2024   Tdap 04/02/2002, 06/25/2012, 01/16/2023     Objective: Vital Signs: There  were no vitals taken for this visit.   Physical Exam   Musculoskeletal Exam: ***   Investigation: No additional findings.  Imaging: No results found.  Recent Labs: Lab Results  Component Value Date   WBC 9.6 06/24/2024   HGB 12.9 06/24/2024   PLT 285 06/24/2024   NA 142 05/02/2024   K 4.6 05/02/2024   CL 104 05/02/2024   CO2 19 (L) 05/02/2024   GLUCOSE 90 05/02/2024   BUN 21 05/02/2024   CREATININE 0.98 05/02/2024   BILITOT <0.2 05/02/2024   ALKPHOS 98  05/02/2024   AST 13 05/02/2024   ALT 11 05/02/2024   PROT 7.0 05/02/2024   ALBUMIN 4.1 05/02/2024   CALCIUM  9.6 05/02/2024   GFRAA 58 (L) 03/22/2021    Speciality Comments: No specialty comments available.  Procedures:  No procedures performed Allergies: Penicillins, Sulfa antibiotics, Gabapentin , Levofloxacin , Other, Ceftriaxone , Clindamycin /lincomycin, Dust mite extract, Levofloxacin  in d5w, Molds & smuts, and Ofloxacin    Assessment / Plan:     Visit Diagnoses:  Assessment & Plan  ***  Follow-Up Instructions: No follow-ups on file.   Lonni LELON Ester, MD  Note - This record has been created using [employer].  Chart creation errors have been sought, but may not always  have been located. Such creation errors do not reflect on  the standard of medical care.    [1]  Social History Tobacco Use   Smoking status: Former    Current packs/day: 0.00    Average packs/day: 0.4 packs/day    Types: Cigarettes    Quit date: 10/03/2018    Years since quitting: 5.9   Smokeless tobacco: Never  Vaping Use   Vaping status: Never Used  Substance Use Topics   Alcohol use: Yes    Comment: occationally   Drug use: Never

## 2024-09-20 ENCOUNTER — Ambulatory Visit: Attending: Internal Medicine | Admitting: Internal Medicine

## 2024-09-20 ENCOUNTER — Encounter: Payer: Self-pay | Admitting: Internal Medicine

## 2024-09-20 VITALS — BP 96/50 | HR 72 | Temp 97.6°F | Ht 63.25 in | Wt 157.6 lb

## 2024-09-20 DIAGNOSIS — M353 Polymyalgia rheumatica: Secondary | ICD-10-CM | POA: Diagnosis not present

## 2024-09-20 DIAGNOSIS — M25551 Pain in right hip: Secondary | ICD-10-CM

## 2024-09-20 DIAGNOSIS — M545 Low back pain, unspecified: Secondary | ICD-10-CM | POA: Diagnosis not present

## 2024-09-20 DIAGNOSIS — Z23 Encounter for immunization: Secondary | ICD-10-CM | POA: Insufficient documentation

## 2024-09-20 DIAGNOSIS — M150 Primary generalized (osteo)arthritis: Secondary | ICD-10-CM

## 2024-09-20 DIAGNOSIS — G8929 Other chronic pain: Secondary | ICD-10-CM | POA: Diagnosis not present

## 2024-09-20 DIAGNOSIS — M25512 Pain in left shoulder: Secondary | ICD-10-CM | POA: Diagnosis not present

## 2024-09-20 DIAGNOSIS — R7689 Other specified abnormal immunological findings in serum: Secondary | ICD-10-CM | POA: Diagnosis not present

## 2024-09-20 DIAGNOSIS — M25511 Pain in right shoulder: Secondary | ICD-10-CM

## 2024-09-20 DIAGNOSIS — M25552 Pain in left hip: Secondary | ICD-10-CM

## 2024-09-20 DIAGNOSIS — R899 Unspecified abnormal finding in specimens from other organs, systems and tissues: Secondary | ICD-10-CM | POA: Insufficient documentation

## 2024-09-20 MED ORDER — HYDROCODONE-ACETAMINOPHEN 10-325 MG PO TABS: ORAL_TABLET | ORAL | 0 refills | Status: DC

## 2024-09-20 MED ORDER — PREDNISONE 10 MG PO TABS: ORAL_TABLET | ORAL | 0 refills | Status: AC

## 2024-09-20 NOTE — Assessment & Plan Note (Addendum)
 Management complicated by severe pain limiting daily activities. Previous use of oxycodone  for pain relief. Short-term use of narcotics is acceptable while further evaluation is ongoing. - Prescribed oxycodone  for short-term pain management, with instructions to use as previously directed. - Discussed risks of long-term narcotic use and plan to transition to targeted treatment once diagnosis is confirmed.  Orders:   HYDROcodone -acetaminophen  (NORCO) 10-325 MG tablet; One half to one tab with food every 12 hours as needed for severe musculoskeletal pain

## 2024-09-20 NOTE — Assessment & Plan Note (Addendum)
 Severe pain and stiffness in hips, spine, shoulders, neck, and arm. Positive rheumatoid factor, elevated sedimentation rate, and C-reactive protein suggest inflammation. 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 Steroid responsiveness is a key diagnostic indicator. - Ordered blood tests to assess inflammation markers and confirm diagnosis. - Ordered x-rays of shoulders, hips, and part of the back to rule out structural abnormalities. - Prescribed oral prednisone  20 mg daily for at least one week, then taper to 10 mg if symptoms improve. - Scheduled follow-up appointment in early January to assess response to treatment and review x-ray and lab results. Orders:   Sedimentation rate   C-reactive protein   Cyclic citrul peptide antibody, IgG   Protein Electrophoresis, (serum)   HYDROcodone -acetaminophen  (NORCO) 10-325 MG tablet; One half to one tab with food every 12 hours as needed for severe musculoskeletal pain   predniSONE  (DELTASONE ) 10 MG tablet; Take 2 tablets (20 mg total) by mouth daily with breakfast for 7 days, THEN 1 tablet (10 mg total) daily with breakfast for 14 days.

## 2024-09-20 NOTE — Progress Notes (Signed)
 "  Office Visit Note  Patient: Courtney Ryan             Date of Birth: [birth year]           MRN: [id number]             PCP: Perri Ronal PARAS, MD Referring: Perri Ronal PARAS, MD Visit Date: 09/20/2024 Occupation: Data Unavailable  Subjective:  New Patient (Initial Visit) (Muscular and joint pain )   Discussed the use of AI scribe software for clinical note transcription with the patient, who gave verbal consent to proceed.  History of Present Illness   Courtney Ryan is an 78 year old female who presents with significant pain and stiffness, possibly due to rheumatoid arthritis based on lab result with positive rheumatoid factor and elevated ESR and CRP.  She has been experiencing sudden and severe pain and stiffness that began earlier this year, affecting her hips, spine, shoulders, neck, and arm. The pain is persistent and has not improved significantly with treatment. Initially, she took eight aspirins a day for two weeks without relief. Her primary care doctor then prescribed Medrol  (methylprednisolone ), which she took at a double dose, providing only slight improvement. Despite this, she still experiences significant pain.  She reports difficulty with mobility, stating that she cannot bend down, reach, or walk easily. The pain is constant and does not vary significantly throughout the day, although she feels more comfortable when sitting and not moving. Swelling in her feet and ankles improved while on prednisone . No major joint injuries or fractures apart from a fall in 1976. She experiences postnasal drip and occasional lumps or swelling in her neck.  She has a history of neuropathy and was previously prescribed gabapentin , which she took for two months. She stopped taking it after suspecting it might be causing her current symptoms, although this was not confirmed. She also has a history of a fall in 1976, which resulted in a bruised area that was later diagnosed as arthritis by an orthopedic  doctor, but this has not been a significant issue recently.  Her current medications include oxycodone , which she takes as needed for pain, typically a half dose at night and a full dose in the morning to manage her symptoms and maintain functionality. She smokes intermittently, about a couple of times a week.       Activities of Daily Living:  Patient reports morning stiffness for all day.   Patient Denies nocturnal pain.  Difficulty dressing/grooming: Denies Difficulty climbing stairs: Reports Difficulty getting out of chair: Reports Difficulty using hands for taps, buttons, cutlery, and/or writing: Reports  Review of Systems  Constitutional:  Positive for fatigue.  HENT:  Positive for mouth dryness. Negative for mouth sores.   Eyes:  Positive for dryness.  Respiratory:  Positive for shortness of breath.   Cardiovascular:  Negative for chest pain and palpitations.  Gastrointestinal:  Positive for diarrhea. Negative for blood in stool and constipation.  Endocrine: Negative for increased urination.  Genitourinary:  Negative for involuntary urination.  Musculoskeletal:  Positive for joint pain, joint pain, myalgias, muscle weakness, morning stiffness, muscle tenderness and myalgias. Negative for gait problem and joint swelling.  Skin:  Positive for sensitivity to sunlight. Negative for color change, rash and hair loss.  Allergic/Immunologic: Negative for susceptible to infections.  Neurological:  Negative for dizziness and headaches.  Hematological:  Positive for swollen glands.  Psychiatric/Behavioral:  Positive for depressed mood. Negative for sleep disturbance. The patient is nervous/anxious.  PMFS History:  Patient Active Problem List   Diagnosis Date Noted   Peripheral neuropathy 05/02/2024   Neuropathic pain 05/02/2024   Trigger finger of right thumb 12/21/2016   Intramural and subserous leiomyoma of uterus 10/01/2016   Deviated nasal septum 12/09/2015   Sinusitis,  chronic 11/29/2015   Controlled diabetes mellitus type II without complication (HCC) 05/26/2014   Dependent edema 04/24/2014   Hyperlipidemia 06/16/2011   Hypothyroidism 06/16/2011   Osteoarthritis 06/16/2011   Goiter 06/16/2011   Fibrocystic breast disease 06/16/2011   Osteopenia 06/16/2011   Vitamin D  deficiency 06/16/2011   Hypertension 06/16/2011   Migraine headache 06/16/2011    Past Medical History:  Diagnosis Date   Anxiety    takes xanax  as needed   Arthritis    bottom of spine and end of fingers   Bursitis of both knees    Bursitis of left hip    Cancer (HCC) 01/31/2022   Skin Cancer - Basal Cell Carcinoma   COPD (chronic obstructive pulmonary disease) (HCC)    test done 15 years ago had the beginnings of COPD   Depression    Fibrocystic breast disease    Goiter    Hyperlipidemia    Hypertension 2008   Dr. Perri   Osteopenia    Polyp in anterior nares    just in Rt nostral   Recurrent upper respiratory infection (URI)    Vitamin D  deficiency     Family History  Problem Relation Age of Onset   Liver cancer Mother    Heart failure Father    Heart failure Brother    Breast cancer Neg Hx    Past Surgical History:  Procedure Laterality Date   MOHS SURGERY  03/03/2022   nose   MOLE REMOVAL Left    OOPHORECTOMY     OVARIAN CYST REMOVAL     PARATHYROIDECTOMY N/A 03/07/2019   Procedure: RIGHT INFERIOR PARATHYROIDECTOMY;  Surgeon: Eletha Boas, MD;  Location: [facility];  Service: General;  Laterality: N/A;   Social History[1] Social History   Social History Narrative   Social history: She is a widow.  Husband died several months ago of esophageal cancer.  She smokes about 5 cigarettes daily and drinks a couple glasses of wine a week.  Has good friends and neighbors.       Family history: Father died at age 4 of lung cancer.  Mother died at age 68 with cancer of the liver in 1963     Immunization History  Administered Date(s) Administered   Fluad  Quad(high Dose 65+) 07/28/2023   Influenza,inj,Quad PF,6+ Mos 05/21/2019, 07/08/2020, 10/07/2022   PFIZER(Purple Top)SARS-COV-2 Vaccination 11/24/2019, 12/17/2019, 08/01/2020   PNEUMOCOCCAL CONJUGATE-20 03/24/2022   Pneumococcal Polysaccharide-23 07/12/2024   Tdap 04/02/2002, 06/25/2012, 01/16/2023     Objective: Vital Signs: BP (!) 96/50 (BP Location: Left Arm, Patient Position: Sitting, Cuff Size: Normal)   Pulse 72   Temp 97.6 F (36.4 C)   Ht 5' 3.25 (1.607 m)   Wt 157 lb 9.6 oz (71.5 kg)   BMI 27.70 kg/m    Physical Exam Eyes:     Conjunctiva/sclera: Conjunctivae normal.  Cardiovascular:     Rate and Rhythm: Normal rate and regular rhythm.  Pulmonary:     Effort: Pulmonary effort is normal.     Breath sounds: Normal breath sounds.  Lymphadenopathy:     Cervical: No cervical adenopathy.  Skin:    General: Skin is warm and dry.     Findings: Rash present.  Comments: Central facial erythema and telangiectasias Superficial varicose veins b/l legs and feet  Neurological:     Mental Status: She is alert.  Psychiatric:        Mood and Affect: Mood normal.      Musculoskeletal Exam:  Shoulders full passive ROM, painful more with active movement, strength 5/5 b/l Elbows full ROM no tenderness or swelling Wrists full ROM no tenderness or swelling Fingers severe 1st CMC squaring and partially reducible MCP subluxation and hyperextension b/l, DIP heberdon's nodes Mild alteral hip tenderness to pressure, no pain with resisted flexion Knees full ROM no tenderness or swelling, patellofemoral crepitus MTPs full ROM no tenderness or swelling, bunions, negative MTP squeeze tenderness     Investigation: No additional findings.  Imaging: No results found.  Recent Labs: Lab Results  Component Value Date   WBC 9.6 06/24/2024   HGB 12.9 06/24/2024   PLT 285 06/24/2024   NA 142 05/02/2024   K 4.6 05/02/2024   CL 104 05/02/2024   CO2 19 (L) 05/02/2024   GLUCOSE 90  05/02/2024   BUN 21 05/02/2024   CREATININE 0.98 05/02/2024   BILITOT <0.2 05/02/2024   ALKPHOS 98 05/02/2024   AST 13 05/02/2024   ALT 11 05/02/2024   PROT 7.0 05/02/2024   ALBUMIN 4.1 05/02/2024   CALCIUM  9.6 05/02/2024   GFRAA 58 (L) 03/22/2021    Speciality Comments: No specialty comments available.  Procedures:  No procedures performed Allergies: Penicillins, Sulfa antibiotics, Gabapentin , Levofloxacin , Other, Ceftriaxone , Clindamycin /lincomycin, Dust mite extract, Levofloxacin  in d5w, Molds & smuts, and Ofloxacin    Assessment / Plan:     Visit Diagnoses:  Assessment & Plan PMR (polymyalgia rheumatica) Severe pain and stiffness in hips, spine, shoulders, neck, and arm. Positive rheumatoid factor, elevated sedimentation rate, and C-reactive protein suggest inflammation. 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 Steroid responsiveness is a key diagnostic indicator. - Ordered blood tests to assess inflammation markers and confirm diagnosis. - Ordered x-rays of shoulders, hips, and part of the back to rule out structural abnormalities. - Prescribed oral prednisone  20 mg daily for at least one week, then taper to 10 mg if symptoms improve. - Scheduled follow-up appointment in early January to assess response to treatment and review x-ray and lab results. Orders:   Sedimentation rate   C-reactive protein   Cyclic citrul peptide antibody, IgG   Protein Electrophoresis, (serum)   HYDROcodone -acetaminophen  (NORCO) 10-325 MG tablet; One half to one tab with food every 12 hours as needed for severe musculoskeletal pain   predniSONE  (DELTASONE ) 10 MG tablet; Take 2 tablets (20 mg total) by mouth daily with breakfast for 7 days, THEN 1 tablet (10 mg total) daily with breakfast for 14 days.  Primary osteoarthritis involving multiple joints Management complicated by severe pain limiting daily activities. Previous use of oxycodone  for pain relief.  Short-term use of narcotics is acceptable while further evaluation is ongoing. - Prescribed oxycodone  for short-term pain management, with instructions to use as previously directed. - Discussed risks of long-term narcotic use and plan to transition to targeted treatment once diagnosis is confirmed.  Orders:   HYDROcodone -acetaminophen  (NORCO) 10-325 MG tablet; One half to one tab with food every 12 hours as needed for severe musculoskeletal pain  Rheumatoid factor positive Top differential would also be to consider new onset of seropositive rheumatoid arthritis which can easily mimic PMR in initial presentation.  There is no peripheral joint synovitis to confirm this by clinical criteria.  Also checking for  CCP antibody as above which would be more specific marker.  Otherwise getting baseline chest x-ray without testing rule out underlying cause such as nodular/ILD disease process or less likely occult malignancy. Orders:   DG Chest 2 View; Future  Abnormal laboratory test result  Orders:   DG Chest 2 View; Future  Chronic pain of both shoulders Obtaining plain x-rays bilateral shoulder joints to check for degree of primary osteoarthritis as alternate potential cause for the pain mobility restriction. Orders:   DG Shoulder Left; Future   DG Shoulder Right; Future  Bilateral hip pain Updating plain x-rays evaluate for degree of primary osteoarthritis as alternate cause for the degree of pain and mobility restriction. Orders:   DG HIPS BILAT WITH PELVIS 3-4 VIEWS; Future  Chronic bilateral low back pain without sciatica  Orders:   DG Lumbar Spine 2-3 Views; Future  Need for prophylactic vaccination with Streptococcus pneumoniae (Pneumococcus) and Influenza vaccines Previous lack of antibodies to pneumonia vaccine suggests potential immune dysfunction. Further evaluation needed to assess immune response and function. - Will review previous allergist's notes and assessment regarding  immune response to pneumonia vaccine. - Will consider rechecking antibody levels if indicated by allergist's notes. Orders:   Strep pneumoniae 23 Serotypes IgG      Follow-Up Instructions: No follow-ups on file.   Lonni LELON Ester, MD  Note - This record has been created using [employer].  Chart creation errors have been sought, but may not always  have been located. Such creation errors do not reflect on  the standard of medical care.      [1]  Social History Tobacco Use   Smoking status: Some Days    Current packs/day: 0.00    Average packs/day: 0.4 packs/day    Types: Cigarettes    Last attempt to quit: 10/03/2018    Years since quitting: 5.9    Passive exposure: Never   Smokeless tobacco: Never   Tobacco comments:    Every now an then she still smokes  Vaping Use   Vaping status: Never Used  Substance Use Topics   Alcohol use: Yes    Comment: occationally   Drug use: Never   "

## 2024-09-20 NOTE — Assessment & Plan Note (Addendum)
  Orders:   DG Chest 2 View; Future

## 2024-09-20 NOTE — Assessment & Plan Note (Addendum)
 Top differential would also be to consider new onset of seropositive rheumatoid arthritis which can easily mimic PMR in initial presentation.  There is no peripheral joint synovitis to confirm this by clinical criteria.  Also checking for CCP antibody as above which would be more specific marker.  Otherwise getting baseline chest x-ray without testing rule out underlying cause such as nodular/ILD disease process or less likely occult malignancy. Orders:   DG Chest 2 View; Future

## 2024-09-23 ENCOUNTER — Ambulatory Visit
Admission: RE | Admit: 2024-09-23 | Discharge: 2024-09-23 | Disposition: A | Source: Ambulatory Visit | Attending: Internal Medicine

## 2024-09-23 ENCOUNTER — Ambulatory Visit (HOSPITAL_COMMUNITY)

## 2024-09-23 DIAGNOSIS — R7689 Other specified abnormal immunological findings in serum: Secondary | ICD-10-CM

## 2024-09-23 DIAGNOSIS — G8929 Other chronic pain: Secondary | ICD-10-CM

## 2024-09-23 DIAGNOSIS — M25552 Pain in left hip: Secondary | ICD-10-CM

## 2024-09-23 DIAGNOSIS — R899 Unspecified abnormal finding in specimens from other organs, systems and tissues: Secondary | ICD-10-CM

## 2024-09-24 ENCOUNTER — Ambulatory Visit: Payer: Self-pay | Admitting: Internal Medicine

## 2024-09-24 NOTE — Progress Notes (Signed)
 Xrays show mild osteoarthritis in multiple places not out of the ordinary for age related changes. There is some degenerative disc changes in the low back particularly L4-L5. Lab tests for inflammation are highly positive and so is her other antibody test for rheumatoid arthritis. Recommend to continue trying the prednisone  treatment as planned for now. We can also discuss at follow up next month.

## 2024-09-27 LAB — STREP PNEUMONIAE 23 SEROTYPES IGG
Serotype 17 (17F): <0.3
Serotype 2 (2): 3.3
Serotype 20 (20): 4.2
Serotype 22 (22F): 67.7
Serotype 34 (10A): 0.5
Serotype 43 (11A): <0.3
Serotype 54 (15B): 0.6
Serotype 57 (19A): 33.6
Serotype 70 (33F): 0.4
Strep pneumo Type 12: <0.3
Strep pneumo Type 19: 2.9
Strep pneumo Type 4: 19.9
Strep pneumo Type 9: 9.1
Strep pneumoniae Type 1 Abs: 0.5
Strep pneumoniae Type 14 Abs: 15.9
Strep pneumoniae Type 18C Abs: 5.8
Strep pneumoniae Type 23F Abs: 0.5
Strep pneumoniae Type 3 Abs: 12.2
Strep pneumoniae Type 5 Abs: 1
Strep pneumoniae Type 6B Abs: <0.3
Strep pneumoniae Type 7F Abs: 6.2
Strep pneumoniae Type 8 Abs: 0.4
Strep pneumoniae Type 9N Abs: 9.7

## 2024-09-27 LAB — PROTEIN ELECTROPHORESIS, SERUM
Albumin ELP: 3.1 g/dL — ABNORMAL LOW (ref 3.8–4.8)
Alpha 1: 0.5 g/dL — ABNORMAL HIGH (ref 0.2–0.3)
Alpha 2: 1.3 g/dL — ABNORMAL HIGH (ref 0.5–0.9)
Beta 2: 0.4 g/dL (ref 0.2–0.5)
Beta Globulin: 0.5 g/dL (ref 0.4–0.6)
Gamma Globulin: 0.9 g/dL (ref 0.8–1.7)
Total Protein: 6.8 g/dL (ref 6.1–8.1)

## 2024-09-27 LAB — SEDIMENTATION RATE: Sed Rate: >130 mm/h — ABNORMAL HIGH (ref 0–30)

## 2024-09-27 LAB — C-REACTIVE PROTEIN: CRP: 104 mg/L — ABNORMAL HIGH

## 2024-09-27 LAB — CYCLIC CITRUL PEPTIDE ANTIBODY, IGG: Cyclic Citrullin Peptide Ab: >250 U — ABNORMAL HIGH

## 2024-09-30 NOTE — Progress Notes (Signed)
 "  Office Visit Note  Patient: Courtney Ryan             Date of Birth: 10/07/1945           MRN: [id number]             Visit Date: 10/08/2024  PCP: Perri Ronal PARAS, MD   Subjective:   Chief Complaint: Rheumatoid Arthritis (Follow up from labs.)  History of Present Illness: Courtney Ryan is a 78 y.o. female  with PMH of hypertension, DM2, and peripheral neuropathy who returns today for follow up of newly diagnosed seropositive rheumatoid arthritis. She is feeling better today than she was at her last visit. She still endorses pain with movement in her shoulders but it has improved. Denies pain or swelling in her fingers, toes, ankles, or wrists. She is currently still finishing the prednisone  taper prescribed at her last visit and depicts marked improvement in her symptoms. She is currently tolerating the prednisone  without any side effects. She has only had to take 4 tablets of hydrocodone  since her last visit. She inquires about her lab results and prognosis. Her x-rays from her last appointment showed mild osteoarthritis but no evidence of erosive damage.   Previous Medication Trials: prednisone   Last Labs: Sed rate >130, CRP 104, CCP >250, Protein Electrophoresis WNL.  Review of Systems: Review of Systems  Constitutional:  Positive for fatigue.  HENT:  Positive for mouth dryness. Negative for mouth sores.   Eyes:  Positive for dryness.  Respiratory:  Negative for shortness of breath.   Cardiovascular:  Positive for palpitations. Negative for chest pain.  Gastrointestinal:  Negative for blood in stool, constipation and diarrhea.  Endocrine: Negative for increased urination.  Genitourinary:  Negative for involuntary urination.  Musculoskeletal:  Positive for joint pain, gait problem, joint pain, myalgias, muscle weakness, muscle tenderness and myalgias. Negative for joint swelling and morning stiffness.  Skin:  Negative for color change, rash, hair loss and sensitivity to sunlight.   Allergic/Immunologic: Negative for susceptible to infections.  Neurological:  Negative for dizziness and headaches.  Hematological:  Positive for swollen glands.  Psychiatric/Behavioral:  Positive for depressed mood. Negative for sleep disturbance. The patient is nervous/anxious.    Objective: Vital Signs: BP (!) 143/66   Pulse 69   Temp 98 F (36.7 C)   Resp 14   Ht 5' 3.25 (1.607 m)   Wt 158 lb 12.8 oz (72 kg)   BMI 27.91 kg/m   Physical Exam Vitals reviewed.  Constitutional:      General: She is not in acute distress.    Appearance: Normal appearance. She is well-developed and normal weight.  HENT:     Head: Normocephalic and atraumatic. No right periorbital erythema or left periorbital erythema.     Mouth/Throat:     Mouth: Mucous membranes are moist.     Pharynx: Oropharynx is clear.  Eyes:     General: Lids are normal. No scleral icterus.       Right eye: No discharge.        Left eye: No discharge.     Extraocular Movements: Extraocular movements intact.     Conjunctiva/sclera: Conjunctivae normal.     Pupils: Pupils are equal, round, and reactive to light.  Cardiovascular:     Rate and Rhythm: Normal rate and regular rhythm.     Heart sounds: Normal heart sounds. No murmur heard.    No friction rub. No gallop.  Pulmonary:     Effort: Pulmonary effort  is normal. No respiratory distress.     Breath sounds: Normal breath sounds. No stridor. No wheezing, rhonchi or rales.  Chest:     Chest wall: No deformity.  Musculoskeletal:     Right lower leg: No edema.     Left lower leg: No edema.     Comments: There is no tenderness or synovitis in MCPs, PIPs, DIPs, or MTPs. Heberdon's nodes present on DIPs. Negative MTP squeeze.There is no swelling, warmth, crepitus, or erythema. No malalignment, asymmetry, dislocation, or laxity. Normal gait. Normal tone. Muscle strength 5/5. Mild pain with movement of both shoulders but improved from last visit.   Lymphadenopathy:      Cervical: No cervical adenopathy.  Skin:    General: Skin is warm and moist.     Capillary Refill: Capillary refill takes less than 2 seconds.     Findings: Rash present.     Comments: Central facial erythema and telangiectasias noted.  Neurological:     Mental Status: She is alert.     Gait: Gait normal.  Psychiatric:        Mood and Affect: Mood normal.        Behavior: Behavior normal. Behavior is cooperative.    Problem List:  Patient Active Problem List   Diagnosis Date Noted   PMR (polymyalgia rheumatica) 09/20/2024   Rheumatoid factor positive 09/20/2024   Abnormal laboratory test result 09/20/2024   Need for prophylactic vaccination with Streptococcus pneumoniae (Pneumococcus) and Influenza vaccines 09/20/2024   Peripheral neuropathy 05/02/2024   Neuropathic pain 05/02/2024   Trigger finger of right thumb 12/21/2016   Intramural and subserous leiomyoma of uterus 10/01/2016   Deviated nasal septum 12/09/2015   Sinusitis, chronic 11/29/2015   Controlled diabetes mellitus type II without complication (HCC) 05/26/2014   Dependent edema 04/24/2014   Hyperlipidemia 06/16/2011   Hypothyroidism 06/16/2011   Osteoarthritis 06/16/2011   Goiter 06/16/2011   Fibrocystic breast disease 06/16/2011   Osteopenia 06/16/2011   Vitamin D  deficiency 06/16/2011   Hypertension 06/16/2011   Migraine headache 06/16/2011   PMFS History:  History: Past Medical History:  Diagnosis Date   Anxiety    takes xanax  as needed   Arthritis    bottom of spine and end of fingers   Bursitis of both knees    Bursitis of left hip    Cancer (HCC) 01/31/2022   Skin Cancer - Basal Cell Carcinoma   COPD (chronic obstructive pulmonary disease) (HCC)    test done 15 years ago had the beginnings of COPD   Depression    Fibrocystic breast disease    Goiter    Hyperlipidemia    Hypertension 2008   Dr. Perri   Osteopenia    Polyp in anterior nares    just in Rt nostral   Recurrent upper  respiratory infection (URI)    Vitamin D  deficiency     Family History  Problem Relation Age of Onset   Liver cancer Mother    Heart failure Father    Heart failure Brother    Breast cancer Neg Hx    Past Surgical History:  Procedure Laterality Date   MOHS SURGERY  03/03/2022   nose   MOLE REMOVAL Left    OOPHORECTOMY     OVARIAN CYST REMOVAL     PARATHYROIDECTOMY N/A 03/07/2019   Procedure: RIGHT INFERIOR PARATHYROIDECTOMY;  Surgeon: Eletha Boas, MD;  Location: [facility];  Service: General;  Laterality: N/A;   Social History   Social History Narrative  Social history: She is a widow.  Husband died several months ago of esophageal cancer.  She smokes about 5 cigarettes daily and drinks a couple glasses of wine a week.  Has good friends and neighbors.       Family history: Father died at age 25 of lung cancer.  Mother died at age 44 with cancer of the liver in 1963   Allergies: Penicillins, Sulfa antibiotics, Gabapentin , Levofloxacin , Other, Ceftriaxone , Clindamycin /lincomycin, Dust mite extract, Levofloxacin  in d5w, Molds & smuts, and Ofloxacin   Immunization status:  Immunization History  Administered Date(s) Administered   Fluad Quad(high Dose 65+) 07/28/2023   Influenza,inj,Quad PF,6+ Mos 05/21/2019, 07/08/2020, 10/07/2022   PFIZER(Purple Top)SARS-COV-2 Vaccination 11/24/2019, 12/17/2019, 08/01/2020   PNEUMOCOCCAL CONJUGATE-20 03/24/2022   Pneumococcal Polysaccharide-23 07/12/2024   Tdap 04/02/2002, 06/25/2012, 01/16/2023     Assessment:  Visit Diagnoses:   Seropositive rheumatoid arthritis (HCC) New onset seropositive rheumatoid arthritis positive RF, and Sed rate >130, CRP 104, CCP >250 in December. There is no peripheral joint synovitis present today. X-rays from her last appointment showed mild osteoarthritis but no evidence of erosive damage. Stiffness and pain has improved with second course of prednisone . She is still finishing her prednisone  taper that we  prescribed her in December. She can go ahead and finish that this week and start methotrexate  next week. She is getting her flu shot tomorrow. Patient is agreeable with this plan. Risks, benefits, and alternatives to methotrexate  discussed today and patient expressed understanding. Discussed potential side effects, including nausea/vomiting, oral ulcers, hair loss, elevated LFTs, low blood counts, and immune suppression. Patient counseled about the importance of regular bloodwork monitoring, the need to supplement daily with folic acid , and alcohol avoidance. CBC and CMP were done in September at another office and were within normal limits. We will recheck labs in 6 weeks. -Start methotrexate  15 mg (6 tablets) weekly. -Start folate acid supplement daily.   Follow-Up Instructions:  Return in about 6 weeks (around 11/19/2024) for RA f/u.   Orders: Meds ordered this encounter  Medications   methotrexate  (RHEUMATREX) 2.5 MG tablet    Sig: Take 6 tablets (15 mg total) by mouth once a week. Caution:Chemotherapy. Protect from light.    Dispense:  30 tablet    Refill:  0   folic acid  (FOLVITE ) 1 MG tablet    Sig: Take 1 tablet (1 mg total) by mouth daily.    Dispense:  90 tablet    Refill:  0    Deaire Mcwhirter S Bunn, PA-C  Note - This record has been created using [employer]. Chart creation errors have been sought, but may not always have been located. Such creation errors do not reflect on the standard of medical care.  "

## 2024-10-02 NOTE — Assessment & Plan Note (Signed)
 Previous lack of antibodies to pneumonia vaccine suggests potential immune dysfunction. Further evaluation needed to assess immune response and function. - Will review previous allergist's notes and assessment regarding immune response to pneumonia vaccine. - Will consider rechecking antibody levels if indicated by allergist's notes. Orders:   Strep pneumoniae 23 Serotypes IgG

## 2024-10-08 ENCOUNTER — Ambulatory Visit

## 2024-10-08 VITALS — BP 143/66 | HR 69 | Temp 98.0°F | Resp 14 | Ht 63.25 in | Wt 158.8 lb

## 2024-10-08 DIAGNOSIS — M059 Rheumatoid arthritis with rheumatoid factor, unspecified: Secondary | ICD-10-CM | POA: Diagnosis not present

## 2024-10-08 MED ORDER — FOLIC ACID 1 MG PO TABS: 1.0000 mg | ORAL_TABLET | Freq: Every day | ORAL | 0 refills | Status: AC

## 2024-10-08 MED ORDER — METHOTREXATE SODIUM 2.5 MG PO TABS: 15.0000 mg | ORAL_TABLET | ORAL | 0 refills | Status: AC

## 2024-10-08 NOTE — Patient Instructions (Addendum)
 [name]

## 2024-10-28 ENCOUNTER — Other Ambulatory Visit: Payer: Self-pay | Admitting: Internal Medicine

## 2024-10-31 ENCOUNTER — Other Ambulatory Visit: Payer: Self-pay

## 2024-10-31 DIAGNOSIS — M150 Primary generalized (osteo)arthritis: Secondary | ICD-10-CM

## 2024-10-31 DIAGNOSIS — M353 Polymyalgia rheumatica: Secondary | ICD-10-CM

## 2024-10-31 NOTE — Telephone Encounter (Signed)
 Patient contacted the office to request a refill of Hydrocodone  be sent to [facility] in [place].  Last Fill: 09/20/2024  Next Visit: 11/18/2024  Last Visit: 10/08/2024  Dx: not mentioned  Current Dose per office note on 10/08/2024: not mentioned  Okay to refill Hydrocodone -acetaminophen  10-325 mg?

## 2024-11-01 MED ORDER — HYDROCODONE-ACETAMINOPHEN 10-325 MG PO TABS: ORAL_TABLET | ORAL | 0 refills | Status: AC

## 2024-11-18 ENCOUNTER — Ambulatory Visit

## 2024-11-25 ENCOUNTER — Ambulatory Visit: Admitting: Adult Health

## 2024-12-10 ENCOUNTER — Ambulatory Visit: Admitting: Internal Medicine
